# Patient Record
Sex: Female | Born: 1935 | Race: White | Hispanic: No | Marital: Single | Smoking: Never smoker
Health system: Southern US, Community
[De-identification: ages and names within clinical notes are randomized; demographics above are authoritative.]

## PROBLEM LIST (undated history)

## (undated) DIAGNOSIS — E669 Obesity, unspecified: Secondary | ICD-10-CM

## (undated) DIAGNOSIS — N200 Calculus of kidney: Secondary | ICD-10-CM

## (undated) DIAGNOSIS — M8580 Other specified disorders of bone density and structure, unspecified site: Secondary | ICD-10-CM

## (undated) DIAGNOSIS — E785 Hyperlipidemia, unspecified: Secondary | ICD-10-CM

## (undated) DIAGNOSIS — M2140 Flat foot [pes planus] (acquired), unspecified foot: Secondary | ICD-10-CM

## (undated) DIAGNOSIS — F439 Reaction to severe stress, unspecified: Secondary | ICD-10-CM

## (undated) DIAGNOSIS — M898X9 Other specified disorders of bone, unspecified site: Secondary | ICD-10-CM

## (undated) HISTORY — PX: KNEE SURGERY: SHX244

## (undated) HISTORY — DX: Hyperlipidemia, unspecified: E78.5

## (undated) HISTORY — DX: Obesity, unspecified: E66.9

## (undated) HISTORY — DX: Calculus of kidney: N20.0

## (undated) HISTORY — DX: Other specified disorders of bone, unspecified site: M89.8X9

## (undated) HISTORY — DX: Reaction to severe stress, unspecified: F43.9

## (undated) HISTORY — DX: Flat foot (pes planus) (acquired), unspecified foot: M21.40

## (undated) HISTORY — DX: Other specified disorders of bone density and structure, unspecified site: M85.80

---

## 1997-06-02 ENCOUNTER — Other Ambulatory Visit: Admission: RE | Admit: 1997-06-02 | Discharge: 1997-06-02 | Payer: Self-pay | Admitting: Obstetrics & Gynecology

## 1998-11-26 ENCOUNTER — Emergency Department (HOSPITAL_COMMUNITY): Admission: EM | Admit: 1998-11-26 | Discharge: 1998-11-26 | Payer: Self-pay | Admitting: Emergency Medicine

## 1999-01-19 ENCOUNTER — Encounter: Admission: RE | Admit: 1999-01-19 | Discharge: 1999-01-19 | Payer: Self-pay | Admitting: Neurosurgery

## 1999-12-23 ENCOUNTER — Encounter: Admission: RE | Admit: 1999-12-23 | Discharge: 1999-12-23 | Payer: Self-pay | Admitting: Internal Medicine

## 1999-12-23 ENCOUNTER — Encounter: Payer: Self-pay | Admitting: Internal Medicine

## 2000-03-20 ENCOUNTER — Ambulatory Visit (HOSPITAL_COMMUNITY): Admission: AD | Admit: 2000-03-20 | Discharge: 2000-03-20 | Payer: Self-pay | Admitting: Cardiology

## 2000-08-01 ENCOUNTER — Encounter: Admission: RE | Admit: 2000-08-01 | Discharge: 2000-08-01 | Payer: Self-pay | Admitting: Emergency Medicine

## 2000-08-01 ENCOUNTER — Encounter: Payer: Self-pay | Admitting: Emergency Medicine

## 2000-08-03 ENCOUNTER — Ambulatory Visit (HOSPITAL_COMMUNITY): Admission: RE | Admit: 2000-08-03 | Discharge: 2000-08-03 | Payer: Self-pay | Admitting: Urology

## 2000-08-03 ENCOUNTER — Encounter: Payer: Self-pay | Admitting: Urology

## 2001-05-02 ENCOUNTER — Ambulatory Visit (HOSPITAL_COMMUNITY): Admission: RE | Admit: 2001-05-02 | Discharge: 2001-05-02 | Payer: Self-pay | Admitting: Obstetrics & Gynecology

## 2001-05-02 ENCOUNTER — Encounter: Payer: Self-pay | Admitting: Obstetrics & Gynecology

## 2001-12-25 ENCOUNTER — Encounter: Admission: RE | Admit: 2001-12-25 | Discharge: 2001-12-25 | Payer: Self-pay | Admitting: Emergency Medicine

## 2001-12-25 ENCOUNTER — Encounter: Payer: Self-pay | Admitting: Emergency Medicine

## 2002-01-28 ENCOUNTER — Encounter: Admission: RE | Admit: 2002-01-28 | Discharge: 2002-01-28 | Payer: Self-pay | Admitting: Orthopedic Surgery

## 2002-01-28 ENCOUNTER — Encounter: Payer: Self-pay | Admitting: Orthopedic Surgery

## 2002-02-22 ENCOUNTER — Ambulatory Visit (HOSPITAL_BASED_OUTPATIENT_CLINIC_OR_DEPARTMENT_OTHER): Admission: RE | Admit: 2002-02-22 | Discharge: 2002-02-22 | Payer: Self-pay | Admitting: Orthopedic Surgery

## 2002-10-31 ENCOUNTER — Encounter: Admission: RE | Admit: 2002-10-31 | Discharge: 2003-01-29 | Payer: Self-pay | Admitting: Emergency Medicine

## 2003-05-08 ENCOUNTER — Other Ambulatory Visit: Admission: RE | Admit: 2003-05-08 | Discharge: 2003-05-08 | Payer: Self-pay | Admitting: Obstetrics & Gynecology

## 2003-07-07 ENCOUNTER — Ambulatory Visit (HOSPITAL_COMMUNITY): Admission: RE | Admit: 2003-07-07 | Discharge: 2003-07-07 | Payer: Self-pay | Admitting: Gastroenterology

## 2003-07-07 ENCOUNTER — Encounter (INDEPENDENT_AMBULATORY_CARE_PROVIDER_SITE_OTHER): Payer: Self-pay | Admitting: *Deleted

## 2004-06-25 ENCOUNTER — Ambulatory Visit: Payer: Self-pay | Admitting: Family Medicine

## 2004-08-05 ENCOUNTER — Encounter: Payer: Self-pay | Admitting: Family Medicine

## 2004-08-05 LAB — CONVERTED CEMR LAB

## 2004-08-26 ENCOUNTER — Other Ambulatory Visit: Admission: RE | Admit: 2004-08-26 | Discharge: 2004-08-26 | Payer: Self-pay | Admitting: Family Medicine

## 2004-08-26 ENCOUNTER — Ambulatory Visit: Payer: Self-pay | Admitting: Family Medicine

## 2004-11-29 ENCOUNTER — Ambulatory Visit: Payer: Self-pay | Admitting: Family Medicine

## 2005-01-12 ENCOUNTER — Ambulatory Visit: Payer: Self-pay | Admitting: Family Medicine

## 2005-01-14 ENCOUNTER — Ambulatory Visit: Payer: Self-pay | Admitting: Family Medicine

## 2005-02-09 ENCOUNTER — Ambulatory Visit: Payer: Self-pay | Admitting: Family Medicine

## 2005-02-17 ENCOUNTER — Ambulatory Visit: Payer: Self-pay | Admitting: Family Medicine

## 2005-04-01 ENCOUNTER — Ambulatory Visit: Payer: Self-pay | Admitting: Family Medicine

## 2005-07-08 ENCOUNTER — Ambulatory Visit: Payer: Self-pay | Admitting: Family Medicine

## 2005-07-15 ENCOUNTER — Ambulatory Visit: Payer: Self-pay | Admitting: Internal Medicine

## 2005-07-22 ENCOUNTER — Ambulatory Visit: Payer: Self-pay | Admitting: Family Medicine

## 2006-04-16 ENCOUNTER — Inpatient Hospital Stay (HOSPITAL_COMMUNITY): Admission: EM | Admit: 2006-04-16 | Discharge: 2006-04-17 | Payer: Self-pay | Admitting: Emergency Medicine

## 2006-04-17 ENCOUNTER — Ambulatory Visit: Payer: Self-pay | Admitting: Internal Medicine

## 2006-04-24 ENCOUNTER — Ambulatory Visit: Payer: Self-pay | Admitting: Family Medicine

## 2006-05-11 ENCOUNTER — Encounter: Payer: Self-pay | Admitting: Family Medicine

## 2006-05-11 DIAGNOSIS — R7303 Prediabetes: Secondary | ICD-10-CM | POA: Insufficient documentation

## 2006-05-11 DIAGNOSIS — E119 Type 2 diabetes mellitus without complications: Secondary | ICD-10-CM

## 2006-05-11 DIAGNOSIS — J309 Allergic rhinitis, unspecified: Secondary | ICD-10-CM | POA: Insufficient documentation

## 2006-05-11 DIAGNOSIS — J45909 Unspecified asthma, uncomplicated: Secondary | ICD-10-CM | POA: Insufficient documentation

## 2006-05-11 DIAGNOSIS — E785 Hyperlipidemia, unspecified: Secondary | ICD-10-CM | POA: Insufficient documentation

## 2006-06-19 ENCOUNTER — Ambulatory Visit: Payer: Self-pay | Admitting: Family Medicine

## 2006-06-19 LAB — CONVERTED CEMR LAB
ALT: 19 units/L (ref 0–40)
Albumin: 3.6 g/dL (ref 3.5–5.2)
BUN: 17 mg/dL (ref 6–23)
CO2: 27 meq/L (ref 19–32)
Calcium: 9.1 mg/dL (ref 8.4–10.5)
Chloride: 109 meq/L (ref 96–112)
Cholesterol: 183 mg/dL (ref 0–200)
Creatinine, Ser: 0.9 mg/dL (ref 0.4–1.2)
GFR calc non Af Amer: 66 mL/min
HDL: 41.9 mg/dL (ref >39.0)
LDL Cholesterol: 107 mg/dL — ABNORMAL HIGH (ref 0–99)
Total CHOL/HDL Ratio: 4.4
Triglycerides: 170 mg/dL — ABNORMAL HIGH (ref 0–149)
VLDL: 34 mg/dL (ref 0–40)

## 2006-09-14 ENCOUNTER — Ambulatory Visit: Payer: Self-pay | Admitting: Family Medicine

## 2006-09-14 DIAGNOSIS — R4589 Other symptoms and signs involving emotional state: Secondary | ICD-10-CM | POA: Insufficient documentation

## 2006-12-19 ENCOUNTER — Ambulatory Visit: Payer: Self-pay | Admitting: Family Medicine

## 2006-12-20 LAB — CONVERTED CEMR LAB
ALT: 16 units/L (ref 0–35)
HDL: 44.6 mg/dL (ref >39.0)
LDL Cholesterol: 128 mg/dL — ABNORMAL HIGH (ref 0–99)
Microalb Creat Ratio: 12.9 mg/g (ref 0.0–30.0)
Microalb, Ur: 1.6 mg/dL (ref 0.0–1.9)
Triglycerides: 123 mg/dL (ref 0–149)

## 2007-08-22 ENCOUNTER — Ambulatory Visit: Payer: Self-pay | Admitting: Family Medicine

## 2007-08-22 DIAGNOSIS — E6609 Other obesity due to excess calories: Secondary | ICD-10-CM | POA: Insufficient documentation

## 2007-08-22 DIAGNOSIS — E669 Obesity, unspecified: Secondary | ICD-10-CM

## 2007-08-22 DIAGNOSIS — E66811 Obesity, class 1: Secondary | ICD-10-CM | POA: Insufficient documentation

## 2007-11-21 ENCOUNTER — Ambulatory Visit: Payer: Self-pay | Admitting: Family Medicine

## 2007-11-21 LAB — CONVERTED CEMR LAB
ALT: 21 units/L (ref 0–35)
Albumin: 3.9 g/dL (ref 3.5–5.2)
Alkaline Phosphatase: 68 units/L (ref 39–117)
BUN: 16 mg/dL (ref 6–23)
Bilirubin, Direct: 0.1 mg/dL (ref 0.0–0.3)
Chloride: 112 meq/L (ref 96–112)
Creatinine, Ser: 1 mg/dL (ref 0.4–1.2)
GFR calc non Af Amer: 58 mL/min
Glucose, Bld: 101 mg/dL — ABNORMAL HIGH (ref 70–99)
HDL: 38.6 mg/dL — ABNORMAL LOW (ref >39.0)
Hgb A1c MFr Bld: 5.8 % (ref 4.6–6.0)
LDL Cholesterol: 113 mg/dL — ABNORMAL HIGH (ref 0–99)
Potassium: 4.6 meq/L (ref 3.5–5.1)
Total CHOL/HDL Ratio: 4.8
Total Protein: 6.7 g/dL (ref 6.0–8.3)
Triglycerides: 175 mg/dL — ABNORMAL HIGH (ref 0–149)

## 2007-11-26 ENCOUNTER — Ambulatory Visit: Payer: Self-pay | Admitting: Family Medicine

## 2008-03-07 LAB — HM MAMMOGRAPHY: HM Mammogram: NORMAL

## 2008-05-09 ENCOUNTER — Telehealth (INDEPENDENT_AMBULATORY_CARE_PROVIDER_SITE_OTHER): Payer: Self-pay | Admitting: *Deleted

## 2008-05-21 ENCOUNTER — Ambulatory Visit: Payer: Self-pay | Admitting: Family Medicine

## 2008-05-21 DIAGNOSIS — I10 Essential (primary) hypertension: Secondary | ICD-10-CM

## 2008-05-22 LAB — CONVERTED CEMR LAB
ALT: 19 units/L (ref 0–35)
Creatinine, Ser: 0.9 mg/dL (ref 0.4–1.2)
Direct LDL: 130.3 mg/dL
Hgb A1c MFr Bld: 5.9 % (ref 4.6–6.5)
Potassium: 4.6 meq/L (ref 3.5–5.1)
Triglycerides: 214 mg/dL — ABNORMAL HIGH (ref 0.0–149.0)
VLDL: 42.8 mg/dL — ABNORMAL HIGH (ref 0.0–40.0)

## 2008-05-26 ENCOUNTER — Ambulatory Visit: Payer: Self-pay | Admitting: Family Medicine

## 2008-05-26 DIAGNOSIS — M8580 Other specified disorders of bone density and structure, unspecified site: Secondary | ICD-10-CM

## 2008-07-15 ENCOUNTER — Ambulatory Visit: Payer: Self-pay | Admitting: Family Medicine

## 2008-08-28 ENCOUNTER — Encounter (INDEPENDENT_AMBULATORY_CARE_PROVIDER_SITE_OTHER): Payer: Self-pay | Admitting: *Deleted

## 2008-09-23 ENCOUNTER — Ambulatory Visit: Payer: Self-pay | Admitting: Family Medicine

## 2008-09-25 LAB — CONVERTED CEMR LAB
ALT: 21 units/L (ref 0–35)
Cholesterol: 193 mg/dL (ref 0–200)
Total CHOL/HDL Ratio: 6
VLDL: 42.4 mg/dL — ABNORMAL HIGH (ref 0.0–40.0)

## 2008-11-27 ENCOUNTER — Ambulatory Visit: Payer: Self-pay | Admitting: Family Medicine

## 2009-01-05 ENCOUNTER — Ambulatory Visit: Payer: Self-pay | Admitting: Family Medicine

## 2009-01-06 LAB — CONVERTED CEMR LAB
ALT: 24 units/L (ref 0–35)
AST: 30 units/L (ref 0–37)
Hgb A1c MFr Bld: 5.8 % (ref 4.6–6.5)
Total CHOL/HDL Ratio: 4
VLDL: 32.4 mg/dL (ref 0.0–40.0)

## 2009-04-10 ENCOUNTER — Ambulatory Visit: Payer: Self-pay | Admitting: Family Medicine

## 2009-08-06 ENCOUNTER — Ambulatory Visit: Payer: Self-pay | Admitting: Family Medicine

## 2009-08-06 DIAGNOSIS — J312 Chronic pharyngitis: Secondary | ICD-10-CM | POA: Insufficient documentation

## 2009-08-12 ENCOUNTER — Ambulatory Visit: Payer: Self-pay | Admitting: Family Medicine

## 2009-08-21 ENCOUNTER — Ambulatory Visit: Payer: Self-pay | Admitting: Psychology

## 2009-09-01 ENCOUNTER — Ambulatory Visit: Payer: Self-pay | Admitting: Psychology

## 2009-09-08 ENCOUNTER — Ambulatory Visit: Payer: Self-pay | Admitting: Psychology

## 2009-10-07 ENCOUNTER — Ambulatory Visit: Payer: Self-pay | Admitting: Family Medicine

## 2009-10-07 LAB — CONVERTED CEMR LAB
ALT: 22 units/L (ref 0–35)
AST: 26 units/L (ref 0–37)
Albumin: 3.7 g/dL (ref 3.5–5.2)
CO2: 27 meq/L (ref 19–32)
Creatinine,U: 136.3 mg/dL
GFR calc non Af Amer: 76.05 mL/min (ref >60)
Hgb A1c MFr Bld: 6 % (ref 4.6–6.5)
Microalb Creat Ratio: 1.5 mg/g (ref 0.0–30.0)
Phosphorus: 3.2 mg/dL (ref 2.3–4.6)
Potassium: 4.4 meq/L (ref 3.5–5.1)
Total CHOL/HDL Ratio: 4
Triglycerides: 177 mg/dL — ABNORMAL HIGH (ref 0.0–149.0)

## 2009-10-12 ENCOUNTER — Ambulatory Visit: Payer: Self-pay | Admitting: Family Medicine

## 2010-01-04 ENCOUNTER — Ambulatory Visit: Payer: Self-pay | Admitting: Family Medicine

## 2010-04-08 NOTE — Assessment & Plan Note (Signed)
Summary: ST,CONGESTION,COUGH/CLE   Vital Signs:  Patient profile:   75 year old female Weight:      192.50 pounds O2 Sat:      97 % on Room air Temp:     98.1 degrees F oral Pulse rate:   90 / minute Pulse rhythm:   regular BP sitting:   122 / 60  (left arm) Cuff size:   large  Vitals Entered By: Selena Batten Dance CMA Duncan Dull) (January 04, 2010 3:46 PM)  O2 Flow:  Room air CC: Sore Throat/Cough/Congestion   History of Present Illness: CC: cough/ST  3d h/o cough, ST, rhinorrhea, lost voice.  Thinks had fever last night.  Bad night last night, with cough.  Cough dry but feels needs to bring up.  Prolonged cough, worse at night.  + blowing nose, not much coming out.  + congestion and sinus pressure.  + diarrhea, resolved.  + malaise.  has tried cough drops (halls honey) and advil for headache.  No abd pain, n/v, myalgias, arthralgias.  No rashes.  No smokers at home, no sick contacts.  diabetic, HTN, HLD, osteoporosis  Current Medications (verified): 1)  Metformin Hcl 500 Mg  Tb24 (Metformin Hcl) .... Take One Half By Mouth Two Times A Day 2)  Vitamin D 1000 Unit  Tabs (Cholecalciferol) .... Take 1 Tablet By Mouth Once A Day 3)  Alendronate Sodium 70 Mg Tabs (Alendronate Sodium) .... One Tab By Mouth Once Weekly 4)  Zocor 20 Mg Tabs (Simvastatin) .... 1/2 By Mouth Once Daily in Evening With A Low Fat Snack 5)  Lisinopril 5 Mg Tabs (Lisinopril) .Marland Kitchen.. 1 By Mouth Once Daily in Am 6)  Glucometer .... To Check Glucose Once Daily and As Needed For Diabetes 250.0 7)  Glucose Test Strips .... To Check Sugar Once Daily and As Needed For Diabetes 250.0  Allergies: 1)  Lipitor  Past History:  Past Medical History: Last updated: 05/26/2008 Allergic rhinitis Asthma Diabetes mellitus, type II Hyperlipidemia obesity pes planus  bone loss- ? 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 congested, coughing. Head:  normocephalic, atraumatic, and no abnormalities observed.   Eyes:  vision grossly intact, pupils equal, pupils round, and pupils reactive to light.   Ears:  R ear normal and L ear normal, some fluid behind each.  - scant cerumen Nose:  no nasal discharge.   Mouth:  pharynx pink and moist.   Neck:  supple with full rom and no masses or thyromegally, no JVD or carotid bruit  Lungs:  Normal respiratory effort, chest expands symmetrically. Lungs are clear to auscultation, no crackles or wheezes. Heart:  Normal rate and regular rhythm. S1 and S2 normal without gallop, murmur, click, rub or other extra sounds. Pulses:  2+ rad pulses Extremities:  no edema Skin:  Intact without suspicious lesions or rashes   Impression & Recommendations:  Problem # 1:  VIRAL URI (ICD-465.9) likely viral laryngopharyngitis with bronchitis component.  only 3 days duration.  supportive care for now, red flags to return discussed.  if still sxs 7-8 days into illness, advised to call us for update, would consider zpack.  Her updated medication list for this problem includes:    Cheratussin Ac 100-10  Mg/88ml Syrp (Guaifenesin-codeine) ..... One teaspoon q6 hours as needed cough  Complete Medication List: 1)  Metformin Hcl 500 Mg Tb24 (Metformin hcl) .... Take one half by mouth two times a day 2)  Vitamin D 1000 Unit Tabs (Cholecalciferol) .... Take 1 tablet by mouth once a day 3)  Alendronate Sodium 70 Mg Tabs (Alendronate sodium) .... One tab by mouth once weekly 4)  Zocor 20 Mg Tabs (Simvastatin) .... 1/2 by mouth once daily in evening with a low fat snack 5)  Lisinopril 5 Mg Tabs (Lisinopril) .Marland Kitchen.. 1 by mouth once daily in am 6)  Glucometer  .... To check glucose once daily and as needed for diabetes 250.0 7)  Glucose Test Strips  ....  To check sugar once daily and as needed for diabetes 250.0 8)  Cheratussin Ac 100-10 Mg/52ml Syrp (Guaifenesin-codeine) .... One teaspoon q6 hours as needed cough  Patient Instructions: 1)  Sounds like you have a viral upper respiratory infection. 2)  Antibiotics are not needed for this.  Viral infections usually take 7-10 days to resolve.  The cough can last up to 4-6 weeks to go away. 3)  Guaifenesin IR 400mg  1 pill in am and at noon.  drink plenty of fluid with this.  Could try nasal saline spray. 4)  Use medication as prescribed: cheratussin for night use. 5)  Please return if you are not improving as expected, or if you have high fevers (>101.5) or difficulty swallowing. 6)  Call clinic with questions.  Pleasure to see you today. Prescriptions: CHERATUSSIN AC 100-10 MG/5ML SYRP (GUAIFENESIN-CODEINE) one teaspoon q6 hours as needed cough  #100cc x 0   Entered and Authorized by:   Eustaquio Boyden  MD   Signed by:   Eustaquio Boyden  MD on 01/04/2010   Method used:   Print then Give to Patient   RxID:   [id number]    Orders Added: 1)  Est. Patient Level III [95284]    Current Allergies (reviewed today): LIPITOR

## 2010-04-08 NOTE — Assessment & Plan Note (Signed)
Summary: 6 MONTH FOLLOW UP/[facility]   Vital Signs:  Patient profile:   75 year old female Weight:      192.75 pounds Temp:     97.8 degrees F oral Pulse rate:   76 / minute Pulse rhythm:   regular BP sitting:   119 / 70  (left arm) Cuff size:   large  Vitals Entered By: Selena Batten Dance CMA (AAMA) (October 12, 2009 2:05 PM)  Serial Vital Signs/Assessments:  Time      Position  BP       Pulse  Resp  Temp     By                     119/70                         Judith Part MD  CC: 6 month follow up   History of Present Illness: here for f/u of lipid and HTN and DM  is feeling ok - staying tired all the time  never gets any time to take care of herself  will start a dance exercise class tomorrow night     wt is stable  bp 136/80 on first check today   DM is stable with AIC of 6.0 (was 5.8) on metformin opthy --last monday in july (cataract as well ) - no DM damage at all  diet--slips up " a whole lot "  , but not eating sweets like she was - that is good  checks sugars about 3 times per week 133-140   lipids are a little better- LDL is 82 on zocor     Allergies: 1)  Lipitor  Past History:  Past Medical History: Last updated: 05/26/2008 Allergic rhinitis Asthma Diabetes mellitus, type II Hyperlipidemia obesity pes planus  bone loss- ? osteopenia   opthy- Dr Emily Filbert   Past Surgical History: Last updated: 05/26/2008 2003:  knee sx kidney stones fall 2005:  colonoscopy dexa with bone loss 1/10   Family History: Last updated: 05/11/2006 Father:  Mother: colon cancer Siblings: 0 gf:  heart probs. 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 Eyes:  Denies blurring and eye  irritation. CV:  Denies chest pain or discomfort, lightheadness, and palpitations. Resp:  Denies cough and wheezing. GI:  Denies abdominal pain, change in bowel habits, and indigestion. GU:  Denies urinary frequency. MS:  Denies joint pain, joint redness, and joint swelling. Derm:  Denies itching, lesion(s), poor wound healing, and rash. Neuro:  Denies numbness and tingling. Psych:  mood is ok. Endo:  Denies excessive thirst and excessive urination. Heme:  Denies abnormal bruising and bleeding.  Physical Exam  General:  overwt but well appearing   Head:  normocephalic, atraumatic, and no abnormalities observed.   Eyes:  vision grossly intact, pupils equal, pupils round, and pupils reactive to light.   Mouth:  pharynx pink and moist.   Neck:  supple with full rom and no masses or thyromegally, no JVD or carotid bruit  Lungs:  Normal respiratory effort, chest expands symmetrically. Lungs are clear to auscultation, no crackles or wheezes. Heart:  Normal rate and regular rhythm. S1 and S2 normal without gallop, murmur, click, rub  or other extra sounds. Abdomen:  Bowel sounds positive,abdomen soft and non-tender without masses, organomegaly or hernias noted. no renal bruits Msk:  No deformity or scoliosis noted of thoracic or lumbar spine.   Pulses:  R and L carotid,radial,femoral,dorsalis pedis and posterior tibial pulses are full and equal bilaterally Extremities:  No clubbing, cyanosis, edema, or deformity noted with normal full range of motion of all joints.   Neurologic:  sensation intact to light touch, gait normal, and DTRs symmetrical and normal.  no tremor  Skin:  Intact without suspicious lesions or rashes Cervical Nodes:  No lymphadenopathy noted Psych:  normal affect, talkative and pleasant   Diabetes Management Exam:    Foot Exam (with socks and/or shoes not present):       Sensory-Pinprick/Light touch:          Left medial foot (L-4): normal          Left dorsal foot  (L-5): normal          Left lateral foot (S-1): normal          Right medial foot (L-4): normal          Right dorsal foot (L-5): normal          Right lateral foot (S-1): normal       Sensory-Monofilament:          Left foot: normal          Right foot: normal       Inspection:          Left foot: normal          Right foot: normal       Nails:          Left foot: normal          Right foot: normal    Eye Exam:       Eye Exam done elsewhere          Date: 09/28/2009          Results: normal          Done by: ?    Impression & Recommendations:  Problem # 1:  ESSENTIAL HYPERTENSION, BENIGN (ICD-401.1) Assessment Unchanged  in good control on second check today enc exercise no change in medicines Her updated medication list for this problem includes:    Lisinopril 5 Mg Tabs (Lisinopril) .Marland Kitchen... 1 by mouth once daily in am  BP today: 119/70 Prior BP: 114/70 (08/06/2009)  Labs Reviewed: K+: 4.4 (10/07/2009) Creat: : 0.8 (10/07/2009)   Chol: 157 (10/07/2009)   HDL: 39.50 (10/07/2009)   LDL: 82 (10/07/2009)   TG: 177.0 (10/07/2009)  Problem # 2:  HYPERLIPIDEMIA (ICD-272.4) Assessment: Improved  has been controlled -in fact slt improved will re check 6 mo and f/u Her updated medication list for this problem includes:    Zocor 20 Mg Tabs (Simvastatin) .Marland Kitchen... 1/2 by mouth once daily in evening with a low fat snack  Labs Reviewed: SGOT: 26 (10/07/2009)   SGPT: 22 (10/07/2009)   HDL:39.50 (10/07/2009), 45.20 (01/05/2009)  LDL:82 (10/07/2009), 98 (01/05/2009)  Chol:157 (10/07/2009), 176 (01/05/2009)  Trig:177.0 (10/07/2009), 162.0 (01/05/2009)  Problem # 3:  DIABETES MELLITUS, TYPE II (ICD-250.00) Assessment: Unchanged  AIC of 6  on metformin counseled on diet  plans to start zumba classes urged to work on wt loss  lab and f/u 6 mo Her updated medication list for this problem includes:    Metformin Hcl 500 Mg Tb24 (Metformin hcl) .Marland Kitchen... Take one  half by mouth two times a  day    Lisinopril 5 Mg Tabs (Lisinopril) .Marland Kitchen... 1 by mouth once daily in am  Labs Reviewed: Creat: 0.8 (10/07/2009)     Last Eye Exam: normal (06/05/2008) Reviewed HgBA1c results: 6.0 (10/07/2009)  5.8 (01/05/2009)  Complete Medication List: 1)  Metformin Hcl 500 Mg Tb24 (Metformin hcl) .... Take one half by mouth two times a day 2)  Vitamin D 1000 Unit Tabs (Cholecalciferol) .... Take 1 tablet by mouth once a day 3)  Alendronate Sodium 70 Mg Tabs (Alendronate sodium) .... One tab by mouth once weekly 4)  Zocor 20 Mg Tabs (Simvastatin) .... 1/2 by mouth once daily in evening with a low fat snack 5)  Lisinopril 5 Mg Tabs (Lisinopril) .Marland Kitchen.. 1 by mouth once daily in am 6)  Glucometer  .... To check glucose once daily and as needed for diabetes 250.0 7)  Glucose Test Strips  .... To check sugar once daily and as needed for diabetes 250.0  Patient Instructions: 1)  schedule fasting labs and then checkup in 6 months (30 min)  2)  lipid/ast/alt/renal/ cbc with diff/ tsh/ AIc and vit D levels for 272, 250.0, 401.1 and 733.0 3)  continue working on healthy diet and exercise  Current Allergies (reviewed today): LIPITOR

## 2010-04-08 NOTE — Consult Note (Signed)
Summary: [facility]  [facility]   Imported By: Lanelle Bal 08/19/2009 13:13:41  _____________________________________________________________________  External Attachment:    Type:   Image     Comment:   External Document

## 2010-04-08 NOTE — Assessment & Plan Note (Signed)
Summary: F/U/CLE   Vital Signs:  Patient profile:   75 year old female Weight:      195 pounds BMI:     40.90 Temp:     97.9 degrees F oral Pulse rate:   68 / minute Pulse rhythm:   regular BP sitting:   130 / 80  (left arm) Cuff size:   large  Vitals Entered By: Lowella Petties CMA (April 10, 2009 11:35 AM) CC: follow-up visit   History of Present Illness: here for f/u of DM and lipids   is feeling good overall   things are going a little better at home   wt is up 6 lb this visit with bmi of 40 has been eating more - wants to sleep and eat  is not getting any exercise at all  plans to get a video and try   bp is up a bit on first check  lipid imp on zocor with trig 162/ HDL 45 an LDL 98   sugars well controlled with AIC 5.8 on metformin  diet - has really cut back on her sweets tremendously , but bread is still a problem  needs new glucometer - needs px  opthy up to date    Allergies: 1)  Lipitor  Past History:  Past Medical History: Last updated: 05/26/2008 Allergic rhinitis Asthma Diabetes mellitus, type II Hyperlipidemia obesity pes planus  bone loss- ? osteopenia   opthy- Dr Emily Filbert   Past Surgical History: Last updated: 05/26/2008 2003:  knee sx kidney stones fall 2005:  colonoscopy dexa with bone loss 1/10   Family History: Last updated: 05/11/2006 Father:  Mother: colon cancer Siblings: 0 gf:  heart probs. 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 Eyes:  Denies blurring and eye irritation. CV:  Denies chest pain or discomfort, palpitations, and shortness of breath with exertion. Resp:  Denies cough and wheezing. GI:  Denies abdominal pain, change  in bowel habits, and indigestion. GU:  Denies urinary frequency. MS:  Denies muscle aches and cramps. Derm:  Denies itching, lesion(s), poor wound healing, and rash. Neuro:  Denies numbness and tingling. Psych:  Denies anxiety and depression. Endo:  Denies excessive thirst and excessive urination. Heme:  Denies abnormal bruising and bleeding.  Physical Exam  General:  overwt but well app Head:  normocephalic, atraumatic, and no abnormalities observed.   Eyes:  vision grossly intact, pupils equal, pupils round, and pupils reactive to light.  no conjunctival pallor, injection or icterus  Neck:  supple with full rom and no masses or thyromegally, no JVD or carotid bruit  Lungs:  Normal respiratory effort, chest expands symmetrically. Lungs are clear to auscultation, no crackles or wheezes. Heart:  Normal rate and regular rhythm. S1 and S2 normal without gallop, murmur, click, rub or other extra sounds. Abdomen:  no renal bruits  Msk:  No deformity or scoliosis noted of thoracic or lumbar spine.   Pulses:  R and L carotid,radial,femoral,dorsalis pedis and posterior tibial pulses are full and equal bilaterally Extremities:  No clubbing, cyanosis, edema, or deformity noted with normal full range of motion of all joints.   Neurologic:  sensation intact to light touch, gait normal, and DTRs symmetrical and normal.   Skin:  Intact  without suspicious lesions or rashes Cervical Nodes:  No lymphadenopathy noted Psych:  Cognition and judgment appear intact. Alert and cooperative with normal attention span and concentration. No apparent delusions, illusions, hallucinations  Diabetes Management Exam:    Foot Exam (with socks and/or shoes not present):       Sensory-Pinprick/Light touch:          Left medial foot (L-4): normal          Left dorsal foot (L-5): normal          Left lateral foot (S-1): normal          Right medial foot (L-4): normal          Right dorsal foot (L-5): normal           Right lateral foot (S-1): normal       Sensory-Monofilament:          Left foot: normal          Right foot: normal       Inspection:          Left foot: normal          Right foot: normal       Nails:          Left foot: normal          Right foot: normal   Impression & Recommendations:  Problem # 1:  HYPERLIPIDEMIA (ICD-272.4) Assessment Improved  improved with low dose zocor- not at goal  will work harder on diet  re check in 6 mo and then f/u  may need to inc dose  Her updated medication list for this problem includes:    Zocor 20 Mg Tabs (Simvastatin) .Marland Kitchen... 1/2 by mouth once daily in evening with a low fat snack  Labs Reviewed: SGOT: 30 (01/05/2009)   SGPT: 24 (01/05/2009)   HDL:45.20 (01/05/2009), 34.90 (09/23/2008)  LDL:98 (01/05/2009), 113 (16/12/9602)  Chol:176 (01/05/2009), 193 (09/23/2008)  Trig:162.0 (01/05/2009), 212.0 (09/23/2008)  Problem # 2:  DIABETES MELLITUS, TYPE II (ICD-250.00) Assessment: Unchanged  overall excellent control continues add lisinopril for renal prot lab and f/u 6 mo  update if side eff disc healthy diet (low simple sugar/ choose complex carbs/ low sat fat) diet and exercise in detail  Her updated medication list for this problem includes:    Metformin Hcl 500 Mg Tb24 (Metformin hcl) .Marland Kitchen... Take one half by mouth two times a day    Lisinopril 5 Mg Tabs (Lisinopril) .Marland Kitchen... 1 by mouth once daily in am  Labs Reviewed: Creat: 0.9 (05/21/2008)     Last Eye Exam: normal (06/05/2008) Reviewed HgBA1c results: 5.8 (01/05/2009)  5.9 (05/21/2008)  Problem # 3:  ESSENTIAL HYPERTENSION, BENIGN (ICD-401.1) Assessment: Unchanged at goal with lisinopri, and habits  exercise encourated as tolerated Her updated medication list for this problem includes:    Lisinopril 5 Mg Tabs (Lisinopril) .Marland Kitchen... 1 by mouth once daily in am  BP today: 130/80 Prior BP: 120/70 (11/27/2008)  Labs Reviewed: K+: 4.6 (05/21/2008) Creat: : 0.9 (05/21/2008)   Chol:  176 (01/05/2009)   HDL: 45.20 (01/05/2009)   LDL: 98 (01/05/2009)   TG: 162.0 (01/05/2009)  Complete Medication List: 1)  Metformin Hcl 500 Mg Tb24 (Metformin hcl) .... Take one half by mouth two times a day 2)  Elocon 0.1 % Crea (Mometasone furoate) .... Apply to affected area once daily prn 3)  Vitamin D 1000 Unit Tabs (Cholecalciferol) .... Take 1 tablet by mouth once a day 4)  Alendronate  Sodium 70 Mg Tabs (Alendronate sodium) .... One tab by mouth once weekly 5)  Zocor 20 Mg Tabs (Simvastatin) .... 1/2 by mouth once daily in evening with a low fat snack 6)  Lisinopril 5 Mg Tabs (Lisinopril) .Marland Kitchen.. 1 by mouth once daily in am 7)  Glucometer  .... To check glucose once daily and as needed for diabetes 250.0 8)  Glucose Test Strips  .... To check sugar once daily and as needed for diabetes 250.0  Patient Instructions: 1)  try fish oil- start at 1000 mg per day and can increase up to three times a day (this will help raise your good cholesterol) 2)  It is important that you exercise reguarly at least 20 minutes 5 times a week. If you develop chest pain, have severe difficulty breathing, or feel very tired, stop exercising immediately and seek medical attention.  3)  there is a helpful book called "sugar busters" availible at most book stores  4)  start lisinopril one pill daily- this protects kidneys from diabetes  5)  update me if any problems or side effects  6)  I sent your px to your pharmacy  7)  schedule fasting lab in 6 mo and then follow up lipid/ast/alt/renal/ AIC / microalbumin 250.0, 272  Prescriptions: GLUCOSE TEST STRIPS to check sugar once daily and as needed for diabetes 250.0  #100 x 3   Entered and Authorized by:   Judith Part MD   Signed by:   Judith Part MD on 04/10/2009   Method used:   Print then Give to Patient   RxID:   [id number] GLUCOMETER to check glucose once daily and as needed for diabetes 250.0  #1 x 0   Entered and Authorized by:   Judith Part MD   Signed by:   Judith Part MD on 04/10/2009   Method used:   Print then Give to Patient   RxID:   [id number] LISINOPRIL 5 MG TABS (LISINOPRIL) 1 by mouth once daily in am  #90 x 3   Entered and Authorized by:   Judith Part MD   Signed by:   Judith Part MD on 04/10/2009   Method used:   Electronically to        [facility] [address] (retail)       [address]       [place], [place]  [address]       Ph: [phone]       Fax: [phone]   RxID:   [id number] ZOCOR 20 MG TABS (SIMVASTATIN) 1/2 by mouth once daily in evening with a low fat snack  #45 x 3   Entered and Authorized by:   Judith Part MD   Signed by:   Judith Part MD on 04/10/2009   Method used:   Electronically to        [facility] [address] (retail)       [address]       [place], [place]  [address]       Ph: [phone]       Fax: [phone]   RxID:   [id number] ALENDRONATE SODIUM 70 MG TABS (ALENDRONATE SODIUM) one tab by mouth once weekly  #12 x 3   Entered and Authorized by:   Judith Part MD   Signed by:   Judith Part MD on 04/10/2009   Method used:   Electronically to  [facility] [address] [address] (retail)       [address]       [place], [place]  [address]       Ph: [phone]       Fax: [phone]   RxID:   [id number] METFORMIN HCL 500 MG  TB24 (METFORMIN HCL) take one half by mouth two times a day  #90 x 3   Entered and Authorized by:   Judith Part MD   Signed by:   Judith Part MD on 04/10/2009   Method used:   Electronically to        [facility] [address] (retail)       [address]       [place], [place]  [address]       Ph: [phone]       Fax: [phone]   RxID:   [id number]   Prior Medications (reviewed today): METFORMIN HCL 500 MG  TB24 (METFORMIN HCL) take one half by mouth two times a day ELOCON 0.1 %  CREA (MOMETASONE FUROATE) apply to affected area once daily  prn VITAMIN D 1000 UNIT  TABS (CHOLECALCIFEROL) Take 1 tablet by mouth once a day ALENDRONATE SODIUM 70 MG TABS (ALENDRONATE SODIUM) one tab by mouth once weekly ZOCOR 20 MG TABS (SIMVASTATIN) 1/2 by mouth once daily in evening with a low fat snack LISINOPRIL 5 MG TABS (LISINOPRIL) 1 by mouth once daily in am GLUCOMETER () to check glucose once daily and as needed for diabetes 250.0 GLUCOSE TEST STRIPS () to check sugar once daily and as needed for diabetes 250.0 Current Allergies: LIPITOR   Influenza Immunization History:    Influenza # 1:  Fluvax 3+ (12/05/2008)

## 2010-04-08 NOTE — Miscellaneous (Signed)
Summary: Orders Update- for neck film   Clinical Lists Changes  Orders: Added new Test order of Diagnostic X-Ray/Fluoroscopy (Diagnostic X-Ray/Flu) - Signed
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
## 2010-04-09 ENCOUNTER — Other Ambulatory Visit (INDEPENDENT_AMBULATORY_CARE_PROVIDER_SITE_OTHER): Payer: MEDICARE

## 2010-04-09 ENCOUNTER — Encounter: Payer: Self-pay | Admitting: Family Medicine

## 2010-04-09 ENCOUNTER — Ambulatory Visit: Admit: 2010-04-09 | Payer: Self-pay | Admitting: Family Medicine

## 2010-04-09 ENCOUNTER — Ambulatory Visit: Payer: Self-pay

## 2010-04-09 ENCOUNTER — Other Ambulatory Visit: Payer: Self-pay | Admitting: Family Medicine

## 2010-04-09 ENCOUNTER — Encounter (INDEPENDENT_AMBULATORY_CARE_PROVIDER_SITE_OTHER): Payer: Self-pay | Admitting: *Deleted

## 2010-04-09 DIAGNOSIS — I10 Essential (primary) hypertension: Secondary | ICD-10-CM

## 2010-04-09 DIAGNOSIS — E119 Type 2 diabetes mellitus without complications: Secondary | ICD-10-CM

## 2010-04-09 DIAGNOSIS — E785 Hyperlipidemia, unspecified: Secondary | ICD-10-CM

## 2010-04-09 DIAGNOSIS — J309 Allergic rhinitis, unspecified: Secondary | ICD-10-CM

## 2010-04-09 DIAGNOSIS — M949 Disorder of cartilage, unspecified: Secondary | ICD-10-CM

## 2010-04-09 LAB — RENAL FUNCTION PANEL
Albumin: 3.7 g/dL (ref 3.5–5.2)
BUN: 19 mg/dL (ref 6–23)
CO2: 26 mEq/L (ref 19–32)
Chloride: 109 mEq/L (ref 96–112)
Creatinine, Ser: 0.9 mg/dL (ref 0.4–1.2)
Potassium: 4.8 mEq/L (ref 3.5–5.1)

## 2010-04-09 LAB — TSH: TSH: 3.65 u[IU]/mL (ref 0.35–5.50)

## 2010-04-09 LAB — AST: AST: 25 U/L (ref 0–37)

## 2010-04-09 LAB — CBC WITH DIFFERENTIAL/PLATELET
Basophils Relative: 0.7 % (ref 0.0–3.0)
Eosinophils Absolute: 0.3 10*3/uL (ref 0.0–0.7)
Eosinophils Relative: 4.1 % (ref 0.0–5.0)
HCT: 38.8 % (ref 36.0–46.0)
Hemoglobin: 13.5 g/dL (ref 12.0–15.0)
Lymphs Abs: 1.7 10*3/uL (ref 0.7–4.0)
WBC: 6.3 10*3/uL (ref 4.5–10.5)

## 2010-04-09 LAB — LIPID PANEL
Total CHOL/HDL Ratio: 4
VLDL: 21.4 mg/dL (ref 0.0–40.0)

## 2010-04-09 LAB — HEMOGLOBIN A1C: Hgb A1c MFr Bld: 6.1 % (ref 4.6–6.5)

## 2010-04-11 LAB — CONVERTED CEMR LAB: Vit D, 25-Hydroxy: 20 ng/mL — ABNORMAL LOW (ref 30–89)

## 2010-04-14 ENCOUNTER — Encounter: Payer: Self-pay | Admitting: Family Medicine

## 2010-04-14 ENCOUNTER — Ambulatory Visit (INDEPENDENT_AMBULATORY_CARE_PROVIDER_SITE_OTHER): Payer: MEDICARE | Admitting: Family Medicine

## 2010-04-14 DIAGNOSIS — E119 Type 2 diabetes mellitus without complications: Secondary | ICD-10-CM

## 2010-04-14 DIAGNOSIS — E559 Vitamin D deficiency, unspecified: Secondary | ICD-10-CM

## 2010-04-14 DIAGNOSIS — I10 Essential (primary) hypertension: Secondary | ICD-10-CM

## 2010-04-14 DIAGNOSIS — R4589 Other symptoms and signs involving emotional state: Secondary | ICD-10-CM

## 2010-04-14 DIAGNOSIS — E785 Hyperlipidemia, unspecified: Secondary | ICD-10-CM

## 2010-04-15 ENCOUNTER — Telehealth: Payer: Self-pay | Admitting: Family Medicine

## 2010-04-22 NOTE — Progress Notes (Signed)
Summary: regarding metformin  Phone Note From Pharmacy   Caller: Rosanne Ashing at [facility] Summary of Call: Pharmacist called to let you know that script for metformin extended release was sent in yesterday, but pt has always been on plain.  Advised ok to change script to plain, I will change in EMR if you say plain is correct. Initial call taken by: Lowella Petties CMA, AAMA,  April 15, 2010 11:52 AM  Follow-up for Phone Call        it is plain - please change that -- thanks   Follow-up by: Judith Part MD,  April 15, 2010 12:54 PM  Additional Follow-up for Phone Call Additional follow up Details #1::        Changed in EMR. Additional Follow-up by: Lowella Petties CMA, AAMA,  April 16, 2010 3:57 PM    New/Updated Medications: METFORMIN HCL 500 MG TABS (METFORMIN HCL) take one half by mouth two times a day.  Prior Medications: METFORMIN HCL 500 MG TABS (METFORMIN HCL) take one half by mouth two times a day. VITAMIN D 1000 UNIT  TABS (CHOLECALCIFEROL) Take total of 3000 international units daily ALENDRONATE SODIUM 70 MG TABS (ALENDRONATE SODIUM) one tab by mouth once weekly ZOCOR 20 MG TABS (SIMVASTATIN) 1/2 by mouth once daily in evening with a low fat snack LISINOPRIL 5 MG TABS (LISINOPRIL) 1 by mouth once daily in am GLUCOMETER () to check glucose once daily and as needed for diabetes 250.0 GLUCOSE TEST STRIPS () to check sugar once daily and as needed for diabetes 250.0 Current Allergies: LIPITOR
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
## 2010-07-23 NOTE — Cardiovascular Report (Signed)
[facility]  Patient:    Cindy Whitehead, Cindy Whitehead                        MRN: [id number] Proc. Date: 03/20/00 Attending:  Thereasa Solo. Little, M.D. CC:         Reuben Likes, M.D.             Catheterization Lab                        Cardiac Catheterization  PROCEDURES: 1. Left heart catheterization. 2. Selective right and left coronary arteriography. 3. Ventriculography in the RAO projection.  INDICATIONS FOR PROCEDURE:  [name] is a 75 year old female who has had intermittent chest pain both at rest and with exertion.  A nuclear study was suspicious for anterior ischemia versus breast induced artifact.  Because of continued chest pain despite medical therapy, she was brought in for outpatient cardiac catheterization.  DESCRIPTION OF PROCEDURE:  The patient was prepared and draped in the usual sterile fashion exposing the right groin.  Following local anesthetic with 1% Xylocaine, the Seldinger technique was employed, and a 6 [place] introducer sheath was placed in the right femoral artery.  Selective right and left coronary arteriography and ventriculography in the RAO projection were performed.  It took multiple attempts to enter the femoral artery.  The patient is obese but did not have a hematoma at the termination of the procedure.  EQUIPMENT:  6 French Judkins configuration catheters.  COMPLICATIONS:  None.  RESULTS: 1. Hemodynamic monitoring:  Central aortic pressure was 133/70, left    ventricular pressure was 133/14.  There was no aortic valve gradient noted    at the time of pullback. 2. Ventriculography:  Ventriculography in the RAO projection revealed    normal left ventricular systolic function.  There were no wall motion    abnormalities, no mitral regurgitation.  Ejection fraction calculated at    66%.  The end-diastolic pressure was 20.  CORONARY ARTERIOGRAPHY:  There was no calcification noted on fluoroscopy. 1. Left main:   Normal. 2. Left anterior descending:  The LAD gave rise to one diagonal branch.    These vessels were relatively small in diameter but free of disease. 3. Circumflex:  The circumflex gave rise to one OM vessel.  This too was small    in diameter with no significant disease. 4. Optional diagonal:  The optional diagonal was a large vessel in    length, relatively small in diameter, free of disease. 5. Right coronary arteryy:  The right coronary arter was a dominant    vessel giving rise to a PDA and a posterior lateral branch.  This    entire system was free of disease.  CONCLUSIONS: 1. No evidence of coronary artery disease. 2. Normal left ventricular systolic function.  DISCUSSION:  There is nothing to explain her chest pain.  I suspect that the abnormality on her nuclear study was breast induced artifact.  She has no hematoma at this time and should be ready for discharge later today.  She will follow up in my office tomorrow.  After this, the patient can undergo other evaluations for other etiologies of her chest pain. DD:  03/20/00 TD:  03/20/00 Job: [id number]

## 2010-07-23 NOTE — Op Note (Signed)
   NAME:  Cindy Whitehead, Cindy Whitehead                            ACCOUNT NO.:  [id number]   MEDICAL RECORD NO.:  [id number]                   PATIENT TYPE:  AMB   LOCATION:  DSC                                  FACILITY:  [facility]   PHYSICIAN:  Thera Flake., M.D.             DATE OF BIRTH:  07/06/1935   DATE OF PROCEDURE:  02/22/2002  DATE OF DISCHARGE:                                 OPERATIVE REPORT   PREOPERATIVE DIAGNOSES:  1. Complex radial tear, posterior horn medial meniscus, left knee.  2. Fraying-type tear, lateral meniscus.  3. Moderately severe chondromalacia of the patella, grade 3.   POSTOPERATIVE DIAGNOSES:  1. Complex radial tear, posterior horn medial meniscus, left knee.  2. Fraying-type tear, lateral meniscus.  3. Moderately severe chondromalacia of the patella, grade 3.   PROCEDURE:  1. Partial medial meniscectomy (30%).  2. Partial lateral meniscectomy (15-20%).  3. Extensive debridement of patellofemoral joint of grade 3 chondromalacia.   SURGEON:  Dyke Brackett, M.D.   ANESTHESIA:  General.   INDICATIONS:  A 75 year old female, MRI-proven medial meniscus tear thought  to be amenable to outpatient arthroscopy.   DESCRIPTION OF PROCEDURE:  She was arthroscoped through a superomedial,  inferomedial, and inferolateral portal.  Systematic inspection of the  patient's knee showed a grade 3 chondromalacia.  The majority of the patella  showed grade 3 changes, no grade 4, no maltracking.  Trochlear groove  spared.  The medial compartment showed very mild degenerative change, grade  2, complex tear posterior horn, flap tear medial meniscus, requiring about  resection of 30% of the meniscus substance but more than a rim could be left  posteriorly, again with the meniscectomy medially.  A fraying-type tear  requiring about 15-20% of resection of the lateral meniscus with mild  degenerative change of the lateral compartment debrided.  The knee drained  free of fluid.  Portals  closed with Marcaine.  Knee infiltrated with  Marcaine and morphine in addition to 40 mg Depo-Medrol.  Taken to the  recovery room in stable condition.                                               Thera Flake., M.D.    WDC/MEDQ  D:  02/22/2002  T:  02/24/2002  Job:  [id number]

## 2010-07-23 NOTE — H&P (Signed)
Cindy Whitehead, Cindy Whitehead                  ACCOUNT NO.:  [id number]   MEDICAL RECORD NO.:  [id number]          PATIENT TYPE:  INP   LOCATION:  0105                         FACILITY:  [facility]   PHYSICIAN:  Barbette Hair. Artist Pais, DO      DATE OF BIRTH:  November 23, 1935   DATE OF ADMISSION:  04/15/2006  DATE OF DISCHARGE:                              HISTORY & PHYSICAL   PRIMARY CARE PHYSICIAN:  Dr. Roxy Manns   CHIEF COMPLAINT:  Chest pain, shortness of breath.   HISTORY OF PRESENT ILLNESS:  The patient is a 75 year old white female  with a past medical history of morbid obesity, type 2 diabetes, who  presents with complaints of chest pain and dyspnea that started acutely  this evening.  The patient has had upper respiratory symptoms over the  last 3 days with cough and wheezing.  She has also had upper respiratory  nasal congestion and mild sore throat.  Daughter, who was accompanying  the patient today notes remote history of asthma.  She does not use  inhalers in the past.  The patient states the symptoms were triggered  when she was trying to go to sleep and as she laid down, chest felt  tight.  She denies any radiation of pain, no diaphoresis.  The patient  denies any previous exertional chest discomfort or chest tightness.  She  is noted to have elevated cholesterol readings managed through dietary  changes.   PAST MEDICAL HISTORY:  1. Type 2 diabetes.  2. Status post laparoscopic knee surgery.  3. History of colon polyps.  4. History of atypical chest pain with negative cardiac cath in      January 2002 with normal LV function.   SOCIAL HISTORY:  The patient is separated, denies any alcohol or tobacco  use.  She is retired from working at a [employer], trade [employer].  She currently  lives with her mother.   FAMILY HISTORY:  Mother is deceased, age 43, has colon cancer.  Father  died at age 69 due to old age.   As noted above, the patient denies any chronic heartburn symptoms.  All  other systems  negative.   LABORATORY DATA:  CBC showed WBC 7.2, H&H of 14.3, 41.3, platelet count  255.  BMET was notable for BUN 17 and creatinine 0.92.  Blood sugar was  114.  First set of cardiac enzymes were 0.05.  Chest x-ray showed  bronchiectatic changes with minimal atelectasis.   EKG shows normal sinus rhythm at 99 beats per minute with left anterior  fascicular block, no acute ST changes were noted.   CURRENT MEDICATIONS:  Metformin 500 mg twice daily.   ALLERGIES:  None known.   IMPRESSION:  1. Atypical chest pain, likely secondary to bronchitis.  2. Type 2 diabetes.  3. History of asthma.  4. Morbid obesity.   RECOMMENDATIONS:  The patient will be observed overnight.  We will cycle  cardiac enzymes, start azithromycin 500 mg IV daily, provide Tessalon  Perles t.i.d. p.r.n. and breathing treatments.   If she rules out with cardiac enzymes, the  patient is to follow up with  Dr. Milinda Antis.  I doubt that these symptoms are cardiac related; however,  the patient is still symptomatic after URI resolved, further testing may  be considered.      Barbette Hair. Artist Pais, DO  Electronically Signed     RDY/MEDQ  D:  04/16/2006  T:  04/16/2006  Job:  [id number]   cc:   Marne A. Tower, MD  [address] [place], [place] [address]

## 2010-07-23 NOTE — Discharge Summary (Signed)
Cindy Whitehead, Cindy Whitehead                  ACCOUNT NO.:  [id number]   MEDICAL RECORD NO.:  [id number]          PATIENT TYPE:  OBV   LOCATION:  1513                         FACILITY:  [facility]   PHYSICIAN:  Valerie A. Felicity Coyer, MDDATE OF BIRTH:  1936-03-05   DATE OF ADMISSION:  04/15/2006  DATE OF DISCHARGE:  04/17/2006                               DISCHARGE SUMMARY   DISCHARGE DIAGNOSES:  1. Atypical chest pain, rule out myocardial infarction negative,      likely secondary to coughing with acute bronchitis.  2. Acute bronchitis.  Continue empiric antibiotics.  3. History of asthma, no acute exacerbation.  4. Type 2 diabetes, well-controlled  5. Obesity.   DISCHARGE MEDICATIONS:  1. Azithromycin 500 mg once daily x3 additional days to complete 5-day      empiric course.  2. Tessalon Perles 100 mg 1 t.i.d. p.r.n. cough.  3. Albuterol MDI 2 puffs q.4 h p.r.n. shortness of breath.  Other medications are as prior to admission and include:  Metformin 500 mg b.i.d. the patient is also instructed she may take over-  the-counter cough syrup or cold remedy as needed for her cough and cold  symptoms.   CONDITION ON DISCHARGE:  Medically improved and stable, no residual  chest pain, minimal shortness of breath on exertion, cough controlled  with current medications, saturations 94% on room air.  Hospital  followup is with primary care physician Dr. Roxy Manns for later this  week, Thursday, February 14, at noon.  The patient is instructed to call  the office sooner if problems prior to that time or return to the  emergency room if needed.   HOSPITAL COURSE BY PROBLEM:  1. Atypical chest pain.  The patient is a 74 year old woman,      overweight, with history of diabetes, who came to the emergency      room due to shortness of breath.  She reports upper respiratory      symptoms over the 3 days prior with cough and wheezing but on the      evening of presentation, had suffered some increasing  chest pain      associated with her coughing.  She had history of remotely of      asthma but no current use of inhalers.  On evaluation in the      emergency room, her chest x-ray was unremarkable except for      bronchitic changes.  There was wheezing on exam but because of her      age with diabetes, the ER was concerned for possible anginal      equivalent, and she was thus referred for observation to rule out      MI.  Her EKG was generally unremarkable other than that of a left      anterior fascicular block, no acute ST changes.  Cardiac enzymes      were unremarkable except for a slight elevation of her CK with a      normal MB and troponin I at 0.02.  This was without change over the  next 2 sets.  The patient's chest pain did not recur during the      time of this hospitalization.  She was treated empirically for the      acute bronchitis with azithromycin and remained hemodynamically      stable and with no oxygen needs during this hospitalization.  After      an overnight observation at [facility], once her cardiac      enzymes had returned, and the patient's symptoms had improved, she      was felt stable for discharge with outpatient management of her      bronchitis symptoms.  She will continue azithromycin and Tessalon      for her upper respiratory symptoms and bronchitis, cough, with      outpatient followup later this week.  No other changes      were made in her regimen, and her diabetes remained stable this      hospitalization.  No need for steroids due to no acute bronchospasm      or respiratory needs.  The patient has been prescribed an inhaler      of albuterol to use p.r.n. shortness of breath.      Valerie A. Felicity Coyer, MD  Electronically Signed     VAL/MEDQ  D:  04/17/2006  T:  04/17/2006  Job:  [id number]

## 2010-07-23 NOTE — Op Note (Signed)
Cindy Whitehead, Cindy Whitehead                            ACCOUNT NO.:  [id number]   MEDICAL RECORD NO.:  [id number]                   PATIENT TYPE:  AMB   LOCATION:  ENDO                                 FACILITY:  [facility]   PHYSICIAN:  James L. Malon Kindle., M.D.          DATE OF BIRTH:  10-10-1935   DATE OF PROCEDURE:  07/07/2003  DATE OF DISCHARGE:                                 OPERATIVE REPORT   PROCEDURE:  Colonoscopy and coagulation of polyps.   ENDOSCOPIST:  Llana Aliment. Edwards, M.D.   MEDICATIONS:  Fentanyl 100 mcg, Versed 10 mg IV.   SCOPE:  Olympus pediatric adjustable colonoscope.   INDICATIONS:  Family history of colon cancer.   DESCRIPTION OF PROCEDURE:  The procedure had been explained to the patient  and consent obtained.  With the patient in the left lateral decubitus  position, the Olympus scope was inserted and advanced.  The prep was  excellent.  We were able to pass to the cecum.  The ileocecal valve and  appendiceal orifice were seen.  The scope was withdrawn from the cecum.  The  ascending colon, transverse colon were seen well.  In the mid descending  approximately 70 cm from the anal verge, a 3 mm sessile polyp on the fold  was cauterized.  No other polyps were seen in the descending colon.  The  sigmoid colon revealed marked diverticular disease with several impacted  diverticula.  At 30 cm from the anal verge, a 1.5 cm sessile polyp was seen  and cauterized and placed in jar #2.  The scope was withdrawn from the  rectum.  In the rectum, she was shown to have internal hemorrhoids.  No  rectal polyps were seen.  The scope was withdrawn.  The patient tolerated  the procedure well.  There were no immediate problems.   ASSESSMENT:  1. Colon polyps, cauterized.  (211.3)  2. Marked diverticular disease (562.10).  3. Family history of colon cancer (V16.0).   PLAN:  1. Routine post polypectomy instructions.  2. Will check pathology.  3. Will need repeat either in 3-5  years and will give her a diverticulosis     information sheet.                                               James L. Malon Kindle., M.D.    Waldron Session  D:  07/07/2003  T:  07/08/2003  Job:  [id number]

## 2010-09-29 LAB — HM DIABETES EYE EXAM: HM Diabetic Eye Exam: NORMAL

## 2010-10-06 ENCOUNTER — Telehealth: Payer: Self-pay | Admitting: Family Medicine

## 2010-10-06 DIAGNOSIS — E559 Vitamin D deficiency, unspecified: Secondary | ICD-10-CM

## 2010-10-06 DIAGNOSIS — M949 Disorder of cartilage, unspecified: Secondary | ICD-10-CM

## 2010-10-06 DIAGNOSIS — E785 Hyperlipidemia, unspecified: Secondary | ICD-10-CM

## 2010-10-06 DIAGNOSIS — E119 Type 2 diabetes mellitus without complications: Secondary | ICD-10-CM

## 2010-10-06 DIAGNOSIS — I10 Essential (primary) hypertension: Secondary | ICD-10-CM

## 2010-10-06 NOTE — Telephone Encounter (Signed)
Message copied by Judy Pimple on Wed Oct 06, 2010  8:45 AM ------      Message from: Baldomero Lamy      Created: Mon Oct 04, 2010  9:52 AM      Regarding: 6 mo f/u labs thurs       Please order  future 6 mo f/u labs for pt's upcomming lab appt.      Thanks      Rodney Booze

## 2010-10-07 ENCOUNTER — Other Ambulatory Visit (INDEPENDENT_AMBULATORY_CARE_PROVIDER_SITE_OTHER): Payer: Medicare Other

## 2010-10-07 ENCOUNTER — Telehealth: Payer: Self-pay | Admitting: Family Medicine

## 2010-10-07 DIAGNOSIS — E559 Vitamin D deficiency, unspecified: Secondary | ICD-10-CM

## 2010-10-07 DIAGNOSIS — I10 Essential (primary) hypertension: Secondary | ICD-10-CM

## 2010-10-07 DIAGNOSIS — M949 Disorder of cartilage, unspecified: Secondary | ICD-10-CM

## 2010-10-07 DIAGNOSIS — E785 Hyperlipidemia, unspecified: Secondary | ICD-10-CM

## 2010-10-07 DIAGNOSIS — E119 Type 2 diabetes mellitus without complications: Secondary | ICD-10-CM

## 2010-10-07 LAB — CBC WITH DIFFERENTIAL/PLATELET
Basophils Absolute: 0 10*3/uL (ref 0.0–0.1)
Eosinophils Relative: 3.7 % (ref 0.0–5.0)
HCT: 38.7 % (ref 36.0–46.0)
MCHC: 33.7 g/dL (ref 30.0–36.0)
MCV: 95.1 fl (ref 78.0–100.0)
Monocytes Relative: 6.6 % (ref 3.0–12.0)
Neutrophils Relative %: 62.4 % (ref 43.0–77.0)

## 2010-10-07 LAB — COMPREHENSIVE METABOLIC PANEL
Albumin: 4.1 g/dL (ref 3.5–5.2)
Alkaline Phosphatase: 35 U/L — ABNORMAL LOW (ref 39–117)
CO2: 25 mEq/L (ref 19–32)
Calcium: 8.9 mg/dL (ref 8.4–10.5)
Chloride: 108 mEq/L (ref 96–112)
Creatinine, Ser: 0.9 mg/dL (ref 0.4–1.2)
GFR: 69.69 mL/min (ref >60.00)
Potassium: 4.1 mEq/L (ref 3.5–5.1)
Sodium: 143 mEq/L (ref 135–145)
Total Protein: 6.7 g/dL (ref 6.0–8.3)

## 2010-10-07 LAB — HEMOGLOBIN A1C: Hgb A1c MFr Bld: 6 % (ref 4.6–6.5)

## 2010-10-07 LAB — TSH: TSH: 2.43 u[IU]/mL (ref 0.35–5.50)

## 2010-10-07 LAB — LIPID PANEL
Cholesterol: 138 mg/dL (ref 0–200)
Total CHOL/HDL Ratio: 3
VLDL: 33.6 mg/dL (ref 0.0–40.0)

## 2010-10-07 NOTE — Telephone Encounter (Signed)
Message copied by Judy Pimple on Thu Oct 07, 2010 11:30 PM ------      Message from: Baldomero Lamy      Created: Mon Oct 04, 2010  9:52 AM      Regarding: 6 mo f/u labs thurs       Please order  future 6 mo f/u labs for pt's upcomming lab appt.      Thanks      Rodney Booze

## 2010-10-08 LAB — VITAMIN D 25 HYDROXY (VIT D DEFICIENCY, FRACTURES): Vit D, 25-Hydroxy: 26 ng/mL — ABNORMAL LOW (ref 30–89)

## 2010-10-09 ENCOUNTER — Telehealth: Payer: Self-pay | Admitting: Family Medicine

## 2010-10-09 DIAGNOSIS — E559 Vitamin D deficiency, unspecified: Secondary | ICD-10-CM

## 2010-10-09 DIAGNOSIS — E785 Hyperlipidemia, unspecified: Secondary | ICD-10-CM

## 2010-10-09 DIAGNOSIS — M949 Disorder of cartilage, unspecified: Secondary | ICD-10-CM

## 2010-10-09 DIAGNOSIS — E119 Type 2 diabetes mellitus without complications: Secondary | ICD-10-CM

## 2010-10-09 DIAGNOSIS — I10 Essential (primary) hypertension: Secondary | ICD-10-CM

## 2010-10-09 NOTE — Telephone Encounter (Signed)
Future labs 

## 2010-10-12 ENCOUNTER — Encounter: Payer: Self-pay | Admitting: Family Medicine

## 2010-10-13 ENCOUNTER — Encounter: Payer: Self-pay | Admitting: Family Medicine

## 2010-10-13 ENCOUNTER — Ambulatory Visit (INDEPENDENT_AMBULATORY_CARE_PROVIDER_SITE_OTHER): Payer: Medicare Other | Admitting: Family Medicine

## 2010-10-13 VITALS — BP 108/70 | HR 88 | Temp 97.7°F | Ht <= 58 in | Wt 189.2 lb

## 2010-10-13 DIAGNOSIS — I10 Essential (primary) hypertension: Secondary | ICD-10-CM

## 2010-10-13 DIAGNOSIS — M899 Disorder of bone, unspecified: Secondary | ICD-10-CM

## 2010-10-13 DIAGNOSIS — R4589 Other symptoms and signs involving emotional state: Secondary | ICD-10-CM

## 2010-10-13 DIAGNOSIS — Z800 Family history of malignant neoplasm of digestive organs: Secondary | ICD-10-CM

## 2010-10-13 DIAGNOSIS — K219 Gastro-esophageal reflux disease without esophagitis: Secondary | ICD-10-CM | POA: Insufficient documentation

## 2010-10-13 DIAGNOSIS — E785 Hyperlipidemia, unspecified: Secondary | ICD-10-CM

## 2010-10-13 DIAGNOSIS — J312 Chronic pharyngitis: Secondary | ICD-10-CM

## 2010-10-13 DIAGNOSIS — E559 Vitamin D deficiency, unspecified: Secondary | ICD-10-CM

## 2010-10-13 DIAGNOSIS — E119 Type 2 diabetes mellitus without complications: Secondary | ICD-10-CM
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
## 2010-10-13 NOTE — Patient Instructions (Signed)
We will schedule colonoscopy at check out  Avoid red meat/ fried foods/ egg yolks/ fatty breakfast meats/ butter, cheese and high fat dairy/ and shellfish  -- are the things to watch for cholesterol If you are interested in shingles vaccine in future - call your insurance company to see how coverage is and call us to schedule  Don't forget to follow up with Dr Jennette Kettle for your gyn exam and mammogram and to discuss bone density test  Increase your vitamin D to 2000 international units daily - your level is still a little low at 26 and Dr Jennette Kettle may want to re check that  Follow up with your ENT doctor for sore throat since it is not improving (? If fosamax could cause acid reflux)  Follow up with me in 6 months with labs prior

## 2010-10-13 NOTE — Assessment & Plan Note (Signed)
Very good control  On ace - pt wants to stay on that  ? If that could perpetuate st or any cough / reflux - pt will address with her ENT

## 2010-10-13 NOTE — Assessment & Plan Note (Signed)
Level 26 Asked her to inc daily dose to 2000 iu  May end up needing weekly tx  She will disc with Dr Jennette Kettle

## 2010-10-13 NOTE — Assessment & Plan Note (Signed)
Overdue 5 y colonosc Will sched that with Dr Randa Evens

## 2010-10-13 NOTE — Assessment & Plan Note (Signed)
Followed by gyn Dr Jennette Kettle Pt will f/u  Vit D low- asked her to inc to 2000 iu day and have him re check Disc safety

## 2010-10-13 NOTE — Assessment & Plan Note (Signed)
Per ENT - likely reflux mediated  Pt stopped her med- ? Not working and cannot remb name She will f/u with ENT I wonder if ace or fosamax could play a role

## 2010-10-13 NOTE — Assessment & Plan Note (Signed)
Fair control but not optimal for DM  Will work on low sat fat diet (rev with pt) Also rev lab in detail with her  Will re check 6 mo and f/u

## 2010-10-13 NOTE — Assessment & Plan Note (Signed)
Very well controlled currently Enc better diet and more exercise and wt loss  F/u 6 mo with labs prior a1c is now 6.0--good! opthy utd
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
## 2010-11-04 ENCOUNTER — Encounter: Payer: Self-pay | Admitting: Family Medicine

## 2010-11-04 ENCOUNTER — Ambulatory Visit (INDEPENDENT_AMBULATORY_CARE_PROVIDER_SITE_OTHER): Payer: Medicare Other | Admitting: Family Medicine

## 2010-11-04 VITALS — BP 130/78 | HR 79 | Temp 97.9°F | Wt 190.0 lb

## 2010-11-04 DIAGNOSIS — B86 Scabies: Secondary | ICD-10-CM

## 2010-11-04 MED ORDER — HYDROXYZINE HCL 25 MG PO TABS: 25.0000 mg | ORAL_TABLET | Freq: Three times a day (TID) | ORAL | Status: AC | PRN

## 2010-11-04 MED ORDER — PERMETHRIN 5 % EX CREA: TOPICAL_CREAM | Freq: Once | CUTANEOUS | Status: AC

## 2010-11-04 NOTE — Patient Instructions (Signed)
Wash and dry recently used clothes, bedding.  Use the cream today, may repeat in 2 weeks if needed.  Use the hydroxyzine in the meantime for itching- sedation caution.

## 2010-11-04 NOTE — Assessment & Plan Note (Signed)
Topical tx with atarax prn.  D/w pt about washing clothes, etc.  She understood.  F/u prn.

## 2010-11-04 NOTE — Progress Notes (Signed)
Ichy rash.  "All over."  Last 2 nights the itching was bad, worst last night.  Less itching during the day.  She had tried on some clothes at a yard sale about 1 week ago. No changes in soap, perfume, foods, meds; no other new triggers.  No other people have similar sx.   No FCNAV.  Some diarrhea, at baseline.  No sputum.    Meds, vitals, and allergies reviewed.   ROS: See HPI.  Otherwise, noncontributory.  nad rrr ctab Skin with small blanching red excoriated linear lesions on the trunk and ext x4, none on face/scalp.

## 2010-11-22 ENCOUNTER — Encounter: Payer: Self-pay | Admitting: Family Medicine

## 2010-11-24 ENCOUNTER — Other Ambulatory Visit: Payer: Self-pay | Admitting: Family Medicine

## 2010-11-24 NOTE — Telephone Encounter (Signed)
[facility] electronically request refill Alendronate 70 mg. Pt seen 10/13/10 note that Dr Jennette Kettle was following osteopenia.Please advise.

## 2010-11-25 NOTE — Telephone Encounter (Signed)
Will refill electronically  

## 2010-11-25 NOTE — Telephone Encounter (Signed)
Patient notified as instructed by telephone. Pt said she has been busy taking care of her mother and has not seen the other doctor about her osteopenia yet and would like for Dr Milinda Antis to refill med.

## 2010-11-25 NOTE — Telephone Encounter (Signed)
Please ask her if he usually px that or me -thanks

## 2011-04-11 ENCOUNTER — Other Ambulatory Visit (INDEPENDENT_AMBULATORY_CARE_PROVIDER_SITE_OTHER): Payer: Medicare Other

## 2011-04-11 DIAGNOSIS — M949 Disorder of cartilage, unspecified: Secondary | ICD-10-CM

## 2011-04-11 DIAGNOSIS — I10 Essential (primary) hypertension: Secondary | ICD-10-CM

## 2011-04-11 DIAGNOSIS — E559 Vitamin D deficiency, unspecified: Secondary | ICD-10-CM

## 2011-04-11 DIAGNOSIS — E785 Hyperlipidemia, unspecified: Secondary | ICD-10-CM

## 2011-04-11 DIAGNOSIS — E119 Type 2 diabetes mellitus without complications: Secondary | ICD-10-CM

## 2011-04-11 LAB — LIPID PANEL
Cholesterol: 134 mg/dL (ref 0–200)
LDL Cholesterol: 65 mg/dL (ref 0–99)
Triglycerides: 122 mg/dL (ref 0.0–149.0)

## 2011-04-11 LAB — CBC WITH DIFFERENTIAL/PLATELET
Basophils Relative: 0.8 % (ref 0.0–3.0)
Eosinophils Absolute: 0.2 10*3/uL (ref 0.0–0.7)
Eosinophils Relative: 3.2 % (ref 0.0–5.0)
Hemoglobin: 13.7 g/dL (ref 12.0–15.0)
MCHC: 34.9 g/dL (ref 30.0–36.0)
MCV: 94.1 fl (ref 78.0–100.0)
Monocytes Absolute: 0.4 10*3/uL (ref 0.1–1.0)
Neutro Abs: 3.3 10*3/uL (ref 1.4–7.7)
RBC: 4.18 Mil/uL (ref 3.87–5.11)

## 2011-04-11 LAB — COMPREHENSIVE METABOLIC PANEL
AST: 30 U/L (ref 0–37)
Alkaline Phosphatase: 59 U/L (ref 39–117)
BUN: 25 mg/dL — ABNORMAL HIGH (ref 6–23)
Calcium: 9 mg/dL (ref 8.4–10.5)
Chloride: 108 mEq/L (ref 96–112)
Creatinine, Ser: 0.9 mg/dL (ref 0.4–1.2)
Glucose, Bld: 115 mg/dL — ABNORMAL HIGH (ref 70–99)

## 2011-04-11 LAB — TSH: TSH: 2.72 u[IU]/mL (ref 0.35–5.50)

## 2011-04-12 LAB — VITAMIN D 25 HYDROXY (VIT D DEFICIENCY, FRACTURES): Vit D, 25-Hydroxy: 17 ng/mL — ABNORMAL LOW (ref 30–89)

## 2011-04-15 ENCOUNTER — Ambulatory Visit (INDEPENDENT_AMBULATORY_CARE_PROVIDER_SITE_OTHER): Payer: Medicare Other | Admitting: Family Medicine

## 2011-04-15 ENCOUNTER — Encounter: Payer: Self-pay | Admitting: Family Medicine

## 2011-04-15 VITALS — BP 104/60 | HR 80 | Temp 98.0°F | Ht <= 58 in | Wt 188.5 lb

## 2011-04-15 DIAGNOSIS — I10 Essential (primary) hypertension: Secondary | ICD-10-CM

## 2011-04-15 DIAGNOSIS — E559 Vitamin D deficiency, unspecified: Secondary | ICD-10-CM

## 2011-04-15 DIAGNOSIS — E119 Type 2 diabetes mellitus without complications: Secondary | ICD-10-CM

## 2011-04-15 DIAGNOSIS — E669 Obesity, unspecified: Secondary | ICD-10-CM

## 2011-04-15 DIAGNOSIS — E785 Hyperlipidemia, unspecified: Secondary | ICD-10-CM

## 2011-04-15 MED ORDER — ERGOCALCIFEROL 1.25 MG (50000 UT) PO CAPS: 50000.0000 [IU] | ORAL_CAPSULE | ORAL | Status: AC

## 2011-04-15 NOTE — Assessment & Plan Note (Signed)
Stable a1c Disc diet and need for wt loss On ace  Good foot care utd opthy Will call about zostavax

## 2011-04-15 NOTE — Assessment & Plan Note (Signed)
bp in fair control at this time  No changes needed  Disc lifstyle change with low sodium diet and exercise   

## 2011-04-15 NOTE — Assessment & Plan Note (Signed)
Stable lipids on statin and diet  Disc goals for lipids and reasons to control them Rev labs with pt Rev low sat fat diet in detail  F/u 6 mo

## 2011-04-15 NOTE — Assessment & Plan Note (Signed)
This is low  Not taking - ? Why Will give 12 weeks of high dose ergocalciferol Also 1000 iu daily , plus ca and d tabs bid Disc imp for bone and overall health

## 2011-04-15 NOTE — Patient Instructions (Signed)
Take the px vit D one pill weekly for 12 weeks  Try to get 1200-1500 mg of calcium per day with at least 1000 iu of vitamin D - for bone health  Labs are stable Keep working on weight loss Get exercise videos- try for 5 days per week  Schedule annual exam in 6 months with labs prior

## 2011-04-15 NOTE — Assessment & Plan Note (Signed)
Discussed how this problem influences overall health and the risks it imposes  Reviewed plan for weight loss with lower calorie diet (via better food choices and also portion control or program like weight watchers) and exercise building up to or more than 30 minutes 5 days per week including some aerobic activity    
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
## 2011-05-05 ENCOUNTER — Other Ambulatory Visit: Payer: Self-pay | Admitting: Family Medicine

## 2011-05-05 NOTE — Telephone Encounter (Signed)
[facility] request lisinopril 5 mg #90 x 3 and Metformin 500 mg #90 x 3.

## 2011-06-06 ENCOUNTER — Other Ambulatory Visit: Payer: Self-pay | Admitting: Family Medicine

## 2011-10-05 ENCOUNTER — Telehealth: Payer: Self-pay | Admitting: Family Medicine

## 2011-10-05 DIAGNOSIS — E785 Hyperlipidemia, unspecified: Secondary | ICD-10-CM

## 2011-10-05 DIAGNOSIS — E559 Vitamin D deficiency, unspecified: Secondary | ICD-10-CM

## 2011-10-05 DIAGNOSIS — E119 Type 2 diabetes mellitus without complications: Secondary | ICD-10-CM

## 2011-10-05 DIAGNOSIS — M899 Disorder of bone, unspecified: Secondary | ICD-10-CM

## 2011-10-05 DIAGNOSIS — I10 Essential (primary) hypertension: Secondary | ICD-10-CM

## 2011-10-05 NOTE — Telephone Encounter (Signed)
Message copied by Judy Pimple on Wed Oct 05, 2011  8:13 PM ------      Message from: Alvina Chou      Created: Thu Sep 29, 2011  8:38 AM      Regarding: Lab orders for Thursday, 8.1.13       Patient is scheduled for CPX labs, please order future labs, Thanks , Camelia Eng

## 2011-10-06 ENCOUNTER — Other Ambulatory Visit (INDEPENDENT_AMBULATORY_CARE_PROVIDER_SITE_OTHER): Payer: Medicare Other

## 2011-10-06 DIAGNOSIS — E559 Vitamin D deficiency, unspecified: Secondary | ICD-10-CM

## 2011-10-06 DIAGNOSIS — M949 Disorder of cartilage, unspecified: Secondary | ICD-10-CM

## 2011-10-06 DIAGNOSIS — E785 Hyperlipidemia, unspecified: Secondary | ICD-10-CM

## 2011-10-06 DIAGNOSIS — I10 Essential (primary) hypertension: Secondary | ICD-10-CM

## 2011-10-06 DIAGNOSIS — E119 Type 2 diabetes mellitus without complications: Secondary | ICD-10-CM

## 2011-10-06 LAB — CBC WITH DIFFERENTIAL/PLATELET
Basophils Absolute: 0 10*3/uL (ref 0.0–0.1)
Basophils Relative: 0.8 % (ref 0.0–3.0)
Eosinophils Absolute: 0.2 10*3/uL (ref 0.0–0.7)
Hemoglobin: 13.1 g/dL (ref 12.0–15.0)
Lymphocytes Relative: 28.4 % (ref 12.0–46.0)
Lymphs Abs: 1.6 10*3/uL (ref 0.7–4.0)
MCHC: 33.5 g/dL (ref 30.0–36.0)
MCV: 94.8 fl (ref 78.0–100.0)
Monocytes Absolute: 0.4 10*3/uL (ref 0.1–1.0)
Neutro Abs: 3.5 10*3/uL (ref 1.4–7.7)
RDW: 12.6 % (ref 11.5–14.6)

## 2011-10-06 LAB — COMPREHENSIVE METABOLIC PANEL
ALT: 24 U/L (ref 0–35)
AST: 31 U/L (ref 0–37)
Alkaline Phosphatase: 45 U/L (ref 39–117)
BUN: 16 mg/dL (ref 6–23)
Chloride: 106 mEq/L (ref 96–112)
Creatinine, Ser: 0.8 mg/dL (ref 0.4–1.2)
Total Bilirubin: 0.8 mg/dL (ref 0.3–1.2)

## 2011-10-06 LAB — LIPID PANEL
Cholesterol: 144 mg/dL (ref 0–200)
HDL: 47.1 mg/dL (ref >39.00)
LDL Cholesterol: 65 mg/dL (ref 0–99)
Total CHOL/HDL Ratio: 3
Triglycerides: 159 mg/dL — ABNORMAL HIGH (ref 0.0–149.0)

## 2011-10-07 LAB — VITAMIN D 25 HYDROXY (VIT D DEFICIENCY, FRACTURES): Vit D, 25-Hydroxy: 30 ng/mL (ref 30–89)

## 2011-10-14 ENCOUNTER — Encounter: Payer: Self-pay | Admitting: Family Medicine

## 2011-10-14 ENCOUNTER — Ambulatory Visit (INDEPENDENT_AMBULATORY_CARE_PROVIDER_SITE_OTHER): Payer: Medicare Other | Admitting: Family Medicine

## 2011-10-14 VITALS — BP 118/69 | HR 81 | Temp 98.0°F | Ht 60.0 in | Wt 185.2 lb

## 2011-10-14 DIAGNOSIS — E785 Hyperlipidemia, unspecified: Secondary | ICD-10-CM

## 2011-10-14 DIAGNOSIS — I10 Essential (primary) hypertension: Secondary | ICD-10-CM

## 2011-10-14 DIAGNOSIS — E669 Obesity, unspecified: Secondary | ICD-10-CM

## 2011-10-14 DIAGNOSIS — Z23 Encounter for immunization: Secondary | ICD-10-CM

## 2011-10-14 DIAGNOSIS — E559 Vitamin D deficiency, unspecified: Secondary | ICD-10-CM

## 2011-10-14 DIAGNOSIS — M899 Disorder of bone, unspecified: Secondary | ICD-10-CM

## 2011-10-14 DIAGNOSIS — E119 Type 2 diabetes mellitus without complications: Secondary | ICD-10-CM

## 2011-10-14 DIAGNOSIS — Z2911 Encounter for prophylactic immunotherapy for respiratory syncytial virus (RSV): Secondary | ICD-10-CM

## 2011-10-14 NOTE — Assessment & Plan Note (Signed)
Discussed how this problem influences overall health and the risks it imposes  Reviewed plan for weight loss with lower calorie diet (via better food choices and also portion control or program like weight watchers) and exercise building up to or more than 30 minutes 5 days per week including some aerobic activity    

## 2011-10-14 NOTE — Patient Instructions (Addendum)
Labs are stable  No change in medicines except - please hold fosamax until you see Dr Jennette Kettle and talk about it (since you have been on it so long)  Shingles vaccine today  Make your own appt with Dr Jennette Kettle for your annual exam/ mammogram and bone density test  Follow up with me in 6 months with labs prior

## 2011-10-14 NOTE — Assessment & Plan Note (Signed)
On statin and diet Stable control Disc goals for lipids and reasons to control them Rev labs with pt Rev low sat fat diet in detail

## 2011-10-14 NOTE — Assessment & Plan Note (Signed)
Better with current supplementation

## 2011-10-14 NOTE — Assessment & Plan Note (Signed)
a1c is better Still need to work hard on diet/exercise and wt loss  opthy utd  Nl foot exam  F/u 6 mo
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
## 2011-10-14 NOTE — Assessment & Plan Note (Signed)
Pt will f/u Dr Jennette Kettle for her dexa Since on fosamax for longer than 5 years- I adv her to stop that Disc ca and D and exercise

## 2011-10-14 NOTE — Assessment & Plan Note (Signed)
bp in fair control at this time  No changes needed  Disc lifstyle change with low sodium diet and exercise  Labs reviewed Disc need for wt loss

## 2012-03-30 ENCOUNTER — Other Ambulatory Visit: Payer: Self-pay | Admitting: *Deleted

## 2012-04-09 ENCOUNTER — Telehealth: Payer: Self-pay | Admitting: Family Medicine

## 2012-04-09 DIAGNOSIS — E785 Hyperlipidemia, unspecified: Secondary | ICD-10-CM

## 2012-04-09 DIAGNOSIS — K219 Gastro-esophageal reflux disease without esophagitis: Secondary | ICD-10-CM

## 2012-04-09 DIAGNOSIS — M949 Disorder of cartilage, unspecified: Secondary | ICD-10-CM

## 2012-04-09 DIAGNOSIS — E119 Type 2 diabetes mellitus without complications: Secondary | ICD-10-CM

## 2012-04-09 DIAGNOSIS — E559 Vitamin D deficiency, unspecified: Secondary | ICD-10-CM

## 2012-04-09 DIAGNOSIS — I10 Essential (primary) hypertension: Secondary | ICD-10-CM

## 2012-04-09 NOTE — Telephone Encounter (Signed)
Message copied by Judy Pimple on Mon Apr 09, 2012 10:02 PM ------      Message from: Baldomero Lamy      Created: Fri Mar 30, 2012  1:31 PM      Regarding: Cpx labs Tues 04/10/12       Please order  future cpx labs for pt's upcoming lab appt.      Thanks      Rodney Booze

## 2012-04-10 ENCOUNTER — Telehealth: Payer: Self-pay | Admitting: Family Medicine

## 2012-04-10 ENCOUNTER — Other Ambulatory Visit: Payer: Medicare Other

## 2012-04-10 DIAGNOSIS — E785 Hyperlipidemia, unspecified: Secondary | ICD-10-CM

## 2012-04-10 DIAGNOSIS — E119 Type 2 diabetes mellitus without complications: Secondary | ICD-10-CM

## 2012-04-10 NOTE — Telephone Encounter (Signed)
Message copied by Judy Pimple on Tue Apr 10, 2012 10:06 PM ------      Message from: Alvina Chou      Created: Mon Apr 09, 2012 12:00 PM      Regarding: lab orders for Wednesday, 2.5.14       Labs for 6 month f/u

## 2012-04-11 ENCOUNTER — Other Ambulatory Visit (INDEPENDENT_AMBULATORY_CARE_PROVIDER_SITE_OTHER): Payer: Medicare Other

## 2012-04-11 DIAGNOSIS — K219 Gastro-esophageal reflux disease without esophagitis: Secondary | ICD-10-CM

## 2012-04-11 DIAGNOSIS — M899 Disorder of bone, unspecified: Secondary | ICD-10-CM

## 2012-04-11 DIAGNOSIS — E785 Hyperlipidemia, unspecified: Secondary | ICD-10-CM

## 2012-04-11 DIAGNOSIS — E119 Type 2 diabetes mellitus without complications: Secondary | ICD-10-CM

## 2012-04-11 DIAGNOSIS — E559 Vitamin D deficiency, unspecified: Secondary | ICD-10-CM

## 2012-04-11 DIAGNOSIS — I10 Essential (primary) hypertension: Secondary | ICD-10-CM

## 2012-04-11 LAB — CBC WITH DIFFERENTIAL/PLATELET
Basophils Absolute: 0 10*3/uL (ref 0.0–0.1)
Basophils Relative: 0.8 % (ref 0.0–3.0)
Eosinophils Relative: 3.2 % (ref 0.0–5.0)
HCT: 39.1 % (ref 36.0–46.0)
Hemoglobin: 13.3 g/dL (ref 12.0–15.0)
Lymphocytes Relative: 28.6 % (ref 12.0–46.0)
Lymphs Abs: 1.7 10*3/uL (ref 0.7–4.0)
Monocytes Relative: 6.2 % (ref 3.0–12.0)
Neutro Abs: 3.6 10*3/uL (ref 1.4–7.7)
RBC: 4.23 Mil/uL (ref 3.87–5.11)
RDW: 12.4 % (ref 11.5–14.6)
WBC: 5.9 10*3/uL (ref 4.5–10.5)

## 2012-04-11 LAB — COMPREHENSIVE METABOLIC PANEL
ALT: 20 U/L (ref 0–35)
CO2: 26 mEq/L (ref 19–32)
Calcium: 9.2 mg/dL (ref 8.4–10.5)
Chloride: 108 mEq/L (ref 96–112)
Creatinine, Ser: 0.9 mg/dL (ref 0.4–1.2)
GFR: 68.47 mL/min (ref >60.00)
Glucose, Bld: 114 mg/dL — ABNORMAL HIGH (ref 70–99)
Total Bilirubin: 0.6 mg/dL (ref 0.3–1.2)

## 2012-04-11 LAB — LIPID PANEL
Total CHOL/HDL Ratio: 4
Triglycerides: 179 mg/dL — ABNORMAL HIGH (ref 0.0–149.0)

## 2012-04-11 LAB — HEMOGLOBIN A1C: Hgb A1c MFr Bld: 6.1 % (ref 4.6–6.5)

## 2012-04-11 LAB — TSH: TSH: 3.8 u[IU]/mL (ref 0.35–5.50)

## 2012-04-16 ENCOUNTER — Ambulatory Visit: Payer: Medicare Other | Admitting: Family Medicine

## 2012-04-17 ENCOUNTER — Ambulatory Visit: Payer: Medicare Other | Admitting: Family Medicine

## 2012-05-07 ENCOUNTER — Other Ambulatory Visit: Payer: Self-pay | Admitting: Family Medicine

## 2012-05-23 ENCOUNTER — Ambulatory Visit (INDEPENDENT_AMBULATORY_CARE_PROVIDER_SITE_OTHER): Payer: Medicare Other | Admitting: Family Medicine

## 2012-05-23 ENCOUNTER — Encounter: Payer: Self-pay | Admitting: Family Medicine

## 2012-05-23 VITALS — BP 120/84 | HR 74 | Temp 98.2°F | Ht 60.0 in | Wt 182.0 lb

## 2012-05-23 DIAGNOSIS — F329 Major depressive disorder, single episode, unspecified: Secondary | ICD-10-CM

## 2012-05-23 MED ORDER — FLUOXETINE HCL 20 MG PO TABS: 20.0000 mg | ORAL_TABLET | Freq: Every day | ORAL | Status: DC
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
## 2012-05-23 NOTE — Patient Instructions (Signed)
F/u 3-4 weeks with Dr. Milinda Antis

## 2012-06-07 ENCOUNTER — Other Ambulatory Visit: Payer: Self-pay | Admitting: Family Medicine

## 2012-08-15 ENCOUNTER — Other Ambulatory Visit: Payer: Self-pay | Admitting: Family Medicine

## 2012-08-15 NOTE — Telephone Encounter (Signed)
done

## 2012-08-15 NOTE — Telephone Encounter (Signed)
Electronic refill request, no recent appt with you and no future appt, please advise

## 2012-08-15 NOTE — Telephone Encounter (Signed)
Please refill for 6 mo 

## 2012-10-13 ENCOUNTER — Other Ambulatory Visit: Payer: Self-pay | Admitting: Family Medicine

## 2013-01-22 ENCOUNTER — Ambulatory Visit (INDEPENDENT_AMBULATORY_CARE_PROVIDER_SITE_OTHER): Payer: Medicare Other | Admitting: Internal Medicine

## 2013-01-22 ENCOUNTER — Encounter: Payer: Self-pay | Admitting: Internal Medicine

## 2013-01-22 VITALS — BP 118/76 | HR 94 | Temp 97.9°F | Wt 192.8 lb

## 2013-01-22 DIAGNOSIS — W19XXXA Unspecified fall, initial encounter: Secondary | ICD-10-CM

## 2013-01-22 DIAGNOSIS — M549 Dorsalgia, unspecified: Secondary | ICD-10-CM

## 2013-01-22 DIAGNOSIS — Z23 Encounter for immunization: Secondary | ICD-10-CM

## 2013-01-22 DIAGNOSIS — E119 Type 2 diabetes mellitus without complications: Secondary | ICD-10-CM

## 2013-01-22 DIAGNOSIS — Y92009 Unspecified place in unspecified non-institutional (private) residence as the place of occurrence of the external cause: Secondary | ICD-10-CM

## 2013-01-22 MED ORDER — CYCLOBENZAPRINE HCL 5 MG PO TABS: 5.0000 mg | ORAL_TABLET | Freq: Three times a day (TID) | ORAL | Status: DC | PRN
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
## 2013-01-22 NOTE — Addendum Note (Signed)
Addended by: Criselda Peaches B on: 01/22/2013 12:01 PM   Modules accepted: Orders

## 2013-01-22 NOTE — Patient Instructions (Signed)

## 2013-01-22 NOTE — Progress Notes (Signed)
Pre-visit discussion using our clinic review tool. No additional management support is needed unless otherwise documented below in the visit note.  

## 2013-01-23 ENCOUNTER — Other Ambulatory Visit: Payer: Self-pay | Admitting: Family Medicine

## 2013-01-23 MED ORDER — GLUCOSE BLOOD VI STRP: ORAL_STRIP | Status: DC

## 2013-02-18 ENCOUNTER — Other Ambulatory Visit: Payer: Self-pay | Admitting: *Deleted

## 2013-02-18 MED ORDER — SIMVASTATIN 20 MG PO TABS: ORAL_TABLET | ORAL | Status: DC

## 2013-04-04 ENCOUNTER — Ambulatory Visit (INDEPENDENT_AMBULATORY_CARE_PROVIDER_SITE_OTHER): Payer: Medicare Other | Admitting: Internal Medicine

## 2013-04-04 ENCOUNTER — Encounter: Payer: Self-pay | Admitting: Internal Medicine

## 2013-04-04 VITALS — BP 108/70 | HR 87 | Temp 97.6°F | Wt 191.5 lb

## 2013-04-04 DIAGNOSIS — T148XXA Other injury of unspecified body region, initial encounter: Secondary | ICD-10-CM

## 2013-04-04 NOTE — Progress Notes (Signed)
Pre-visit discussion using our clinic review tool. No additional management support is needed unless otherwise documented below in the visit note.  

## 2013-04-04 NOTE — Patient Instructions (Signed)
Back Exercises These exercises may help you when beginning to rehabilitate your injury. Your symptoms may resolve with or without further involvement from your physician, physical therapist or athletic trainer. While completing these exercises, remember:   Restoring tissue flexibility helps normal motion to return to the joints. This allows healthier, less painful movement and activity.  An effective stretch should be held for at least 30 seconds.  A stretch should never be painful. You should only feel a gentle lengthening or release in the stretched tissue. STRETCH  Extension, Prone on Elbows   Lie on your stomach on the floor, a bed will be too soft. Place your palms about shoulder width apart and at the height of your head.  Place your elbows under your shoulders. If this is too painful, stack pillows under your chest.  Allow your body to relax so that your hips drop lower and make contact more completely with the floor.  Hold this position for __________ seconds.  Slowly return to lying flat on the floor. Repeat __________ times. Complete this exercise __________ times per day.  RANGE OF MOTION  Extension, Prone Press Ups   Lie on your stomach on the floor, a bed will be too soft. Place your palms about shoulder width apart and at the height of your head.  Keeping your back as relaxed as possible, slowly straighten your elbows while keeping your hips on the floor. You may adjust the placement of your hands to maximize your comfort. As you gain motion, your hands will come more underneath your shoulders.  Hold this position __________ seconds.  Slowly return to lying flat on the floor. Repeat __________ times. Complete this exercise __________ times per day.  RANGE OF MOTION- Quadruped, Neutral Spine   Assume a hands and knees position on a firm surface. Keep your hands under your shoulders and your knees under your hips. You may place padding under your knees for comfort.  Drop  your head and point your tail bone toward the ground below you. This will round out your low back like an angry cat. Hold this position for __________ seconds.  Slowly lift your head and release your tail bone so that your back sags into a large arch, like an old horse.  Hold this position for __________ seconds.  Repeat this until you feel limber in your low back.  Now, find your "sweet spot." This will be the most comfortable position somewhere between the two previous positions. This is your neutral spine. Once you have found this position, tense your stomach muscles to support your low back.  Hold this position for __________ seconds. Repeat __________ times. Complete this exercise __________ times per day.  STRETCH  Flexion, Single Knee to Chest   Lie on a firm bed or floor with both legs extended in front of you.  Keeping one leg in contact with the floor, bring your opposite knee to your chest. Hold your leg in place by either grabbing behind your thigh or at your knee.  Pull until you feel a gentle stretch in your low back. Hold __________ seconds.  Slowly release your grasp and repeat the exercise with the opposite side. Repeat __________ times. Complete this exercise __________ times per day.  STRETCH - Hamstrings, Standing  Stand or sit and extend your right / left leg, placing your foot on a chair or foot stool  Keeping a slight arch in your low back and your hips straight forward.  Lead with your chest and   lean forward at the waist until you feel a gentle stretch in the back of your right / left knee or thigh. (When done correctly, this exercise requires leaning only a small distance.)  Hold this position for __________ seconds. Repeat __________ times. Complete this stretch __________ times per day. STRENGTHENING  Deep Abdominals, Pelvic Tilt   Lie on a firm bed or floor. Keeping your legs in front of you, bend your knees so they are both pointed toward the ceiling and  your feet are flat on the floor.  Tense your lower abdominal muscles to press your low back into the floor. This motion will rotate your pelvis so that your tail bone is scooping upwards rather than pointing at your feet or into the floor.  With a gentle tension and even breathing, hold this position for __________ seconds. Repeat __________ times. Complete this exercise __________ times per day.  STRENGTHENING  Abdominals, Crunches   Lie on a firm bed or floor. Keeping your legs in front of you, bend your knees so they are both pointed toward the ceiling and your feet are flat on the floor. Cross your arms over your chest.  Slightly tip your chin down without bending your neck.  Tense your abdominals and slowly lift your trunk high enough to just clear your shoulder blades. Lifting higher can put excessive stress on the low back and does not further strengthen your abdominal muscles.  Control your return to the starting position. Repeat __________ times. Complete this exercise __________ times per day.  STRENGTHENING  Quadruped, Opposite UE/LE Lift   Assume a hands and knees position on a firm surface. Keep your hands under your shoulders and your knees under your hips. You may place padding under your knees for comfort.  Find your neutral spine and gently tense your abdominal muscles so that you can maintain this position. Your shoulders and hips should form a rectangle that is parallel with the floor and is not twisted.  Keeping your trunk steady, lift your right hand no higher than your shoulder and then your left leg no higher than your hip. Make sure you are not holding your breath. Hold this position __________ seconds.  Continuing to keep your abdominal muscles tense and your back steady, slowly return to your starting position. Repeat with the opposite arm and leg. Repeat __________ times. Complete this exercise __________ times per day. Document Released: 03/11/2005 Document  Revised: 05/16/2011 Document Reviewed: 06/05/2008 [employer] Patient Information 2014 [employer]  
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
## 2013-04-10 ENCOUNTER — Other Ambulatory Visit: Payer: Self-pay | Admitting: Family Medicine

## 2013-04-23 ENCOUNTER — Telehealth: Payer: Self-pay | Admitting: Family Medicine

## 2013-04-23 DIAGNOSIS — E785 Hyperlipidemia, unspecified: Secondary | ICD-10-CM

## 2013-04-23 DIAGNOSIS — M949 Disorder of cartilage, unspecified: Secondary | ICD-10-CM

## 2013-04-23 DIAGNOSIS — M899 Disorder of bone, unspecified: Secondary | ICD-10-CM

## 2013-04-23 DIAGNOSIS — E559 Vitamin D deficiency, unspecified: Secondary | ICD-10-CM

## 2013-04-23 DIAGNOSIS — I10 Essential (primary) hypertension: Secondary | ICD-10-CM

## 2013-04-23 DIAGNOSIS — E119 Type 2 diabetes mellitus without complications: Secondary | ICD-10-CM

## 2013-04-23 NOTE — Telephone Encounter (Signed)
Message copied by Abner Greenspan on Tue Apr 23, 2013  4:07 PM ------      Message from: Ellamae Sia      Created: Fri Apr 19, 2013 12:58 PM      Regarding: Lab orders for Wednesday, 2.18.15       Lab orders for a f/u appt ------

## 2013-04-24 ENCOUNTER — Other Ambulatory Visit: Payer: Medicare Other

## 2013-04-29 ENCOUNTER — Other Ambulatory Visit (INDEPENDENT_AMBULATORY_CARE_PROVIDER_SITE_OTHER): Payer: Medicare Other

## 2013-04-29 DIAGNOSIS — E119 Type 2 diabetes mellitus without complications: Secondary | ICD-10-CM

## 2013-04-29 DIAGNOSIS — E559 Vitamin D deficiency, unspecified: Secondary | ICD-10-CM

## 2013-04-29 DIAGNOSIS — E785 Hyperlipidemia, unspecified: Secondary | ICD-10-CM

## 2013-04-29 DIAGNOSIS — I10 Essential (primary) hypertension: Secondary | ICD-10-CM

## 2013-04-29 DIAGNOSIS — M899 Disorder of bone, unspecified: Secondary | ICD-10-CM

## 2013-04-29 DIAGNOSIS — M949 Disorder of cartilage, unspecified: Secondary | ICD-10-CM

## 2013-04-29 LAB — CBC WITH DIFFERENTIAL/PLATELET
BASOS ABS: 0.1 10*3/uL (ref 0.0–0.1)
BASOS PCT: 0.9 % (ref 0.0–3.0)
EOS ABS: 0.3 10*3/uL (ref 0.0–0.7)
Eosinophils Relative: 4.1 % (ref 0.0–5.0)
HCT: 41.5 % (ref 36.0–46.0)
Hemoglobin: 14 g/dL (ref 12.0–15.0)
Lymphocytes Relative: 21.1 % (ref 12.0–46.0)
Lymphs Abs: 1.5 10*3/uL (ref 0.7–4.0)
MCHC: 33.8 g/dL (ref 30.0–36.0)
MCV: 94.5 fl (ref 78.0–100.0)
MONO ABS: 0.3 10*3/uL (ref 0.1–1.0)
Monocytes Relative: 4.4 % (ref 3.0–12.0)
NEUTROS PCT: 69.5 % (ref 43.0–77.0)
Neutro Abs: 4.9 10*3/uL (ref 1.4–7.7)
PLATELETS: 249 10*3/uL (ref 150.0–400.0)
RBC: 4.39 Mil/uL (ref 3.87–5.11)
RDW: 12.3 % (ref 11.5–14.6)
WBC: 7 10*3/uL (ref 4.5–10.5)

## 2013-04-29 LAB — COMPREHENSIVE METABOLIC PANEL
ALBUMIN: 3.6 g/dL (ref 3.5–5.2)
ALK PHOS: 50 U/L (ref 39–117)
ALT: 27 U/L (ref 0–35)
AST: 30 U/L (ref 0–37)
BUN: 14 mg/dL (ref 6–23)
CO2: 21 mEq/L (ref 19–32)
Calcium: 9.3 mg/dL (ref 8.4–10.5)
Chloride: 108 mEq/L (ref 96–112)
Creatinine, Ser: 0.9 mg/dL (ref 0.4–1.2)
GFR: 62.38 mL/min (ref >60.00)
Glucose, Bld: 112 mg/dL — ABNORMAL HIGH (ref 70–99)
POTASSIUM: 4.3 meq/L (ref 3.5–5.1)
SODIUM: 139 meq/L (ref 135–145)
Total Bilirubin: 1 mg/dL (ref 0.3–1.2)
Total Protein: 6.9 g/dL (ref 6.0–8.3)

## 2013-04-29 LAB — LIPID PANEL
CHOL/HDL RATIO: 3
Cholesterol: 138 mg/dL (ref 0–200)
HDL: 40.8 mg/dL (ref >39.00)
LDL CALC: 71 mg/dL (ref 0–99)
TRIGLYCERIDES: 132 mg/dL (ref 0.0–149.0)
VLDL: 26.4 mg/dL (ref 0.0–40.0)

## 2013-04-29 LAB — HEMOGLOBIN A1C: Hgb A1c MFr Bld: 6.4 % (ref 4.6–6.5)

## 2013-04-29 LAB — TSH: TSH: 2.43 u[IU]/mL (ref 0.35–5.50)

## 2013-04-30 LAB — VITAMIN D 25 HYDROXY (VIT D DEFICIENCY, FRACTURES): Vit D, 25-Hydroxy: 26 ng/mL — ABNORMAL LOW (ref 30–89)

## 2013-05-01 ENCOUNTER — Ambulatory Visit (INDEPENDENT_AMBULATORY_CARE_PROVIDER_SITE_OTHER): Payer: Medicare Other | Admitting: Family Medicine

## 2013-05-01 ENCOUNTER — Encounter: Payer: Self-pay | Admitting: Family Medicine

## 2013-05-01 VITALS — BP 110/70 | HR 82 | Temp 97.8°F | Ht 60.0 in | Wt 189.2 lb

## 2013-05-01 DIAGNOSIS — E119 Type 2 diabetes mellitus without complications: Secondary | ICD-10-CM

## 2013-05-01 DIAGNOSIS — I10 Essential (primary) hypertension: Secondary | ICD-10-CM

## 2013-05-01 DIAGNOSIS — J209 Acute bronchitis, unspecified: Secondary | ICD-10-CM | POA: Insufficient documentation

## 2013-05-01 DIAGNOSIS — J40 Bronchitis, not specified as acute or chronic: Secondary | ICD-10-CM | POA: Insufficient documentation

## 2013-05-01 DIAGNOSIS — E785 Hyperlipidemia, unspecified: Secondary | ICD-10-CM

## 2013-05-01 DIAGNOSIS — E559 Vitamin D deficiency, unspecified: Secondary | ICD-10-CM

## 2013-05-01 MED ORDER — BENZONATATE 200 MG PO CAPS: 200.0000 mg | ORAL_CAPSULE | Freq: Three times a day (TID) | ORAL | Status: DC | PRN

## 2013-05-01 MED ORDER — SIMVASTATIN 20 MG PO TABS: ORAL_TABLET | ORAL | Status: DC

## 2013-05-01 MED ORDER — ALENDRONATE SODIUM 70 MG PO TABS: 70.0000 mg | ORAL_TABLET | ORAL | Status: DC

## 2013-05-01 MED ORDER — AZITHROMYCIN 250 MG PO TABS: ORAL_TABLET | ORAL | Status: DC

## 2013-05-01 NOTE — Progress Notes (Signed)
Pre visit review using our clinic review tool, if applicable. No additional management support is needed unless otherwise documented below in the visit note. 

## 2013-05-01 NOTE — Patient Instructions (Signed)
Take care of yourself-fluids and rest  Work on diet and exercise and weight loss when you feel better Start vitamin D 2000 iu daily over the counter for bone health Follow up in 6 months with labs prior
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
## 2013-05-02 NOTE — Assessment & Plan Note (Signed)
S/p influenza Will cover with zithromax due to length of illness  Disc symptomatic care - see instructions on AVS  Update if not starting to improve in a week or if worsening

## 2013-05-02 NOTE — Assessment & Plan Note (Signed)
Disc goals for lipids and reasons to control them Rev labs with pt Rev low sat fat diet in detail   

## 2013-05-02 NOTE — Assessment & Plan Note (Signed)
Lab Results  Component Value Date   HGBA1C 6.4 04/29/2013   Overall fairly stable and well controlled  Rev diet/ exercise and goals for wt loss

## 2013-05-02 NOTE — Assessment & Plan Note (Signed)
BP: 110/70 mmHg  bp in fair control at this time  No changes needed Disc lifstyle change with low sodium diet and exercise   Labs reviewed

## 2013-05-02 NOTE — Assessment & Plan Note (Signed)
D level is low -in face of low bone mass Will begin 2000 iu otc daily  Disc imp to bone and overall health

## 2013-05-03 ENCOUNTER — Telehealth: Payer: Self-pay | Admitting: Family Medicine

## 2013-05-03 NOTE — Telephone Encounter (Signed)
Relevant patient education mailed to patient.  

## 2013-05-27 ENCOUNTER — Other Ambulatory Visit: Payer: Self-pay | Admitting: Family Medicine

## 2013-05-27 NOTE — Telephone Encounter (Signed)
Pt left v/m requesting refill metformin;advised pt med refilled.

## 2013-07-31 ENCOUNTER — Other Ambulatory Visit: Payer: Self-pay | Admitting: Family Medicine

## 2013-08-01 ENCOUNTER — Other Ambulatory Visit: Payer: Self-pay | Admitting: Family Medicine

## 2013-08-01 MED ORDER — SIMVASTATIN 20 MG PO TABS: ORAL_TABLET | ORAL | Status: DC

## 2013-08-01 NOTE — Telephone Encounter (Signed)
Received fax from pharmacy saying pt wants to change Rx to a 3 month supply. Done

## 2013-08-05 ENCOUNTER — Other Ambulatory Visit: Payer: Self-pay | Admitting: Family Medicine

## 2013-10-21 ENCOUNTER — Telehealth: Payer: Self-pay | Admitting: Family Medicine

## 2013-10-21 DIAGNOSIS — E559 Vitamin D deficiency, unspecified: Secondary | ICD-10-CM

## 2013-10-21 DIAGNOSIS — E119 Type 2 diabetes mellitus without complications: Secondary | ICD-10-CM

## 2013-10-21 NOTE — Telephone Encounter (Signed)
Message copied by Abner Greenspan on Mon Oct 21, 2013  9:08 PM ------      Message from: Ellamae Sia      Created: Wed Oct 16, 2013  3:05 PM      Regarding: Lab orders for Tuesday, 8.18.15       Lab orders for a 3 month f/u ------

## 2013-10-22 ENCOUNTER — Other Ambulatory Visit (INDEPENDENT_AMBULATORY_CARE_PROVIDER_SITE_OTHER): Payer: Medicare Other

## 2013-10-22 DIAGNOSIS — M949 Disorder of cartilage, unspecified: Secondary | ICD-10-CM

## 2013-10-22 DIAGNOSIS — I10 Essential (primary) hypertension: Secondary | ICD-10-CM

## 2013-10-22 DIAGNOSIS — E119 Type 2 diabetes mellitus without complications: Secondary | ICD-10-CM

## 2013-10-22 DIAGNOSIS — E559 Vitamin D deficiency, unspecified: Secondary | ICD-10-CM

## 2013-10-22 DIAGNOSIS — E785 Hyperlipidemia, unspecified: Secondary | ICD-10-CM

## 2013-10-22 DIAGNOSIS — M899 Disorder of bone, unspecified: Secondary | ICD-10-CM

## 2013-10-22 LAB — HEMOGLOBIN A1C: Hgb A1c MFr Bld: 6.3 % (ref 4.6–6.5)

## 2013-10-22 LAB — VITAMIN D 25 HYDROXY (VIT D DEFICIENCY, FRACTURES): VITD: 28.94 ng/mL — ABNORMAL LOW (ref 30.00–100.00)

## 2013-10-29 ENCOUNTER — Encounter: Payer: Self-pay | Admitting: Family Medicine

## 2013-10-29 ENCOUNTER — Ambulatory Visit (INDEPENDENT_AMBULATORY_CARE_PROVIDER_SITE_OTHER): Payer: Medicare Other | Admitting: Family Medicine

## 2013-10-29 VITALS — BP 122/68 | HR 90 | Temp 98.2°F | Ht 60.0 in | Wt 195.2 lb

## 2013-10-29 DIAGNOSIS — E559 Vitamin D deficiency, unspecified: Secondary | ICD-10-CM

## 2013-10-29 DIAGNOSIS — E669 Obesity, unspecified: Secondary | ICD-10-CM

## 2013-10-29 DIAGNOSIS — F4323 Adjustment disorder with mixed anxiety and depressed mood: Secondary | ICD-10-CM | POA: Insufficient documentation

## 2013-10-29 DIAGNOSIS — E119 Type 2 diabetes mellitus without complications: Secondary | ICD-10-CM

## 2013-10-29 DIAGNOSIS — I10 Essential (primary) hypertension: Secondary | ICD-10-CM

## 2013-10-29 NOTE — Assessment & Plan Note (Signed)
Lab Results  Component Value Date   HGBA1C 6.3 10/22/2013   Well controlled Disc imp of wt loss Eye exam up to date  Made plan for indoor/outdoor exercise
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
## 2013-10-29 NOTE — Assessment & Plan Note (Signed)
Discussed how this problem influences overall health and the risks it imposes  Reviewed plan for weight loss with lower calorie diet (via better food choices and also portion control or program like weight watchers) and exercise building up to or more than 30 minutes 5 days per week including some aerobic activity   Found some opt for exercise in the house until the weather cools down to walk

## 2013-10-29 NOTE — Patient Instructions (Signed)
Please get vitamin D3 over the counter and take 2000 iu daily  Also get outdoors more When able - start some walking - ramp it up slowly  Also get a calcium plus D supplement take that twice daily  For mood- get out more/socialize/ force yourself to be more active/ check out volunteer options  If you are interested in counseling or in medication -please let me know  Diabetes is stable  Follow up in 6 months for annual exam with labs prior

## 2013-10-29 NOTE — Progress Notes (Signed)
Pre visit review using our clinic review tool, if applicable. No additional management support is needed unless otherwise documented below in the visit note. 

## 2013-10-29 NOTE — Assessment & Plan Note (Signed)
bp in fair control at this time  BP Readings from Last 1 Encounters:  10/29/13 122/68   No changes needed Disc lifstyle change with low sodium diet and exercise  Last labs rev

## 2013-10-29 NOTE — Assessment & Plan Note (Signed)
Not taking vit D Stressed imp of this for bone and overall health Will start ca plus D bid  Also 2000 iu D extra  Re check at annual exam

## 2013-10-29 NOTE — Assessment & Plan Note (Signed)
Reviewed stressors/ coping techniques/symptoms/ support sources/ tx options and side effects in detail today Tearful  Declines med or counseling She plans to get out more /socialize/volunteer and update me We disc opt for exercise

## 2013-11-24 ENCOUNTER — Other Ambulatory Visit: Payer: Self-pay | Admitting: Family Medicine

## 2014-02-03 ENCOUNTER — Ambulatory Visit (INDEPENDENT_AMBULATORY_CARE_PROVIDER_SITE_OTHER): Payer: Medicare Other | Admitting: Internal Medicine

## 2014-02-03 ENCOUNTER — Encounter: Payer: Self-pay | Admitting: Internal Medicine

## 2014-02-03 VITALS — BP 132/70 | HR 82 | Temp 98.3°F | Wt 197.0 lb

## 2014-02-03 DIAGNOSIS — M545 Low back pain, unspecified: Secondary | ICD-10-CM

## 2014-02-03 DIAGNOSIS — J069 Acute upper respiratory infection, unspecified: Secondary | ICD-10-CM

## 2014-02-03 DIAGNOSIS — B9789 Other viral agents as the cause of diseases classified elsewhere: Principal | ICD-10-CM

## 2014-02-03 MED ORDER — HYDROCODONE-HOMATROPINE 5-1.5 MG/5ML PO SYRP: 5.0000 mL | ORAL_SOLUTION | Freq: Three times a day (TID) | ORAL | Status: DC | PRN
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
## 2014-02-03 NOTE — Progress Notes (Signed)
Pre visit review using our clinic review tool, if applicable. No additional management support is needed unless otherwise documented below in the visit note. 

## 2014-02-03 NOTE — Patient Instructions (Signed)
Upper Respiratory Infection, Adult An upper respiratory infection (URI) is also sometimes known as the common cold. The upper respiratory tract includes the nose, sinuses, throat, trachea, and bronchi. Bronchi are the airways leading to the lungs. Most people improve within 1 week, but symptoms can last up to 2 weeks. A residual cough may last even longer.  CAUSES Many different viruses can infect the tissues lining the upper respiratory tract. The tissues become irritated and inflamed and often become very moist. Mucus production is also common. A cold is contagious. You can easily spread the virus to others by oral contact. This includes kissing, sharing a glass, coughing, or sneezing. Touching your mouth or nose and then touching a surface, which is then touched by another person, can also spread the virus. SYMPTOMS  Symptoms typically develop 1 to 3 days after you come in contact with a cold virus. Symptoms vary from person to person. They may include:  Runny nose.  Sneezing.  Nasal congestion.  Sinus irritation.  Sore throat.  Loss of voice (laryngitis).  Cough.  Fatigue.  Muscle aches.  Loss of appetite.  Headache.  Low-grade fever. DIAGNOSIS  You might diagnose your own cold based on familiar symptoms, since most people get a cold 2 to 3 times a year. Your caregiver can confirm this based on your exam. Most importantly, your caregiver can check that your symptoms are not due to another disease such as strep throat, sinusitis, pneumonia, asthma, or epiglottitis. Blood tests, throat tests, and X-rays are not necessary to diagnose a common cold, but they may sometimes be helpful in excluding other more serious diseases. Your caregiver will decide if any further tests are required. RISKS AND COMPLICATIONS  You may be at risk for a more severe case of the common cold if you smoke cigarettes, have chronic heart disease (such as heart failure) or lung disease (such as asthma), or if  you have a weakened immune system. The very young and very old are also at risk for more serious infections. Bacterial sinusitis, middle ear infections, and bacterial pneumonia can complicate the common cold. The common cold can worsen asthma and chronic obstructive pulmonary disease (COPD). Sometimes, these complications can require emergency medical care and may be life-threatening. PREVENTION  The best way to protect against getting a cold is to practice good hygiene. Avoid oral or hand contact with people with cold symptoms. Wash your hands often if contact occurs. There is no clear evidence that vitamin C, vitamin E, echinacea, or exercise reduces the chance of developing a cold. However, it is always recommended to get plenty of rest and practice good nutrition. TREATMENT  Treatment is directed at relieving symptoms. There is no cure. Antibiotics are not effective, because the infection is caused by a virus, not by bacteria. Treatment may include:  Increased fluid intake. Sports drinks offer valuable electrolytes, sugars, and fluids.  Breathing heated mist or steam (vaporizer or shower).  Eating chicken soup or other clear broths, and maintaining good nutrition.  Getting plenty of rest.  Using gargles or lozenges for comfort.  Controlling fevers with ibuprofen or acetaminophen as directed by your caregiver.  Increasing usage of your inhaler if you have asthma. Zinc gel and zinc lozenges, taken in the first 24 hours of the common cold, can shorten the duration and lessen the severity of symptoms. Pain medicines may help with fever, muscle aches, and throat pain. A variety of non-prescription medicines are available to treat congestion and runny nose. Your caregiver   can make recommendations and may suggest nasal or lung inhalers for other symptoms.  HOME CARE INSTRUCTIONS   Only take over-the-counter or prescription medicines for pain, discomfort, or fever as directed by your  caregiver.  Use a warm mist humidifier or inhale steam from a shower to increase air moisture. This may keep secretions moist and make it easier to breathe.  Drink enough water and fluids to keep your urine clear or pale yellow.  Rest as needed.  Return to work when your temperature has returned to normal or as your caregiver advises. You may need to stay home longer to avoid infecting others. You can also use a face mask and careful hand washing to prevent spread of the virus. SEEK MEDICAL CARE IF:   After the first few days, you feel you are getting worse rather than better.  You need your caregiver's advice about medicines to control symptoms.  You develop chills, worsening shortness of breath, or brown or red sputum. These may be signs of pneumonia.  You develop yellow or brown nasal discharge or pain in the face, especially when you bend forward. These may be signs of sinusitis.  You develop a fever, swollen neck glands, pain with swallowing, or white areas in the back of your throat. These may be signs of strep throat. SEEK IMMEDIATE MEDICAL CARE IF:   You have a fever.  You develop severe or persistent headache, ear pain, sinus pain, or chest pain.  You develop wheezing, a prolonged cough, cough up blood, or have a change in your usual mucus (if you have chronic lung disease).  You develop sore muscles or a stiff neck. Document Released: 08/17/2000 Document Revised: 05/16/2011 Document Reviewed: 05/29/2013 [employer] Patient Information 2015 [employer]. This information is not intended to replace advice given to you by your health care provider. Make sure you discuss any questions you have with your health care provider.  

## 2014-02-06 ENCOUNTER — Other Ambulatory Visit: Payer: Self-pay | Admitting: Internal Medicine

## 2014-02-06 ENCOUNTER — Telehealth: Payer: Self-pay

## 2014-02-06 MED ORDER — AZITHROMYCIN 250 MG PO TABS: ORAL_TABLET | ORAL | Status: DC

## 2014-02-06 NOTE — Telephone Encounter (Signed)
Pt is aware as instructed below 

## 2014-02-06 NOTE — Telephone Encounter (Signed)
Pt was seen 02/03/14; pt still has raw throat and is very hoarse; non prod cough constantly and Hycodan is not effective. Cough is deeper than when seen on 02/03/14 and pt having wheezing, worse during the night. Pt has chills, and slight SOB. No CP or fever. Pt wants to know if needs to be rechecked or can med be called in. [facility].

## 2014-02-06 NOTE — Telephone Encounter (Signed)
Will try a zpack, will call into her pharmacy

## 2014-03-12 ENCOUNTER — Other Ambulatory Visit: Payer: Self-pay

## 2014-03-12 DIAGNOSIS — B9789 Other viral agents as the cause of diseases classified elsewhere: Principal | ICD-10-CM

## 2014-03-12 DIAGNOSIS — J069 Acute upper respiratory infection, unspecified: Secondary | ICD-10-CM

## 2014-03-12 NOTE — Telephone Encounter (Signed)
She was seen for symptoms by Regina 11/30 I think- is she still having significant cough? If so , follow up

## 2014-03-12 NOTE — Telephone Encounter (Signed)
Pt left v/m requesting refill hydrocodone - homatropine.  Call when ready for pick up. Pt last seen 02/03/14.pt request cb.

## 2014-03-13 NOTE — Telephone Encounter (Signed)
Pt notified of Dr. Marliss Coots comments. appt scheduled with Dr. Glori Bickers tomorrow at 11:15am

## 2014-03-14 ENCOUNTER — Encounter: Payer: Self-pay | Admitting: Family Medicine

## 2014-03-14 ENCOUNTER — Ambulatory Visit (INDEPENDENT_AMBULATORY_CARE_PROVIDER_SITE_OTHER): Payer: Medicare Other | Admitting: Family Medicine

## 2014-03-14 VITALS — BP 106/64 | HR 70 | Temp 98.0°F | Ht 60.0 in | Wt 193.0 lb

## 2014-03-14 DIAGNOSIS — R051 Acute cough: Secondary | ICD-10-CM | POA: Insufficient documentation

## 2014-03-14 DIAGNOSIS — R059 Cough, unspecified: Secondary | ICD-10-CM

## 2014-03-14 DIAGNOSIS — R05 Cough: Secondary | ICD-10-CM | POA: Insufficient documentation

## 2014-03-14 DIAGNOSIS — J209 Acute bronchitis, unspecified: Secondary | ICD-10-CM | POA: Diagnosis not present

## 2014-03-14 MED ORDER — AZITHROMYCIN 250 MG PO TABS: ORAL_TABLET | ORAL | Status: DC

## 2014-03-14 MED ORDER — ALBUTEROL SULFATE HFA 108 (90 BASE) MCG/ACT IN AERS: 2.0000 | INHALATION_SPRAY | Freq: Four times a day (QID) | RESPIRATORY_TRACT | Status: DC | PRN

## 2014-03-14 MED ORDER — PREDNISONE 10 MG PO TABS: ORAL_TABLET | ORAL | Status: DC

## 2014-03-14 NOTE — Progress Notes (Signed)
Subjective:    Patient ID: Cindy Whitehead, female    DOB: August 12, 1935, 79 y.o.   MRN: [id number]  HPI Here with ongoing uri symptoms since November  Coughs day and night -until she gags  Throat is raw and "it hurts"  Also cannot sing in the choir   No fever No prod with cough as a rule  Always worse at night   Some wheezing   Is also on ace inhibitor  Has not caused cough before    Patient Active Problem List   Diagnosis Date Noted  . Adjustment disorder with mixed anxiety and depressed mood 10/29/2013  . Acute bronchitis 05/01/2013  . GERD (gastroesophageal reflux disease) 10/13/2010  . UNSPECIFIED VITAMIN D DEFICIENCY 04/14/2010  . OSTEOPENIA 05/26/2008  . ESSENTIAL HYPERTENSION, BENIGN 05/21/2008  . OBESITY 08/22/2007  . REACTION, ACUTE STRESS W/EMOTIONAL DSTURB 09/14/2006  . DIABETES MELLITUS, TYPE II 05/11/2006  . HYPERLIPIDEMIA 05/11/2006  . ALLERGIC RHINITIS 05/11/2006  . ASTHMA 05/11/2006   Past Medical History  Diagnosis Date  . Allergic rhinitis   . Asthma   . Diabetes mellitus   . Hyperlipidemia   . Obesity   . Pes planus   . Bone loss     ? osteopenia   . Stress     cares for mohter who is verbally abusive   . Kidney stones    Past Surgical History  Procedure Laterality Date  . Knee surgery     History  Substance Use Topics  . Smoking status: Never Smoker   . Smokeless tobacco: Not on file  . Alcohol Use: No   Family History  Problem Relation Age of Onset  . Colon cancer Mother    Allergies  Allergen Reactions  . Atorvastatin     REACTION: leg pain   Current Outpatient Prescriptions on File Prior to Visit  Medication Sig Dispense Refill  . glucose blood test strip Check blood sugar 3 times daily. 100 each 2  . lisinopril (PRINIVIL,ZESTRIL) 5 MG tablet TAKE ONE TABLET BY MOUTH EVERY MORNING 90 tablet 1  . metFORMIN (GLUCOPHAGE) 500 MG tablet TAKE ONE HALF TABLET BY MOUTH TWICE DAILY 90 tablet 1  . simvastatin (ZOCOR) 20 MG tablet 1/2  tablet by mouth every evening with a lowfat snack 45 tablet 3   No current facility-administered medications on file prior to visit.    Review of Systems Review of Systems  Constitutional: Negative for fever, appetite change, and unexpected weight change.  ENT pos for cong and rhinorrhea and drip  Eyes: Negative for pain and visual disturbance.  Respiratory: Negative for  shortness of breath.  pos for cough and wheeze  Cardiovascular: Negative for cp or palpitations    Gastrointestinal: Negative for nausea, diarrhea and constipation.  Genitourinary: Negative for urgency and frequency.  Skin: Negative for pallor or rash   Neurological: Negative for weakness, light-headedness, numbness and headaches.  Hematological: Negative for adenopathy. Does not bruise/bleed easily.  Psychiatric/Behavioral: Negative for dysphoric mood. The patient is not nervous/anxious.         Objective:   Physical Exam  Constitutional: She appears well-developed and well-nourished. No distress.  HENT:  Head: Normocephalic and atraumatic.  Right Ear: External ear normal.  Left Ear: External ear normal.  Mouth/Throat: No oropharyngeal exudate.  Nares are boggy  Some clear rhinorrhea  No sinus tenderness   Eyes: Conjunctivae and EOM are normal. Pupils are equal, round, and reactive to light. Right eye exhibits no discharge. Left eye exhibits  no discharge.  Neck: Normal range of motion. Neck supple.  Cardiovascular: Normal rate and regular rhythm.   Pulmonary/Chest: Effort normal. No respiratory distress. She has wheezes. She has no rales. She exhibits no tenderness.  Diffuse exp wheezes slt prolonged exp phase Few rhonchi  No rales   Musculoskeletal: She exhibits no edema.  Lymphadenopathy:    She has no cervical adenopathy.  Neurological: She is alert.  Skin: Skin is warm and dry. No rash noted.  Psychiatric: She has a normal mood and affect.          Assessment & Plan:   Problem List Items  Addressed This Visit      Respiratory   Acute bronchitis with bronchospasm - Primary    In pt with hx of asthma  pred taper -aware this will inc her glucose readings  zpak as directed  Disc symptomatic care - see instructions on AVS  Update if not starting to improve in a week or if worsening    refiled albuterol inhaler as well       Other   Cough    Ongoing  Will hold ace for 1-2 wk and update If improved-will change to ARB

## 2014-03-14 NOTE — Assessment & Plan Note (Signed)
Ongoing  Will hold ace for 1-2 wk and update If improved-will change to ARB

## 2014-03-14 NOTE — Patient Instructions (Signed)
Hold your lisinopril because this may worsen cough- let me know in 1-2 weeks how cough is and we may change to something else Take prednisone as directed for wheezing - it will raise blood sugar temporarily  Take zithromax for bronchitis Drink fluids and rest your voice    Update if not starting to improve in a week or if worsening

## 2014-03-14 NOTE — Assessment & Plan Note (Signed)
In pt with hx of asthma  pred taper -aware this will inc her glucose readings  zpak as directed  Disc symptomatic care - see instructions on AVS  Update if not starting to improve in a week or if worsening    refiled albuterol inhaler as well

## 2014-03-14 NOTE — Progress Notes (Signed)
Pre visit review using our clinic review tool, if applicable. No additional management support is needed unless otherwise documented below in the visit note. 

## 2014-04-26 ENCOUNTER — Encounter (HOSPITAL_COMMUNITY): Payer: Self-pay

## 2014-04-26 ENCOUNTER — Emergency Department (HOSPITAL_COMMUNITY): Payer: Medicare Other

## 2014-04-26 ENCOUNTER — Emergency Department (HOSPITAL_COMMUNITY)
Admission: EM | Admit: 2014-04-26 | Discharge: 2014-04-26 | Disposition: A | Discharge status: Home / Self Care | Payer: Medicare Other | Attending: Emergency Medicine | Admitting: Emergency Medicine

## 2014-04-26 ENCOUNTER — Emergency Department (HOSPITAL_COMMUNITY)
Admission: EM | Admit: 2014-04-26 | Discharge: 2014-04-26 | Disposition: A | Discharge status: Nursing Home (Includes ICF) | Payer: Medicare Other | Source: Home / Self Care | Attending: Family Medicine | Admitting: Family Medicine

## 2014-04-26 ENCOUNTER — Encounter (HOSPITAL_COMMUNITY): Payer: Self-pay | Admitting: *Deleted

## 2014-04-26 DIAGNOSIS — R509 Fever, unspecified: Secondary | ICD-10-CM

## 2014-04-26 DIAGNOSIS — R109 Unspecified abdominal pain: Secondary | ICD-10-CM | POA: Diagnosis present

## 2014-04-26 DIAGNOSIS — E785 Hyperlipidemia, unspecified: Secondary | ICD-10-CM | POA: Insufficient documentation

## 2014-04-26 DIAGNOSIS — R112 Nausea with vomiting, unspecified: Secondary | ICD-10-CM | POA: Insufficient documentation

## 2014-04-26 DIAGNOSIS — R1084 Generalized abdominal pain: Secondary | ICD-10-CM | POA: Diagnosis not present

## 2014-04-26 DIAGNOSIS — Z7952 Long term (current) use of systemic steroids: Secondary | ICD-10-CM | POA: Diagnosis not present

## 2014-04-26 DIAGNOSIS — Z8739 Personal history of other diseases of the musculoskeletal system and connective tissue: Secondary | ICD-10-CM | POA: Insufficient documentation

## 2014-04-26 DIAGNOSIS — E119 Type 2 diabetes mellitus without complications: Secondary | ICD-10-CM | POA: Insufficient documentation

## 2014-04-26 DIAGNOSIS — Z792 Long term (current) use of antibiotics: Secondary | ICD-10-CM | POA: Diagnosis not present

## 2014-04-26 DIAGNOSIS — R1032 Left lower quadrant pain: Secondary | ICD-10-CM | POA: Diagnosis not present

## 2014-04-26 DIAGNOSIS — K760 Fatty (change of) liver, not elsewhere classified: Secondary | ICD-10-CM | POA: Diagnosis not present

## 2014-04-26 DIAGNOSIS — Z87442 Personal history of urinary calculi: Secondary | ICD-10-CM | POA: Insufficient documentation

## 2014-04-26 DIAGNOSIS — E669 Obesity, unspecified: Secondary | ICD-10-CM | POA: Diagnosis not present

## 2014-04-26 DIAGNOSIS — J45909 Unspecified asthma, uncomplicated: Secondary | ICD-10-CM | POA: Insufficient documentation

## 2014-04-26 DIAGNOSIS — N390 Urinary tract infection, site not specified: Secondary | ICD-10-CM | POA: Insufficient documentation

## 2014-04-26 DIAGNOSIS — Z79899 Other long term (current) drug therapy: Secondary | ICD-10-CM | POA: Insufficient documentation

## 2014-04-26 DIAGNOSIS — B9689 Other specified bacterial agents as the cause of diseases classified elsewhere: Secondary | ICD-10-CM | POA: Diagnosis not present

## 2014-04-26 DIAGNOSIS — K8020 Calculus of gallbladder without cholecystitis without obstruction: Secondary | ICD-10-CM | POA: Diagnosis not present

## 2014-04-26 DIAGNOSIS — R1031 Right lower quadrant pain: Secondary | ICD-10-CM | POA: Diagnosis not present

## 2014-04-26 DIAGNOSIS — R1030 Lower abdominal pain, unspecified: Secondary | ICD-10-CM

## 2014-04-26 LAB — CBC WITH DIFFERENTIAL/PLATELET
BASOS ABS: 0 10*3/uL (ref 0.0–0.1)
BASOS PCT: 0 % (ref 0–1)
Eosinophils Absolute: 0 10*3/uL (ref 0.0–0.7)
Eosinophils Relative: 1 % (ref 0–5)
HCT: 41.8 % (ref 36.0–46.0)
HEMOGLOBIN: 14.3 g/dL (ref 12.0–15.0)
LYMPHS PCT: 12 % (ref 12–46)
Lymphs Abs: 0.8 10*3/uL (ref 0.7–4.0)
MCH: 31.9 pg (ref 26.0–34.0)
MCHC: 34.2 g/dL (ref 30.0–36.0)
MCV: 93.3 fL (ref 78.0–100.0)
MONOS PCT: 4 % (ref 3–12)
Monocytes Absolute: 0.3 10*3/uL (ref 0.1–1.0)
NEUTROS PCT: 83 % — AB (ref 43–77)
Neutro Abs: 5.7 10*3/uL (ref 1.7–7.7)
Platelets: 214 10*3/uL (ref 150–400)
RBC: 4.48 MIL/uL (ref 3.87–5.11)
RDW: 12.5 % (ref 11.5–15.5)
WBC: 6.8 10*3/uL (ref 4.0–10.5)

## 2014-04-26 LAB — URINE MICROSCOPIC-ADD ON

## 2014-04-26 LAB — COMPREHENSIVE METABOLIC PANEL
ALBUMIN: 3.5 g/dL (ref 3.5–5.2)
ALT: 27 U/L (ref 0–35)
AST: 49 U/L — ABNORMAL HIGH (ref 0–37)
Alkaline Phosphatase: 53 U/L (ref 39–117)
Anion gap: 9 (ref 5–15)
BUN: 20 mg/dL (ref 6–23)
CHLORIDE: 104 mmol/L (ref 96–112)
CO2: 24 mmol/L (ref 19–32)
Calcium: 8.9 mg/dL (ref 8.4–10.5)
Creatinine, Ser: 1.04 mg/dL (ref 0.50–1.10)
GFR calc Af Amer: 59 mL/min — ABNORMAL LOW (ref >90)
GFR calc non Af Amer: 51 mL/min — ABNORMAL LOW (ref >90)
Glucose, Bld: 124 mg/dL — ABNORMAL HIGH (ref 70–99)
POTASSIUM: 4.2 mmol/L (ref 3.5–5.1)
Sodium: 137 mmol/L (ref 135–145)
Total Bilirubin: 0.9 mg/dL (ref 0.3–1.2)
Total Protein: 7 g/dL (ref 6.0–8.3)

## 2014-04-26 LAB — URINALYSIS, ROUTINE W REFLEX MICROSCOPIC
Glucose, UA: NEGATIVE mg/dL
HGB URINE DIPSTICK: NEGATIVE
Ketones, ur: 15 mg/dL — AB
Nitrite: POSITIVE — AB
PH: 5.5 (ref 5.0–8.0)
Protein, ur: 30 mg/dL — AB
Specific Gravity, Urine: >1.03 — ABNORMAL HIGH (ref 1.005–1.030)
Urobilinogen, UA: 1 mg/dL (ref 0.0–1.0)

## 2014-04-26 LAB — I-STAT CG4 LACTIC ACID, ED: Lactic Acid, Venous: 2.5 mmol/L (ref 0.5–2.0)

## 2014-04-26 LAB — LIPASE, BLOOD: Lipase: 25 U/L (ref 11–59)

## 2014-04-26 MED ORDER — CEPHALEXIN 500 MG PO CAPS: 500.0000 mg | ORAL_CAPSULE | Freq: Three times a day (TID) | ORAL | Status: DC

## 2014-04-26 MED ORDER — SODIUM CHLORIDE 0.9 % IV BOLUS (SEPSIS)
500.0000 mL | Freq: Once | INTRAVENOUS | Status: AC
  Administered 2014-04-26: 500 mL via INTRAVENOUS

## 2014-04-26 MED ORDER — MORPHINE SULFATE 4 MG/ML IJ SOLN
4.0000 mg | Freq: Once | INTRAMUSCULAR | Status: AC
  Administered 2014-04-26: 4 mg via INTRAVENOUS
  Filled 2014-04-26: qty 1

## 2014-04-26 MED ORDER — IOHEXOL 300 MG/ML  SOLN
25.0000 mL | Freq: Once | INTRAMUSCULAR | Status: AC | PRN
  Administered 2014-04-26: 25 mL via ORAL

## 2014-04-26 MED ORDER — IOHEXOL 300 MG/ML  SOLN
100.0000 mL | Freq: Once | INTRAMUSCULAR | Status: AC | PRN
  Administered 2014-04-26: 100 mL via INTRAVENOUS

## 2014-04-26 MED ORDER — DEXTROSE 5 % IV SOLN
1.0000 g | Freq: Once | INTRAVENOUS | Status: AC
  Administered 2014-04-26: 1 g via INTRAVENOUS
  Filled 2014-04-26: qty 10

## 2014-04-26 MED ORDER — ONDANSETRON 4 MG PO TBDP
8.0000 mg | ORAL_TABLET | Freq: Once | ORAL | Status: AC
  Administered 2014-04-26: 8 mg via ORAL
  Filled 2014-04-26: qty 2

## 2014-04-26 NOTE — ED Notes (Signed)
Pt  Reports   abd   Pain             X  2  Days         Better  Last         Night   Worse  Now                No     Vomiting         Today   Some  Nausea           No  Diarrhea       The  Pain      Is  In lower     abd  No  Urinary  Symptoms

## 2014-04-26 NOTE — ED Provider Notes (Signed)
CSN: [id number]     Arrival date & time 04/26/14  1736 History   First MD Initiated Contact with Patient 04/26/14 1800     Chief Complaint  Patient presents with  . Abdominal Pain     (Consider location/radiation/quality/duration/timing/severity/associated sxs/prior Treatment) The history is provided by the patient and medical records.    This is a 79 y.o. F with PMH significant for HLP, DM, asthma, kidney stones, presenting to the ED from urgent care for abdominal pain.  Patient states pain began 3 days ago and has been progressively getting worse.  Patient states is intermittent, localized to bilateral lower quadrants when it occurs associated with nausea and vomiting.  Last emesis was yesterday. States there is also somewhat of a "burning sensation on the inside".  She denies any dysuria, hematuria, or vaginal issues.  No flank pain.  BMs have been regular, no melena or hematochezia.  She denies fever or chills at home.  Has had decreased appetite but has continued drinking fluids regularly.  Prior abdominal surgeries include hysterectomy.  Patient has had colonoscopy approx 4 years ago, was normal at that time.  Past Medical History  Diagnosis Date  . Allergic rhinitis   . Asthma   . Diabetes mellitus   . Hyperlipidemia   . Obesity   . Pes planus   . Bone loss     ? osteopenia   . Stress     cares for mohter who is verbally abusive   . Kidney stones    Past Surgical History  Procedure Laterality Date  . Knee surgery     Family History  Problem Relation Age of Onset  . Colon cancer Mother    History  Substance Use Topics  . Smoking status: Never Smoker   . Smokeless tobacco: Not on file  . Alcohol Use: No   OB History    No data available     Review of Systems  Gastrointestinal: Positive for nausea, vomiting and abdominal pain.  All other systems reviewed and are negative.     Allergies  Atorvastatin  Home Medications   Prior to Admission medications    Medication Sig Start Date End Date Taking? Authorizing Provider  albuterol (PROVENTIL HFA;VENTOLIN HFA) 108 (90 BASE) MCG/ACT inhaler Inhale 2 puffs into the lungs every 6 (six) hours as needed for wheezing or shortness of breath. 03/14/14   Abner Greenspan, MD  azithromycin (ZITHROMAX Z-PAK) 250 MG tablet Take 2 pills by mouth today and then 1 pill daily for 4 days 03/14/14   Abner Greenspan, MD  glucose blood test strip Check blood sugar 3 times daily. 01/23/13   Jearld Fenton, NP  lisinopril (PRINIVIL,ZESTRIL) 5 MG tablet TAKE ONE TABLET BY MOUTH EVERY MORNING 08/05/13   Abner Greenspan, MD  metFORMIN (GLUCOPHAGE) 500 MG tablet TAKE ONE HALF TABLET BY MOUTH TWICE DAILY 11/25/13   Abner Greenspan, MD  predniSONE (DELTASONE) 10 MG tablet Take 3 pills once daily by mouth for 3 days, then 2 pills once daily for 3 days, then 1 pill once daily for 3 days and then stop 03/14/14   Abner Greenspan, MD  simvastatin (ZOCOR) 20 MG tablet 1/2 tablet by mouth every evening with a lowfat snack 08/01/13   Abner Greenspan, MD   BP 125/57 mmHg  Pulse 97  Temp(Src) 97.9 F (36.6 C) (Oral)  Resp 20  Ht 5' (1.524 m)  Wt 189 lb (85.73 kg)  BMI 36.91 kg/m2  SpO2  97%   Physical Exam  Constitutional: She is oriented to person, place, and time. She appears well-developed and well-nourished. No distress.  HENT:  Head: Normocephalic and atraumatic.  Mouth/Throat: Oropharynx is clear and moist.  Eyes: Conjunctivae and EOM are normal. Pupils are equal, round, and reactive to light.  Neck: Normal range of motion. Neck supple.  Cardiovascular: Normal rate, regular rhythm and normal heart sounds.   Pulmonary/Chest: Effort normal and breath sounds normal. No respiratory distress. She has no wheezes.  Abdominal: Soft. Bowel sounds are decreased. There is tenderness in the right lower quadrant and left lower quadrant. There is no guarding.  Abdomen soft, not overly distended, noted tenderness in right and left lower quadrants; decreased  bowel sounds noted  Musculoskeletal: Normal range of motion.  Neurological: She is alert and oriented to person, place, and time.  Skin: Skin is warm and dry. She is not diaphoretic.  Psychiatric: She has a normal mood and affect.  Nursing note and vitals reviewed.   ED Course  Procedures (including critical care time) Labs Review Labs Reviewed  COMPREHENSIVE METABOLIC PANEL - Abnormal; Notable for the following:    Glucose, Bld 124 (*)    AST 49 (*)    GFR calc non Af Amer 51 (*)    GFR calc Af Amer 59 (*)    All other components within normal limits  CBC WITH DIFFERENTIAL/PLATELET - Abnormal; Notable for the following:    Neutrophils Relative % 83 (*)    All other components within normal limits  URINALYSIS, ROUTINE W REFLEX MICROSCOPIC - Abnormal; Notable for the following:    APPearance CLOUDY (*)    Specific Gravity, Urine >1.030 (*)    Bilirubin Urine SMALL (*)    Ketones, ur 15 (*)    Protein, ur 30 (*)    Nitrite POSITIVE (*)    Leukocytes, UA SMALL (*)    All other components within normal limits  I-STAT CG4 LACTIC ACID, ED - Abnormal; Notable for the following:    Lactic Acid, Venous 2.50 (*)    All other components within normal limits  URINE CULTURE  LIPASE, BLOOD  URINE MICROSCOPIC-ADD ON    Imaging Review Ct Abdomen Pelvis W Contrast  04/26/2014   CLINICAL DATA:  Mrs. Navarrete is a very pleasant 79 yo female with a past medical history of Type 2 DM. She reports with generalized lower abdominal pain; described as "sharp" at times. Started 2 days ago and has worsened to a "9" in the last few hours. Pain that wakes her in the night. "Cramping" at times. Worsening nausea today. No emesis. No dysuria or hematuria. Normal Bowel movements with hematochezia or melana. Previous colonoscopy years ago was normal per patient. No past history of abdominal issues.  EXAM: CT ABDOMEN AND PELVIS WITH CONTRAST  TECHNIQUE: Multidetector CT imaging of the abdomen and pelvis was  performed using the standard protocol following bolus administration of intravenous contrast.  CONTRAST:  63mL OMNIPAQUE IOHEXOL 300 MG/ML SOLN, 158mL OMNIPAQUE IOHEXOL 300 MG/ML SOLN  COMPARISON:  None.  FINDINGS: There are sigmoid colon diverticula but no evidence of diverticulitis. No bowel wall thickening or inflammatory changes. No bowel distention. Normal small bowel. Appendix not visualized.  Uterus is surgically absent.  No pelvic masses.  Unremarkable kidneys, ureters and bladder.  Mild fatty infiltration of the liver. No liver mass or focal lesion. Normal spleen.  Gallbladder is distended with multiple dependent gallstones. No gallbladder wall thickening or pericholecystic inflammation. No bile duct dilation.  Normal pancreas.  Normal adrenal glands.  No pathologically enlarged lymph nodes. No abnormal fluid collections.  There are prominent degenerative changes of the lumbar spine. Bones are demineralized. No osteoblastic or osteolytic lesions.  IMPRESSION: 1. No acute findings. 2. Sigmoid colon diverticula without evidence of diverticulitis. 3. Multiple gallstones.  No evidence of acute cholecystitis. 4. Status post hysterectomy and possibly appendectomy. 5. Hepatic steatosis.   Electronically Signed   By: Lajean Manes M.D.   On: 04/26/2014 20:25     EKG Interpretation   Date/Time:  Saturday April 26 2014 18:26:50 EST Ventricular Rate:  98 PR Interval:  142 QRS Duration: 124 QT Interval:  375 QTC Calculation: 479 R Axis:   -80 Text Interpretation:  Sinus rhythm RBBB and LAFB RBBB new Confirmed by  Mingo Amber  MD, BLAIR ([id number]) on 04/26/2014 6:32:01 PM      MDM   Final diagnoses:  Lower abdominal pain  UTI (lower urinary tract infection)   79 year old female presenting from urgent care for further evaluation of lower abdominal pain. She was febrile on arrival to urgent care, afebrile on arrival to ED. On exam, patient is nontoxic in appearance. She does have noted tenderness of her  right and left lower quadrants without peritonitis. Labs were obtained-- mildly elevated lactic acid at 2.5, normal blood cell count.  UA nitrite positive, culture pending. Patient given dose of IV Rocephin. CT abdomen pelvis was obtained which is negative for acute findings.  After small dose of pain medication all his medicine in the ED, patient states she is feeling better. She is currently tolerating PO without difficulty, VS remain stable.  Patient will be d/c home with keflex pending urine culture.  FU with PCP.  Discussed plan with patient, he/she acknowledged understanding and agreed with plan of care.  Return precautions given for new or worsening symptoms.  Case discussed with attending physician, Dr. Mingo Amber, who evaluated patient and agrees with assessment and plan of care.  Larene Pickett, PA-C 04/26/14 2155  Larene Pickett, PA-C 04/26/14 2156  Evelina Bucy, MD 04/26/14 [phone]

## 2014-04-26 NOTE — ED Notes (Signed)
Pt  Advised  NPO

## 2014-04-26 NOTE — Discharge Instructions (Signed)
Take the prescribed medication as directed. °Follow-up with your primary care physician. °Return to the ED for new or worsening symptoms. ° °

## 2014-04-26 NOTE — ED Provider Notes (Signed)
CSN: [id number]     Arrival date & time 04/26/14  1545 History   First MD Initiated Contact with Patient 04/26/14 1651     Chief Complaint  Patient presents with  . Abdominal Pain   (Consider location/radiation/quality/duration/timing/severity/associated sxs/prior Treatment) HPI Comments: Cindy Whitehead is a very pleasant 79 yo female with a past medical history of Type 2 DM. She reports with generalized lower abdominal pain; described as "sharp" at times. Started 2 days ago and has worsened to a "9" in the last few hours. Pain that wakes her in the night. "Cramping" at times. Worsening nausea today. No emesis. No dysuria or hematuria. Normal Bowel movements with hematochezia or melana. Previous colonoscopy years ago was normal per patient. No past history of abdominal issues.   Patient is a 79 y.o. female presenting with abdominal pain. The history is provided by the patient.  Abdominal Pain Associated symptoms: fatigue, fever and nausea   Associated symptoms: no diarrhea and no vomiting     Past Medical History  Diagnosis Date  . Allergic rhinitis   . Asthma   . Diabetes mellitus   . Hyperlipidemia   . Obesity   . Pes planus   . Bone loss     ? osteopenia   . Stress     cares for mohter who is verbally abusive   . Kidney stones    Past Surgical History  Procedure Laterality Date  . Knee surgery     Family History  Problem Relation Age of Onset  . Colon cancer Mother    History  Substance Use Topics  . Smoking status: Never Smoker   . Smokeless tobacco: Not on file  . Alcohol Use: No   OB History    No data available     Review of Systems  Constitutional: Positive for fever and fatigue.  Respiratory: Negative.   Gastrointestinal: Positive for nausea and abdominal pain. Negative for vomiting, diarrhea, blood in stool, anal bleeding and rectal pain.  Genitourinary: Negative.  Negative for pelvic pain.  Skin: Negative.   Psychiatric/Behavioral: Negative.      Allergies  Atorvastatin  Home Medications   Prior to Admission medications   Medication Sig Start Date End Date Taking? Authorizing Provider  albuterol (PROVENTIL HFA;VENTOLIN HFA) 108 (90 BASE) MCG/ACT inhaler Inhale 2 puffs into the lungs every 6 (six) hours as needed for wheezing or shortness of breath. 03/14/14   Abner Greenspan, MD  azithromycin (ZITHROMAX Z-PAK) 250 MG tablet Take 2 pills by mouth today and then 1 pill daily for 4 days 03/14/14   Abner Greenspan, MD  glucose blood test strip Check blood sugar 3 times daily. 01/23/13   Jearld Fenton, NP  lisinopril (PRINIVIL,ZESTRIL) 5 MG tablet TAKE ONE TABLET BY MOUTH EVERY MORNING 08/05/13   Abner Greenspan, MD  metFORMIN (GLUCOPHAGE) 500 MG tablet TAKE ONE HALF TABLET BY MOUTH TWICE DAILY 11/25/13   Abner Greenspan, MD  predniSONE (DELTASONE) 10 MG tablet Take 3 pills once daily by mouth for 3 days, then 2 pills once daily for 3 days, then 1 pill once daily for 3 days and then stop 03/14/14   Abner Greenspan, MD  simvastatin (ZOCOR) 20 MG tablet 1/2 tablet by mouth every evening with a lowfat snack 08/01/13   Abner Greenspan, MD   BP 142/46 mmHg  Pulse 95  Temp(Src) 101.4 F (38.6 C) (Oral)  Resp 20  SpO2 98% Physical Exam  Constitutional: She is oriented to  person, place, and time. She appears well-developed and well-nourished.  Appears comfortable until walking when bends over in pain, obese  Cardiovascular: Normal rate and regular rhythm.   Pulmonary/Chest: Effort normal.  Abdominal: Soft. She exhibits no distension. There is tenderness. There is guarding. There is no rebound.  Decreased BS in the lower quadrants, Tenderness to palpation in the bilateral lower quadrant, guarding, noted grimacing, no rebound noted.   Neurological: She is alert and oriented to person, place, and time.  Skin: Skin is warm and dry. No rash noted.  Psychiatric: Her behavior is normal.  Nursing note and vitals reviewed.   ED Course  Procedures (including  critical care time) Labs Review Labs Reviewed - No data to display  Imaging Review No results found.   MDM   1. Generalized abdominal pain   2. Other specified fever    Elderly patient with a 2 day onset of worsening abdominal pain, exam concerning for infectious process/diverticulitis; febrile. Send to ER for evaluation by CT.     Bjorn Pippin, PA-C 04/26/14 [phone]

## 2014-04-26 NOTE — ED Notes (Signed)
Pt here from urgent care for lower abd pain and temp 101.4.    Onset Thursday night lower abd pain,getting worse, burning; nausea.  Vomited x 1 yesterday.  No urinary symptoms.

## 2014-04-26 NOTE — ED Notes (Signed)
Abnormal lactic acid given to Quincy Carnes PA

## 2014-04-28 LAB — URINE CULTURE: Colony Count: 10000

## 2014-05-05 ENCOUNTER — Telehealth: Payer: Self-pay | Admitting: Family Medicine

## 2014-05-05 DIAGNOSIS — I10 Essential (primary) hypertension: Secondary | ICD-10-CM

## 2014-05-05 DIAGNOSIS — E785 Hyperlipidemia, unspecified: Secondary | ICD-10-CM

## 2014-05-05 DIAGNOSIS — E559 Vitamin D deficiency, unspecified: Secondary | ICD-10-CM

## 2014-05-05 DIAGNOSIS — M8580 Other specified disorders of bone density and structure, unspecified site: Secondary | ICD-10-CM

## 2014-05-05 DIAGNOSIS — E119 Type 2 diabetes mellitus without complications: Secondary | ICD-10-CM

## 2014-05-05 NOTE — Telephone Encounter (Signed)
-----   Message from Ellamae Sia sent at 04/30/2014  9:23 AM EST ----- Regarding: Lab orders for Tuesday, 3.1.16 Patient is scheduled for CPX labs, please order future labs, Thanks , Karna Christmas

## 2014-05-06 ENCOUNTER — Other Ambulatory Visit (INDEPENDENT_AMBULATORY_CARE_PROVIDER_SITE_OTHER): Payer: Medicare Other

## 2014-05-06 DIAGNOSIS — E119 Type 2 diabetes mellitus without complications: Secondary | ICD-10-CM | POA: Diagnosis not present

## 2014-05-06 DIAGNOSIS — E785 Hyperlipidemia, unspecified: Secondary | ICD-10-CM | POA: Diagnosis not present

## 2014-05-06 DIAGNOSIS — M8580 Other specified disorders of bone density and structure, unspecified site: Secondary | ICD-10-CM | POA: Diagnosis not present

## 2014-05-06 DIAGNOSIS — I10 Essential (primary) hypertension: Secondary | ICD-10-CM

## 2014-05-06 DIAGNOSIS — E559 Vitamin D deficiency, unspecified: Secondary | ICD-10-CM

## 2014-05-06 LAB — CBC WITH DIFFERENTIAL/PLATELET
BASOS ABS: 0 10*3/uL (ref 0.0–0.1)
Basophils Relative: 0.5 % (ref 0.0–3.0)
Eosinophils Absolute: 0.2 10*3/uL (ref 0.0–0.7)
Eosinophils Relative: 3 % (ref 0.0–5.0)
HCT: 41.5 % (ref 36.0–46.0)
HEMOGLOBIN: 14.3 g/dL (ref 12.0–15.0)
LYMPHS PCT: 28.4 % (ref 12.0–46.0)
Lymphs Abs: 1.7 10*3/uL (ref 0.7–4.0)
MCHC: 34.3 g/dL (ref 30.0–36.0)
MCV: 92.4 fl (ref 78.0–100.0)
MONOS PCT: 6 % (ref 3.0–12.0)
Monocytes Absolute: 0.4 10*3/uL (ref 0.1–1.0)
NEUTROS ABS: 3.7 10*3/uL (ref 1.4–7.7)
NEUTROS PCT: 62.1 % (ref 43.0–77.0)
PLATELETS: 276 10*3/uL (ref 150.0–400.0)
RBC: 4.49 Mil/uL (ref 3.87–5.11)
RDW: 12.9 % (ref 11.5–15.5)
WBC: 6 10*3/uL (ref 4.0–10.5)

## 2014-05-06 LAB — LIPID PANEL
Cholesterol: 121 mg/dL (ref 0–200)
HDL: 39.8 mg/dL (ref >39.00)
LDL Cholesterol: 56 mg/dL (ref 0–99)
NONHDL: 81.2
Total CHOL/HDL Ratio: 3
Triglycerides: 124 mg/dL (ref 0.0–149.0)
VLDL: 24.8 mg/dL (ref 0.0–40.0)

## 2014-05-06 LAB — COMPREHENSIVE METABOLIC PANEL
ALT: 23 U/L (ref 0–35)
AST: 29 U/L (ref 0–37)
Albumin: 4 g/dL (ref 3.5–5.2)
Alkaline Phosphatase: 65 U/L (ref 39–117)
BILIRUBIN TOTAL: 0.9 mg/dL (ref 0.2–1.2)
BUN: 16 mg/dL (ref 6–23)
CO2: 28 mEq/L (ref 19–32)
Calcium: 9.4 mg/dL (ref 8.4–10.5)
Chloride: 105 mEq/L (ref 96–112)
Creatinine, Ser: 0.93 mg/dL (ref 0.40–1.20)
GFR: 62.21 mL/min (ref >60.00)
Glucose, Bld: 122 mg/dL — ABNORMAL HIGH (ref 70–99)
POTASSIUM: 4.5 meq/L (ref 3.5–5.1)
Sodium: 140 mEq/L (ref 135–145)
Total Protein: 7.2 g/dL (ref 6.0–8.3)

## 2014-05-06 LAB — TSH: TSH: 3.93 u[IU]/mL (ref 0.35–4.50)

## 2014-05-06 LAB — HEMOGLOBIN A1C: Hgb A1c MFr Bld: 6.4 % (ref 4.6–6.5)

## 2014-05-06 LAB — VITAMIN D 25 HYDROXY (VIT D DEFICIENCY, FRACTURES): VITD: 23.48 ng/mL — ABNORMAL LOW (ref 30.00–100.00)

## 2014-05-13 ENCOUNTER — Ambulatory Visit (INDEPENDENT_AMBULATORY_CARE_PROVIDER_SITE_OTHER): Payer: Medicare Other | Admitting: Family Medicine

## 2014-05-13 ENCOUNTER — Encounter: Payer: Self-pay | Admitting: Family Medicine

## 2014-05-13 VITALS — BP 120/72 | HR 79 | Temp 97.7°F | Ht <= 58 in | Wt 192.1 lb

## 2014-05-13 DIAGNOSIS — E119 Type 2 diabetes mellitus without complications: Secondary | ICD-10-CM | POA: Diagnosis not present

## 2014-05-13 DIAGNOSIS — M8580 Other specified disorders of bone density and structure, unspecified site: Secondary | ICD-10-CM | POA: Diagnosis not present

## 2014-05-13 DIAGNOSIS — E669 Obesity, unspecified: Secondary | ICD-10-CM

## 2014-05-13 DIAGNOSIS — E559 Vitamin D deficiency, unspecified: Secondary | ICD-10-CM | POA: Diagnosis not present

## 2014-05-13 DIAGNOSIS — Z0000 Encounter for general adult medical examination without abnormal findings: Secondary | ICD-10-CM

## 2014-05-13 DIAGNOSIS — Z23 Encounter for immunization: Secondary | ICD-10-CM | POA: Diagnosis not present

## 2014-05-13 DIAGNOSIS — E785 Hyperlipidemia, unspecified: Secondary | ICD-10-CM

## 2014-05-13 DIAGNOSIS — I10 Essential (primary) hypertension: Secondary | ICD-10-CM

## 2014-05-13 MED ORDER — ERGOCALCIFEROL 1.25 MG (50000 UT) PO CAPS: 50000.0000 [IU] | ORAL_CAPSULE | ORAL | Status: DC

## 2014-05-13 MED ORDER — PNEUMOCOCCAL 13-VAL CONJ VACC IM SUSP
0.5000 mL | Freq: Once | INTRAMUSCULAR | Status: AC
  Administered 2014-05-13: 0.5 mL via INTRAMUSCULAR

## 2014-05-13 NOTE — Assessment & Plan Note (Addendum)
bp in fair control at this time  BP Readings from Last 1 Encounters:  05/13/14 120/72   No changes needed Disc lifstyle change with low sodium diet and exercise  She did not tolerate ace (caused cough)   Labs reviewed

## 2014-05-13 NOTE — Assessment & Plan Note (Signed)
dexa was 09 She will see gyn (Dr Nori Riis) and schedule that  D level is low - plan to tx with high dose weekly ergocalciferol for 12 weeks and continue her regular dosing Disc imp to overall health and bone health

## 2014-05-13 NOTE — Assessment & Plan Note (Signed)
Reviewed health habits including diet and exercise and skin cancer prevention Reviewed appropriate screening tests for age  Also reviewed health mt list, fam hx and immunization status , as well as social and family history   See HPI Labs reviewed  She will get mammogram and dexa at Dr Verlon Au office  prevnar vaccine today Materials given to work on [employer] and disc imp of this  Adv to get Tdap at the health dept for affordability

## 2014-05-13 NOTE — Patient Instructions (Signed)
Follow up with gyn for your mammogram and bone density test  prevnar vaccine today  Work on and advance directive (living will)- I gave you materials  Take the weekly vitamin D for 12 weeks Continue the vitamin D you are taking on top of that  Go by [place] and ask them about their diabetic education program  You are due for a tetanus shot - since medicare does not cover this-get it at the health dept   Take care of yourself Follow up in 6 months with labs prior

## 2014-05-13 NOTE — Assessment & Plan Note (Signed)
Despite 4800 iu daily still too low at 23 Will try 12 weeks of high dose weekly tx on top of that -then continue current  Disc imp to bone and overall health

## 2014-05-13 NOTE — Addendum Note (Signed)
Addended by: Amado Coe on: 05/13/2014 10:43 AM   Modules accepted: Orders
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
## 2014-05-13 NOTE — Assessment & Plan Note (Signed)
Discussed how this problem influences overall health and the risks it imposes  Reviewed plan for weight loss with lower calorie diet (via better food choices and also portion control or program like weight watchers) and exercise building up to or more than 30 minutes 5 days per week including some aerobic activity    

## 2014-05-13 NOTE — Assessment & Plan Note (Signed)
Lab Results  Component Value Date   HGBA1C 6.4 05/06/2014   Overall stable Pt has not done DM teaching in the past and now has the time to do it  Enc her to go to [facility] and ask about their program-will update F/u 6 mo with lab prior

## 2014-05-13 NOTE — Progress Notes (Signed)
Pre visit review using our clinic review tool, if applicable. No additional management support is needed unless otherwise documented below in the visit note. 

## 2014-06-03 ENCOUNTER — Encounter: Payer: Self-pay | Admitting: Internal Medicine

## 2014-06-03 ENCOUNTER — Ambulatory Visit (INDEPENDENT_AMBULATORY_CARE_PROVIDER_SITE_OTHER): Payer: Medicare Other | Admitting: Internal Medicine

## 2014-06-03 VITALS — BP 130/78 | HR 72 | Temp 98.2°F | Wt 195.5 lb

## 2014-06-03 DIAGNOSIS — M778 Other enthesopathies, not elsewhere classified: Secondary | ICD-10-CM | POA: Insufficient documentation

## 2014-06-03 DIAGNOSIS — M779 Enthesopathy, unspecified: Secondary | ICD-10-CM

## 2014-06-03 NOTE — Patient Instructions (Signed)
Please try intermittent ice on your thumb. If the pain persists, it would be reasonable to set up an appointment with a hand specialist
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
## 2014-06-03 NOTE — Progress Notes (Signed)
Pre visit review using our clinic review tool, if applicable. No additional management support is needed unless otherwise documented below in the visit note. 

## 2014-06-03 NOTE — Assessment & Plan Note (Signed)
Nothing to suggest arthritis as cause or neuropathy Reassured not serious Can continue the ibuprofen for short time Advised ice intermittently Discussed that this can take some time to resolve

## 2014-06-09 ENCOUNTER — Other Ambulatory Visit: Payer: Self-pay | Admitting: Family Medicine

## 2014-06-18 DIAGNOSIS — E119 Type 2 diabetes mellitus without complications: Secondary | ICD-10-CM | POA: Diagnosis not present

## 2014-06-18 DIAGNOSIS — H40013 Open angle with borderline findings, low risk, bilateral: Secondary | ICD-10-CM | POA: Diagnosis not present

## 2014-06-18 LAB — HM DIABETES EYE EXAM

## 2014-07-14 ENCOUNTER — Encounter: Payer: Self-pay | Admitting: Family Medicine

## 2014-07-18 DIAGNOSIS — B079 Viral wart, unspecified: Secondary | ICD-10-CM | POA: Diagnosis not present

## 2014-07-18 DIAGNOSIS — L570 Actinic keratosis: Secondary | ICD-10-CM | POA: Diagnosis not present

## 2014-09-02 ENCOUNTER — Ambulatory Visit (INDEPENDENT_AMBULATORY_CARE_PROVIDER_SITE_OTHER)
Admission: RE | Admit: 2014-09-02 | Discharge: 2014-09-02 | Disposition: A | Discharge status: Home / Self Care | Payer: Medicare Other | Source: Ambulatory Visit | Attending: Family Medicine | Admitting: Family Medicine

## 2014-09-02 ENCOUNTER — Encounter: Payer: Self-pay | Admitting: Family Medicine

## 2014-09-02 ENCOUNTER — Ambulatory Visit (INDEPENDENT_AMBULATORY_CARE_PROVIDER_SITE_OTHER): Payer: Medicare Other | Admitting: Family Medicine

## 2014-09-02 VITALS — BP 124/70 | HR 72 | Temp 98.0°F | Ht <= 58 in | Wt 192.8 lb

## 2014-09-02 DIAGNOSIS — M79604 Pain in right leg: Secondary | ICD-10-CM | POA: Insufficient documentation

## 2014-09-02 DIAGNOSIS — M545 Low back pain, unspecified: Secondary | ICD-10-CM

## 2014-09-02 DIAGNOSIS — M5136 Other intervertebral disc degeneration, lumbar region: Secondary | ICD-10-CM | POA: Diagnosis not present

## 2014-09-02 MED ORDER — PREDNISONE 10 MG PO TABS: ORAL_TABLET | ORAL | Status: DC

## 2014-09-02 NOTE — Progress Notes (Signed)
Pre visit review using our clinic review tool, if applicable. No additional management support is needed unless otherwise documented below in the visit note. 
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
## 2014-09-02 NOTE — Assessment & Plan Note (Signed)
With known hx of deg disc and spine dz and spinal stenosis Rev MRI from 2003  Xray today  Prednisone taper 30 mg -disc poss side eff  Heat/ice prn  Will update with result and plan based on response

## 2014-09-02 NOTE — Patient Instructions (Signed)
Xray of back today  Take prednisone as directed  I think your degenerative back disease causes your symptoms  Heat on ice on back are ok back and forth  Walking helps also

## 2014-09-03 ENCOUNTER — Telehealth: Payer: Self-pay | Admitting: Family Medicine

## 2014-09-03 DIAGNOSIS — M545 Low back pain, unspecified: Secondary | ICD-10-CM

## 2014-09-03 NOTE — Telephone Encounter (Signed)
Ref done  

## 2014-09-03 NOTE — Telephone Encounter (Signed)
-----   Message from Tammi Sou, [place] sent at 09/03/2014  9:33 AM EDT ----- Pt notified of xray results and Dr. Marliss Coots recommendations. Pt agrees with referral to ortho, I advise our [facility] will call to schedule appt

## 2014-09-04 NOTE — Telephone Encounter (Signed)
[facility] 09/05/14 @ 10am Dr. Layne Benton  pt aware of appt day time and location -arc

## 2014-09-05 DIAGNOSIS — M47816 Spondylosis without myelopathy or radiculopathy, lumbar region: Secondary | ICD-10-CM | POA: Diagnosis not present

## 2014-09-09 ENCOUNTER — Other Ambulatory Visit: Payer: Self-pay | Admitting: Family Medicine

## 2014-09-16 DIAGNOSIS — M47896 Other spondylosis, lumbar region: Secondary | ICD-10-CM | POA: Diagnosis not present

## 2014-09-16 DIAGNOSIS — M5136 Other intervertebral disc degeneration, lumbar region: Secondary | ICD-10-CM | POA: Diagnosis not present

## 2014-09-16 DIAGNOSIS — M4806 Spinal stenosis, lumbar region: Secondary | ICD-10-CM | POA: Diagnosis not present

## 2014-09-16 DIAGNOSIS — M545 Low back pain: Secondary | ICD-10-CM | POA: Diagnosis not present

## 2014-09-18 DIAGNOSIS — M47896 Other spondylosis, lumbar region: Secondary | ICD-10-CM | POA: Diagnosis not present

## 2014-09-18 DIAGNOSIS — M4806 Spinal stenosis, lumbar region: Secondary | ICD-10-CM | POA: Diagnosis not present

## 2014-09-18 DIAGNOSIS — M5136 Other intervertebral disc degeneration, lumbar region: Secondary | ICD-10-CM | POA: Diagnosis not present

## 2014-09-18 DIAGNOSIS — M545 Low back pain: Secondary | ICD-10-CM | POA: Diagnosis not present

## 2014-09-25 DIAGNOSIS — M47896 Other spondylosis, lumbar region: Secondary | ICD-10-CM | POA: Diagnosis not present

## 2014-09-25 DIAGNOSIS — M4806 Spinal stenosis, lumbar region: Secondary | ICD-10-CM | POA: Diagnosis not present

## 2014-09-25 DIAGNOSIS — M545 Low back pain: Secondary | ICD-10-CM | POA: Diagnosis not present

## 2014-09-25 DIAGNOSIS — M5136 Other intervertebral disc degeneration, lumbar region: Secondary | ICD-10-CM | POA: Diagnosis not present

## 2014-09-30 DIAGNOSIS — M5136 Other intervertebral disc degeneration, lumbar region: Secondary | ICD-10-CM | POA: Diagnosis not present

## 2014-09-30 DIAGNOSIS — M545 Low back pain: Secondary | ICD-10-CM | POA: Diagnosis not present

## 2014-09-30 DIAGNOSIS — M47896 Other spondylosis, lumbar region: Secondary | ICD-10-CM | POA: Diagnosis not present

## 2014-09-30 DIAGNOSIS — M4806 Spinal stenosis, lumbar region: Secondary | ICD-10-CM | POA: Diagnosis not present

## 2014-10-01 DIAGNOSIS — M4806 Spinal stenosis, lumbar region: Secondary | ICD-10-CM | POA: Diagnosis not present

## 2014-10-01 DIAGNOSIS — M47896 Other spondylosis, lumbar region: Secondary | ICD-10-CM | POA: Diagnosis not present

## 2014-10-07 DIAGNOSIS — M545 Low back pain: Secondary | ICD-10-CM | POA: Diagnosis not present

## 2014-10-09 DIAGNOSIS — M6250 Muscle wasting and atrophy, not elsewhere classified, unspecified site: Secondary | ICD-10-CM | POA: Diagnosis not present

## 2014-10-23 DIAGNOSIS — M545 Low back pain: Secondary | ICD-10-CM | POA: Diagnosis not present

## 2014-10-23 DIAGNOSIS — M47817 Spondylosis without myelopathy or radiculopathy, lumbosacral region: Secondary | ICD-10-CM | POA: Diagnosis not present

## 2014-11-05 DIAGNOSIS — M47817 Spondylosis without myelopathy or radiculopathy, lumbosacral region: Secondary | ICD-10-CM | POA: Diagnosis not present

## 2014-11-05 DIAGNOSIS — M4806 Spinal stenosis, lumbar region: Secondary | ICD-10-CM | POA: Diagnosis not present

## 2014-11-05 DIAGNOSIS — M545 Low back pain: Secondary | ICD-10-CM | POA: Diagnosis not present

## 2014-11-11 ENCOUNTER — Other Ambulatory Visit (INDEPENDENT_AMBULATORY_CARE_PROVIDER_SITE_OTHER): Payer: Medicare Other

## 2014-11-11 DIAGNOSIS — E785 Hyperlipidemia, unspecified: Secondary | ICD-10-CM

## 2014-11-11 DIAGNOSIS — I10 Essential (primary) hypertension: Secondary | ICD-10-CM | POA: Diagnosis not present

## 2014-11-11 DIAGNOSIS — E119 Type 2 diabetes mellitus without complications: Secondary | ICD-10-CM

## 2014-11-11 LAB — COMPREHENSIVE METABOLIC PANEL
ALT: 24 U/L (ref 0–35)
AST: 30 U/L (ref 0–37)
Albumin: 4 g/dL (ref 3.5–5.2)
Alkaline Phosphatase: 65 U/L (ref 39–117)
BUN: 16 mg/dL (ref 6–23)
CHLORIDE: 104 meq/L (ref 96–112)
CO2: 29 mEq/L (ref 19–32)
CREATININE: 0.88 mg/dL (ref 0.40–1.20)
Calcium: 9.6 mg/dL (ref 8.4–10.5)
GFR: 66.22 mL/min (ref >60.00)
Glucose, Bld: 126 mg/dL — ABNORMAL HIGH (ref 70–99)
Potassium: 4.5 mEq/L (ref 3.5–5.1)
SODIUM: 143 meq/L (ref 135–145)
Total Bilirubin: 1 mg/dL (ref 0.2–1.2)
Total Protein: 6.7 g/dL (ref 6.0–8.3)

## 2014-11-11 LAB — LIPID PANEL
Cholesterol: 165 mg/dL (ref 0–200)
HDL: 46.9 mg/dL (ref >39.00)
NONHDL: 117.75
Total CHOL/HDL Ratio: 4
Triglycerides: 204 mg/dL — ABNORMAL HIGH (ref 0.0–149.0)
VLDL: 40.8 mg/dL — AB (ref 0.0–40.0)

## 2014-11-11 LAB — HEMOGLOBIN A1C: Hgb A1c MFr Bld: 6.2 % (ref 4.6–6.5)

## 2014-11-11 LAB — LDL CHOLESTEROL, DIRECT: Direct LDL: 94 mg/dL

## 2014-11-14 ENCOUNTER — Ambulatory Visit: Payer: Medicare Other | Admitting: Family Medicine

## 2014-11-17 ENCOUNTER — Encounter: Payer: Self-pay | Admitting: Family Medicine

## 2014-11-17 ENCOUNTER — Ambulatory Visit (INDEPENDENT_AMBULATORY_CARE_PROVIDER_SITE_OTHER): Payer: Medicare Other | Admitting: Family Medicine

## 2014-11-17 VITALS — BP 122/70 | HR 85 | Temp 98.0°F | Ht <= 58 in | Wt 189.0 lb

## 2014-11-17 DIAGNOSIS — Z23 Encounter for immunization: Secondary | ICD-10-CM

## 2014-11-17 DIAGNOSIS — E785 Hyperlipidemia, unspecified: Secondary | ICD-10-CM

## 2014-11-17 DIAGNOSIS — E669 Obesity, unspecified: Secondary | ICD-10-CM

## 2014-11-17 DIAGNOSIS — I10 Essential (primary) hypertension: Secondary | ICD-10-CM | POA: Diagnosis not present

## 2014-11-17 DIAGNOSIS — E119 Type 2 diabetes mellitus without complications: Secondary | ICD-10-CM | POA: Diagnosis not present

## 2014-11-17 NOTE — Assessment & Plan Note (Signed)
Better on 2nd check bp in fair control at this time  BP Readings from Last 1 Encounters:  11/17/14 122/70   No changes needed Disc lifstyle change with low sodium diet and exercise  Enc to continue wt loss

## 2014-11-17 NOTE — Assessment & Plan Note (Signed)
Discussed how this problem influences overall health and the risks it imposes  Reviewed plan for weight loss with lower calorie diet (via better food choices and also portion control or program like weight watchers) and exercise building up to or more than 30 minutes 5 days per week including some aerobic activity   Commended on effort so far

## 2014-11-17 NOTE — Progress Notes (Signed)
Pre visit review using our clinic review tool, if applicable. No additional management support is needed unless otherwise documented below in the visit note. 

## 2014-11-17 NOTE — Assessment & Plan Note (Signed)
Lab Results  Component Value Date   HGBA1C 6.2 11/11/2014   This is improved Metformin/diet  Enc to continue wt loss  Disc goals for diet and exercise F/u 26mo

## 2014-11-17 NOTE — Assessment & Plan Note (Signed)
Disc goals for lipids and reasons to control them Rev labs with pt Rev low sat fat diet in detail LDL and HDL are both improved Continue zocor and diet

## 2014-11-17 NOTE — Patient Instructions (Signed)
Keep working on weight loss Drink water Limit carbohydrates and eliminate sweets except for special occasions  Limit portions Try to walk daily   Follow up in 6 months for annual exam with labs prior

## 2014-11-17 NOTE — Progress Notes (Signed)
Subjective:    Patient ID: Cindy Whitehead, female    DOB: December 15, 1935, 79 y.o.   MRN: [id number]  HPI Here for f/u of chronic health problems   Wt is down 3 lb with bmi of 39 Trying to loose weight  Walking  Cutting down on carbohydrates   bp is stable today  No cp or palpitations or headaches or edema  No side effects to medicines  BP Readings from Last 3 Encounters:  11/17/14 134/88  09/02/14 124/70  06/03/14 130/78     Diabetes Home sugar results -no hypoglycemia  DM diet - better/really working on it  Exercise - walking  Symptoms -none  A1C last  Lab Results  Component Value Date   HGBA1C 6.2 11/11/2014  down to 6.2 Getting better and better   No problems with medications -metformin  Renal protection Last eye exam 4/16  (has a routine visit next week as well )    Cholesterol Lab Results  Component Value Date   CHOL 165 11/11/2014   CHOL 121 05/06/2014   CHOL 138 04/29/2013   Lab Results  Component Value Date   HDL 46.90 11/11/2014   HDL 39.80 05/06/2014   HDL 40.80 04/29/2013   Lab Results  Component Value Date   LDLCALC 56 05/06/2014   LDLCALC 71 04/29/2013   [place] 80 04/11/2012   Lab Results  Component Value Date   TRIG 204.0* 11/11/2014   TRIG 124.0 05/06/2014   TRIG 132.0 04/29/2013   Lab Results  Component Value Date   CHOLHDL 4 11/11/2014   CHOLHDL 3 05/06/2014   CHOLHDL 3 04/29/2013   Lab Results  Component Value Date   LDLDIRECT 94.0 11/11/2014   LDLDIRECT 125.8 09/23/2008   LDLDIRECT 130.3 05/21/2008   zocor and diet  HDL and LDL are improved with better diet   Had her flu shot as well   Having back problems  Had a shot - did not help at all   Patient Active Problem List   Diagnosis Date Noted  . Low back pain radiating to right leg 09/02/2014  . Tendonitis of finger 06/03/2014  . Encounter for Medicare annual wellness exam 05/13/2014  . Acute bronchitis with bronchospasm 03/14/2014  . Adjustment disorder with mixed  anxiety and depressed mood 10/29/2013  . Acute bronchitis 05/01/2013  . GERD (gastroesophageal reflux disease) 10/13/2010  . Vitamin D deficiency 04/14/2010  . Osteopenia 05/26/2008  . ESSENTIAL HYPERTENSION, BENIGN 05/21/2008  . Obesity 08/22/2007  . REACTION, ACUTE STRESS W/EMOTIONAL DSTURB 09/14/2006  . Diabetes type 2, controlled 05/11/2006  . Hyperlipidemia 05/11/2006  . ALLERGIC RHINITIS 05/11/2006  . ASTHMA 05/11/2006   Past Medical History  Diagnosis Date  . Allergic rhinitis   . Asthma   . Diabetes mellitus   . Hyperlipidemia   . Obesity   . Pes planus   . Bone loss     ? osteopenia   . Stress     cares for mohter who is verbally abusive   . Kidney stones    Past Surgical History  Procedure Laterality Date  . Knee surgery     Social History  Substance Use Topics  . Smoking status: Never Smoker   . Smokeless tobacco: None  . Alcohol Use: No   Family History  Problem Relation Age of Onset  . Colon cancer Mother    Allergies  Allergen Reactions  . Atorvastatin Other (See Comments)    REACTION: leg pain  . Ace Inhibitors Other (See  Comments)    cough   Current Outpatient Prescriptions on File Prior to Visit  Medication Sig Dispense Refill  . albuterol (PROVENTIL HFA;VENTOLIN HFA) 108 (90 BASE) MCG/ACT inhaler Inhale 2 puffs into the lungs every 6 (six) hours as needed for wheezing or shortness of breath. 1 Inhaler 1  . glucose blood test strip Check blood sugar 3 times daily. 100 each 2  . metFORMIN (GLUCOPHAGE) 500 MG tablet TAKE ONE HALF TABLET BY MOUTH TWICE DAILY 90 tablet 3  . Multiple Vitamin (MULTIVITAMIN WITH MINERALS) TABS tablet Take 1 tablet by mouth daily.    . simvastatin (ZOCOR) 20 MG tablet 1/2 TABLET BY MOUTH EVERY EVENING WITH A LOWFAT SNACK 45 tablet 0   No current facility-administered medications on file prior to visit.    Review of Systems Review of Systems  Constitutional: Negative for fever, appetite change, fatigue and  unexpected weight change.  Eyes: Negative for pain and visual disturbance.  Respiratory: Negative for cough and shortness of breath.   Cardiovascular: Negative for cp or palpitations    Gastrointestinal: Negative for nausea, diarrhea and constipation.  Genitourinary: Negative for urgency and frequency.  Skin: Negative for pallor or rash   MSK pos for chronic back and foot pain  Neurological: Negative for weakness, light-headedness, numbness and headaches.  Hematological: Negative for adenopathy. Does not bruise/bleed easily.  Psychiatric/Behavioral: Negative for dysphoric mood. The patient is not nervous/anxious.         Objective:   Physical Exam  Constitutional: She appears well-developed and well-nourished. No distress.  obese and well appearing   HENT:  Head: Normocephalic and atraumatic.  Mouth/Throat: Oropharynx is clear and moist.  Eyes: Conjunctivae and EOM are normal. Pupils are equal, round, and reactive to light.  Neck: Normal range of motion. Neck supple. No JVD present. Carotid bruit is not present. No thyromegaly present.  Cardiovascular: Normal rate, regular rhythm, normal heart sounds and intact distal pulses.  Exam reveals no gallop.   Pulmonary/Chest: Effort normal and breath sounds normal. No respiratory distress. She has no wheezes. She has no rales.  No crackles  Abdominal: Soft. Bowel sounds are normal. She exhibits no distension, no abdominal bruit and no mass. There is no tenderness.  Musculoskeletal: She exhibits no edema.  Limited rom LS  Lymphadenopathy:    She has no cervical adenopathy.  Neurological: She is alert. She has normal reflexes.  Skin: Skin is warm and dry. No rash noted.  Psychiatric: She has a normal mood and affect.  In good spirits           Assessment & Plan:   Problem List Items Addressed This Visit      Cardiovascular and Mediastinum   ESSENTIAL HYPERTENSION, BENIGN    Better on 2nd check bp in fair control at this time    BP Readings from Last 1 Encounters:  11/17/14 122/70   No changes needed Disc lifstyle change with low sodium diet and exercise  Enc to continue wt loss         Endocrine   Diabetes type 2, controlled    Lab Results  Component Value Date   HGBA1C 6.2 11/11/2014   This is improved Metformin/diet  Enc to continue wt loss  Disc goals for diet and exercise F/u 47mo         Other   Hyperlipidemia    Disc goals for lipids and reasons to control them Rev labs with pt Rev low sat fat diet in detail LDL and  HDL are both improved Continue zocor and diet       Obesity    Discussed how this problem influences overall health and the risks it imposes  Reviewed plan for weight loss with lower calorie diet (via better food choices and also portion control or program like weight watchers) and exercise building up to or more than 30 minutes 5 days per week including some aerobic activity   Commended on effort so far         Other Visit Diagnoses    Need for influenza vaccination    -  Primary    Relevant Orders    Flu Vaccine QUAD 36+ mos PF IM (Fluarix & Fluzone Quad PF) (Completed)

## 2014-11-25 DIAGNOSIS — M545 Low back pain: Secondary | ICD-10-CM | POA: Diagnosis not present

## 2014-11-25 DIAGNOSIS — M4806 Spinal stenosis, lumbar region: Secondary | ICD-10-CM | POA: Diagnosis not present

## 2014-11-25 DIAGNOSIS — M47817 Spondylosis without myelopathy or radiculopathy, lumbosacral region: Secondary | ICD-10-CM | POA: Diagnosis not present

## 2014-12-16 DIAGNOSIS — H40013 Open angle with borderline findings, low risk, bilateral: Secondary | ICD-10-CM | POA: Diagnosis not present

## 2014-12-16 DIAGNOSIS — E119 Type 2 diabetes mellitus without complications: Secondary | ICD-10-CM | POA: Diagnosis not present

## 2014-12-23 DIAGNOSIS — M47817 Spondylosis without myelopathy or radiculopathy, lumbosacral region: Secondary | ICD-10-CM | POA: Diagnosis not present

## 2014-12-23 DIAGNOSIS — M545 Low back pain: Secondary | ICD-10-CM | POA: Diagnosis not present

## 2014-12-23 DIAGNOSIS — M4806 Spinal stenosis, lumbar region: Secondary | ICD-10-CM | POA: Diagnosis not present

## 2015-01-09 DIAGNOSIS — M6250 Muscle wasting and atrophy, not elsewhere classified, unspecified site: Secondary | ICD-10-CM | POA: Diagnosis not present

## 2015-01-21 DIAGNOSIS — M6250 Muscle wasting and atrophy, not elsewhere classified, unspecified site: Secondary | ICD-10-CM | POA: Diagnosis not present

## 2015-01-22 DIAGNOSIS — M6250 Muscle wasting and atrophy, not elsewhere classified, unspecified site: Secondary | ICD-10-CM | POA: Diagnosis not present

## 2015-02-06 DIAGNOSIS — L821 Other seborrheic keratosis: Secondary | ICD-10-CM | POA: Diagnosis not present

## 2015-02-06 DIAGNOSIS — L812 Freckles: Secondary | ICD-10-CM | POA: Diagnosis not present

## 2015-02-06 DIAGNOSIS — D225 Melanocytic nevi of trunk: Secondary | ICD-10-CM | POA: Diagnosis not present

## 2015-02-06 DIAGNOSIS — D1801 Hemangioma of skin and subcutaneous tissue: Secondary | ICD-10-CM | POA: Diagnosis not present

## 2015-02-09 ENCOUNTER — Encounter: Payer: Self-pay | Admitting: Family Medicine

## 2015-02-09 ENCOUNTER — Ambulatory Visit (INDEPENDENT_AMBULATORY_CARE_PROVIDER_SITE_OTHER): Payer: Medicare Other | Admitting: Family Medicine

## 2015-02-09 ENCOUNTER — Ambulatory Visit (INDEPENDENT_AMBULATORY_CARE_PROVIDER_SITE_OTHER)
Admission: RE | Admit: 2015-02-09 | Discharge: 2015-02-09 | Disposition: A | Discharge status: Home / Self Care | Payer: Medicare Other | Source: Ambulatory Visit | Attending: Family Medicine | Admitting: Family Medicine

## 2015-02-09 VITALS — BP 140/80 | HR 87 | Temp 98.4°F | Ht <= 58 in | Wt 189.2 lb

## 2015-02-09 DIAGNOSIS — J209 Acute bronchitis, unspecified: Secondary | ICD-10-CM | POA: Diagnosis not present

## 2015-02-09 DIAGNOSIS — R05 Cough: Secondary | ICD-10-CM | POA: Diagnosis not present

## 2015-02-09 DIAGNOSIS — R0602 Shortness of breath: Secondary | ICD-10-CM | POA: Diagnosis not present

## 2015-02-09 DIAGNOSIS — R509 Fever, unspecified: Secondary | ICD-10-CM | POA: Diagnosis not present

## 2015-02-09 MED ORDER — AZITHROMYCIN 250 MG PO TABS: ORAL_TABLET | ORAL | Status: DC

## 2015-02-09 MED ORDER — HYDROCODONE-HOMATROPINE 5-1.5 MG/5ML PO SYRP: 5.0000 mL | ORAL_SOLUTION | Freq: Three times a day (TID) | ORAL | Status: DC | PRN

## 2015-02-09 NOTE — Assessment & Plan Note (Signed)
Symptoms consistent with this - but also few faint dry crackles heard at R base  CXR today  Cover with zpak Hycodan for cough  Follow closely Disc symptomatic care - see instructions on AVS  Update if not starting to improve in a week or if worsening

## 2015-02-09 NOTE — Patient Instructions (Signed)
Chest xray today  We will call about result Drink lots of water , and get lots of rest   mucinex is ok for congestion Use the hycodan cough medicine with caution (sedation) - for cough

## 2015-02-09 NOTE — Progress Notes (Signed)
Subjective:    Patient ID: Cindy Whitehead, female    DOB: 02-04-1936, 79 y.o.   MRN: [id number]  HPI Here with uri symptoms   Started with cold symptoms Friday night Raw throat  achey  Chest is sore  Sniffling/ runny nose  Coughed up scant blood yesterday (from coughing so hard- but also had a nosebleed so hard to tell where it was from)  Yellow phlegm   At night she feel feverish-has not checked temp   No sinus pain    Was exp to a cold a thanksgiving    Using robitussin dm  and then mucinex-no help with cough   Patient Active Problem List   Diagnosis Date Noted  . Low back pain radiating to right leg 09/02/2014  . Tendonitis of finger 06/03/2014  . Encounter for Medicare annual wellness exam 05/13/2014  . Acute bronchitis with bronchospasm 03/14/2014  . Adjustment disorder with mixed anxiety and depressed mood 10/29/2013  . Acute bronchitis 05/01/2013  . GERD (gastroesophageal reflux disease) 10/13/2010  . Vitamin D deficiency 04/14/2010  . Osteopenia 05/26/2008  . ESSENTIAL HYPERTENSION, BENIGN 05/21/2008  . Obesity 08/22/2007  . REACTION, ACUTE STRESS W/EMOTIONAL DSTURB 09/14/2006  . Diabetes type 2, controlled ([place]) 05/11/2006  . Hyperlipidemia 05/11/2006  . ALLERGIC RHINITIS 05/11/2006  . ASTHMA 05/11/2006   Past Medical History  Diagnosis Date  . Allergic rhinitis   . Asthma   . Diabetes mellitus   . Hyperlipidemia   . Obesity   . Pes planus   . Bone loss     ? osteopenia   . Stress     cares for mohter who is verbally abusive   . Kidney stones    Past Surgical History  Procedure Laterality Date  . Knee surgery     Social History  Substance Use Topics  . Smoking status: Never Smoker   . Smokeless tobacco: None  . Alcohol Use: No   Family History  Problem Relation Age of Onset  . Colon cancer Mother    Allergies  Allergen Reactions  . Atorvastatin Other (See Comments)    REACTION: leg pain  . Ace Inhibitors Other (See Comments)   cough   Current Outpatient Prescriptions on File Prior to Visit  Medication Sig Dispense Refill  . albuterol (PROVENTIL HFA;VENTOLIN HFA) 108 (90 BASE) MCG/ACT inhaler Inhale 2 puffs into the lungs every 6 (six) hours as needed for wheezing or shortness of breath. 1 Inhaler 1  . glucose blood test strip Check blood sugar 3 times daily. 100 each 2  . HYDROcodone-acetaminophen (NORCO/VICODIN) 5-325 MG per tablet Take 1 tablet by mouth 2 (two) times daily.    . metFORMIN (GLUCOPHAGE) 500 MG tablet TAKE ONE HALF TABLET BY MOUTH TWICE DAILY 90 tablet 3  . Multiple Vitamin (MULTIVITAMIN WITH MINERALS) TABS tablet Take 1 tablet by mouth daily.    . simvastatin (ZOCOR) 20 MG tablet 1/2 TABLET BY MOUTH EVERY EVENING WITH A LOWFAT SNACK 45 tablet 0   No current facility-administered medications on file prior to visit.    Review of Systems Review of Systems  Constitutional: Negative for , appetite change,  and unexpected weight change. pos for malaise ENT pos for rhinorrhea and cong w/o sinus pain / pos for mild ST Eyes: Negative for pain and visual disturbance.  Respiratory: Negative for  shortness of breath.  pos for cough and occ wheeze  Cardiovascular: Negative for cp or palpitations    Gastrointestinal: Negative for nausea, diarrhea and  constipation.  Genitourinary: Negative for urgency and frequency.  Skin: Negative for pallor or rash   Neurological: Negative for weakness, light-headedness, numbness and headaches.  Hematological: Negative for adenopathy. Does not bruise/bleed easily.  Psychiatric/Behavioral: Negative for dysphoric mood. The patient is not nervous/anxious.         Objective:   Physical Exam  Constitutional: She appears well-developed and well-nourished. No distress.  obese and well appearing   HENT:  Head: Normocephalic and atraumatic.  Right Ear: External ear normal.  Left Ear: External ear normal.  Mouth/Throat: Oropharynx is clear and moist.  Nares are injected and  congested  No sinus tenderness Clear rhinorrhea and post nasal drip   Eyes: Conjunctivae and EOM are normal. Pupils are equal, round, and reactive to light. Right eye exhibits no discharge. Left eye exhibits no discharge.  Neck: Normal range of motion. Neck supple.  Cardiovascular: Normal rate and normal heart sounds.   Pulmonary/Chest: Effort normal and breath sounds normal. No respiratory distress. She has no wheezes. She has no rales. She exhibits no tenderness.  Harsh bs   Good air exch   Dry crackles vs faint rales heard at R lung base   Musculoskeletal: She exhibits no edema.  Lymphadenopathy:    She has no cervical adenopathy.  Neurological: She is alert.  Skin: Skin is warm and dry. No rash noted. No pallor.  Psychiatric: She has a normal mood and affect.          Assessment & Plan:   Problem List Items Addressed This Visit      Respiratory   Acute bronchitis with bronchospasm - Primary    Symptoms consistent with this - but also few faint dry crackles heard at R base  CXR today  Cover with zpak Hycodan for cough  Follow closely Disc symptomatic care - see instructions on AVS  Update if not starting to improve in a week or if worsening        Relevant Orders   DG Chest 2 View

## 2015-02-09 NOTE — Progress Notes (Signed)
Pre visit review using our clinic review tool, if applicable. No additional management support is needed unless otherwise documented below in the visit note. 

## 2015-02-21 DIAGNOSIS — M6250 Muscle wasting and atrophy, not elsewhere classified, unspecified site: Secondary | ICD-10-CM | POA: Diagnosis not present

## 2015-03-24 DIAGNOSIS — M6250 Muscle wasting and atrophy, not elsewhere classified, unspecified site: Secondary | ICD-10-CM | POA: Diagnosis not present

## 2015-04-24 ENCOUNTER — Other Ambulatory Visit: Payer: Self-pay | Admitting: Family Medicine

## 2015-04-24 DIAGNOSIS — M6250 Muscle wasting and atrophy, not elsewhere classified, unspecified site: Secondary | ICD-10-CM | POA: Diagnosis not present

## 2015-05-10 ENCOUNTER — Telehealth: Payer: Self-pay | Admitting: Family Medicine

## 2015-05-10 DIAGNOSIS — E119 Type 2 diabetes mellitus without complications: Secondary | ICD-10-CM

## 2015-05-10 DIAGNOSIS — E785 Hyperlipidemia, unspecified: Secondary | ICD-10-CM

## 2015-05-10 DIAGNOSIS — I10 Essential (primary) hypertension: Secondary | ICD-10-CM

## 2015-05-10 DIAGNOSIS — E559 Vitamin D deficiency, unspecified: Secondary | ICD-10-CM

## 2015-05-10 NOTE — Telephone Encounter (Signed)
-----   Message from Ellamae Sia sent at 05/04/2015 11:24 AM EST ----- Regarding: Lab orders for Wednesday, 3.8.17  AWV lab orders, please.

## 2015-05-13 ENCOUNTER — Other Ambulatory Visit (INDEPENDENT_AMBULATORY_CARE_PROVIDER_SITE_OTHER): Payer: Medicare Other

## 2015-05-13 DIAGNOSIS — E559 Vitamin D deficiency, unspecified: Secondary | ICD-10-CM

## 2015-05-13 DIAGNOSIS — E785 Hyperlipidemia, unspecified: Secondary | ICD-10-CM

## 2015-05-13 DIAGNOSIS — I10 Essential (primary) hypertension: Secondary | ICD-10-CM

## 2015-05-13 DIAGNOSIS — E119 Type 2 diabetes mellitus without complications: Secondary | ICD-10-CM | POA: Diagnosis not present

## 2015-05-13 LAB — COMPREHENSIVE METABOLIC PANEL
ALT: 23 U/L (ref 0–35)
AST: 30 U/L (ref 0–37)
Albumin: 4.1 g/dL (ref 3.5–5.2)
Alkaline Phosphatase: 66 U/L (ref 39–117)
BILIRUBIN TOTAL: 0.9 mg/dL (ref 0.2–1.2)
BUN: 20 mg/dL (ref 6–23)
CALCIUM: 9.7 mg/dL (ref 8.4–10.5)
CHLORIDE: 106 meq/L (ref 96–112)
CO2: 27 meq/L (ref 19–32)
Creatinine, Ser: 0.9 mg/dL (ref 0.40–1.20)
GFR: 64.44 mL/min (ref >60.00)
GLUCOSE: 127 mg/dL — AB (ref 70–99)
POTASSIUM: 4.7 meq/L (ref 3.5–5.1)
SODIUM: 141 meq/L (ref 135–145)
Total Protein: 7.1 g/dL (ref 6.0–8.3)

## 2015-05-13 LAB — CBC WITH DIFFERENTIAL/PLATELET
BASOS PCT: 0.9 % (ref 0.0–3.0)
Basophils Absolute: 0.1 10*3/uL (ref 0.0–0.1)
EOS ABS: 0.3 10*3/uL (ref 0.0–0.7)
EOS PCT: 4.3 % (ref 0.0–5.0)
HCT: 44 % (ref 36.0–46.0)
Hemoglobin: 15.3 g/dL — ABNORMAL HIGH (ref 12.0–15.0)
LYMPHS ABS: 2.3 10*3/uL (ref 0.7–4.0)
Lymphocytes Relative: 29.3 % (ref 12.0–46.0)
MCHC: 34.7 g/dL (ref 30.0–36.0)
MCV: 91.9 fl (ref 78.0–100.0)
MONO ABS: 0.4 10*3/uL (ref 0.1–1.0)
Monocytes Relative: 5.5 % (ref 3.0–12.0)
NEUTROS ABS: 4.7 10*3/uL (ref 1.4–7.7)
NEUTROS PCT: 60 % (ref 43.0–77.0)
PLATELETS: 213 10*3/uL (ref 150.0–400.0)
RBC: 4.79 Mil/uL (ref 3.87–5.11)
RDW: 12.9 % (ref 11.5–15.5)
WBC: 7.8 10*3/uL (ref 4.0–10.5)

## 2015-05-13 LAB — LIPID PANEL
CHOL/HDL RATIO: 3
CHOLESTEROL: 165 mg/dL (ref 0–200)
HDL: 47.7 mg/dL (ref >39.00)
LDL CALC: 84 mg/dL (ref 0–99)
NonHDL: 117.49
TRIGLYCERIDES: 167 mg/dL — AB (ref 0.0–149.0)
VLDL: 33.4 mg/dL (ref 0.0–40.0)

## 2015-05-13 LAB — HEMOGLOBIN A1C: Hgb A1c MFr Bld: 6.2 % (ref 4.6–6.5)

## 2015-05-13 LAB — VITAMIN D 25 HYDROXY (VIT D DEFICIENCY, FRACTURES): VITD: 20.11 ng/mL — AB (ref 30.00–100.00)

## 2015-05-13 LAB — MICROALBUMIN / CREATININE URINE RATIO
Creatinine,U: 249.4 mg/dL
MICROALB UR: 3.9 mg/dL — AB (ref 0.0–1.9)
Microalb Creat Ratio: 1.6 mg/g (ref 0.0–30.0)

## 2015-05-13 LAB — TSH: TSH: 3.01 u[IU]/mL (ref 0.35–4.50)

## 2015-05-20 ENCOUNTER — Ambulatory Visit (INDEPENDENT_AMBULATORY_CARE_PROVIDER_SITE_OTHER): Payer: Medicare Other | Admitting: Family Medicine

## 2015-05-20 ENCOUNTER — Telehealth: Payer: Self-pay

## 2015-05-20 ENCOUNTER — Encounter: Payer: Self-pay | Admitting: Family Medicine

## 2015-05-20 ENCOUNTER — Ambulatory Visit (INDEPENDENT_AMBULATORY_CARE_PROVIDER_SITE_OTHER): Payer: Medicare Other

## 2015-05-20 VITALS — BP 126/78 | HR 55 | Temp 97.8°F | Ht 59.0 in | Wt 186.5 lb

## 2015-05-20 DIAGNOSIS — M8580 Other specified disorders of bone density and structure, unspecified site: Secondary | ICD-10-CM

## 2015-05-20 DIAGNOSIS — Z0000 Encounter for general adult medical examination without abnormal findings: Secondary | ICD-10-CM | POA: Diagnosis not present

## 2015-05-20 DIAGNOSIS — I10 Essential (primary) hypertension: Secondary | ICD-10-CM

## 2015-05-20 DIAGNOSIS — E559 Vitamin D deficiency, unspecified: Secondary | ICD-10-CM

## 2015-05-20 DIAGNOSIS — E119 Type 2 diabetes mellitus without complications: Secondary | ICD-10-CM

## 2015-05-20 DIAGNOSIS — M549 Dorsalgia, unspecified: Secondary | ICD-10-CM

## 2015-05-20 DIAGNOSIS — B9789 Other viral agents as the cause of diseases classified elsewhere: Secondary | ICD-10-CM

## 2015-05-20 DIAGNOSIS — Z800 Family history of malignant neoplasm of digestive organs: Secondary | ICD-10-CM | POA: Insufficient documentation

## 2015-05-20 DIAGNOSIS — J069 Acute upper respiratory infection, unspecified: Secondary | ICD-10-CM | POA: Insufficient documentation

## 2015-05-20 DIAGNOSIS — E669 Obesity, unspecified: Secondary | ICD-10-CM

## 2015-05-20 DIAGNOSIS — E785 Hyperlipidemia, unspecified: Secondary | ICD-10-CM

## 2015-05-20 DIAGNOSIS — G8929 Other chronic pain: Secondary | ICD-10-CM | POA: Insufficient documentation

## 2015-05-20 MED ORDER — METFORMIN HCL 500 MG PO TABS: 250.0000 mg | ORAL_TABLET | Freq: Two times a day (BID) | ORAL | Status: DC

## 2015-05-20 MED ORDER — SIMVASTATIN 20 MG PO TABS: ORAL_TABLET | ORAL | Status: DC

## 2015-05-20 NOTE — Telephone Encounter (Signed)
This note represents multiple calls between patient, this office, and [facility].   Due to patient not being seen by Dr. Nori Riis since 2010, she must have "re-establish" visit before mammogram can be scheduled. Patient was advised of this information. Patient agreed as this is her preference for place to do mammogram.  Appt scheduled 07/08/15 @ 2:40PM. Patient was advised of this information.

## 2015-05-20 NOTE — Assessment & Plan Note (Signed)
With intermittent vertigo Early in course Disc rest/fluids/expectorant  Nasal saline for congestion Analgesics prn  Reassuring exam Update if not starting to improve in a week or if worsening

## 2015-05-20 NOTE — Assessment & Plan Note (Signed)
Lab Results  Component Value Date   HGBA1C 6.2 05/13/2015   Pt has her eye exam scheduled Continue metformin  Enc wt loss and low glycemic diet

## 2015-05-20 NOTE — Assessment & Plan Note (Signed)
She is due for a dexa  No falls or fx  D level is low again -enc to continue ca and D bid and add 2000 iu daily D3 to that  Also exercise  She will call her gyn office to schedule dexa (so it can be on the same machine)-will call for ref if needed  Disc need for calcium/ vitamin D/ wt bearing exercise and bone density test every 2 y to monitor Disc safety/ fracture risk in detail

## 2015-05-20 NOTE — Assessment & Plan Note (Signed)
Deg disc and joint dz Wants to avoid surgery Sees ortho  She gets norco from them but would like me to take that over since this is a shorter drive to pick up px I require a note from her ortho stating that he will transfer it to me  expl our controlled subs px protocol and contract as well as urine drug screen-she is ok with that  I will wait on the letter

## 2015-05-20 NOTE — Assessment & Plan Note (Signed)
Level is in 74s She takes ca plus D bid and a mvi Asked her to add 2000 iu D3 otc daily to that -agreeable Will continue to follow Disc imp to bone and overall health

## 2015-05-20 NOTE — Progress Notes (Signed)
Pre visit review using our clinic review tool, if applicable. No additional management support is needed unless otherwise documented below in the visit note. 

## 2015-05-20 NOTE — Assessment & Plan Note (Signed)
bp in fair control at this time  BP Readings from Last 1 Encounters:  05/20/15 126/78   No changes needed Disc lifstyle change with low sodium diet and exercise   Labs reviewed

## 2015-05-20 NOTE — Telephone Encounter (Signed)
Notes faxed  Shirlean Mylar,   Can you please fax the notes from Dr. Marliss Coots visit today to Dr. Ulla Gallo at [facility] @ [phone]?   Pt has future appt scheduled for May to have mammogram completed.   Thank you, Katha Cabal

## 2015-05-20 NOTE — Patient Instructions (Signed)
Get your eye exam as planned  Follow up with me in 6 months with labs prior for routine visit  Add 2000 iu of vitamin D daily to what you are already taking You are due for a 5 year colonoscopy in 9/17- if you do not get a reminder from GI -call us in sept for a referral  Here is some info on tetanus shots - you are due for one  I will need a letter from your orthopedic doctor to formally change px for your pain med to me- you will need to sign a pain contract with [place] and be subject to random drug screens, make sure he notes when your last refill was and how many pills per month  Call your gyn office regarding getting a bone density test Stop at check out for referral for mammogram

## 2015-05-20 NOTE — Telephone Encounter (Signed)
Good/thanks for letting me know  

## 2015-05-20 NOTE — Progress Notes (Signed)
Subjective:   Cindy Whitehead is a 80 y.o. female who presents for Medicare Annual (Subsequent) preventive examination.   Cardiac Risk Factors include: advanced age (>47men, >22 women);diabetes mellitus;dyslipidemia;hypertension;obesity (BMI >30kg/m2)     Objective:     Vitals: BP 126/78 mmHg  Pulse 55  Temp(Src) 97.8 F (36.6 C) (Oral)  Ht 4\' 11"  (1.499 m)  Wt 186 lb 8 oz (84.596 kg)  BMI 37.65 kg/m2  SpO2 96%  Tobacco History  Smoking status  . Never Smoker   Smokeless tobacco  . Not on file     Counseling given: No   Past Medical History  Diagnosis Date  . Allergic rhinitis   . Asthma   . Diabetes mellitus   . Hyperlipidemia   . Obesity   . Pes planus   . Bone loss     ? osteopenia   . Stress     cares for mohter who is verbally abusive   . Kidney stones    Past Surgical History  Procedure Laterality Date  . Knee surgery     Family History  Problem Relation Age of Onset  . Colon cancer Mother    History  Sexual Activity  . Sexual Activity: No    Outpatient Encounter Prescriptions as of 05/20/2015  Medication Sig  . albuterol (PROVENTIL HFA;VENTOLIN HFA) 108 (90 BASE) MCG/ACT inhaler Inhale 2 puffs into the lungs every 6 (six) hours as needed for wheezing or shortness of breath.  Marland Kitchen glucose blood test strip Check blood sugar 3 times daily.  Marland Kitchen HYDROcodone-acetaminophen (NORCO/VICODIN) 5-325 MG tablet Take 1 tablet by mouth 2 (two) times daily as needed for severe pain.  . Multiple Vitamin (MULTIVITAMIN WITH MINERALS) TABS tablet Take 1 tablet by mouth daily.  . naproxen (NAPROSYN) 500 MG tablet Take 500 mg by mouth 2 (two) times daily with a meal.  . [DISCONTINUED] HYDROcodone-homatropine (HYCODAN) 5-1.5 MG/5ML syrup Take 5 mLs by mouth every 8 (eight) hours as needed for cough (use caution of sedation).  . [DISCONTINUED] metFORMIN (GLUCOPHAGE) 500 MG tablet TAKE ONE HALF TABLET BY MOUTH TWICE DAILY  . [DISCONTINUED] simvastatin (ZOCOR) 20 MG tablet  TAKE 1/2 TABLET BY MOUTH EVERY EVENING WITH A LOW FAT SNACK  . [DISCONTINUED] azithromycin (ZITHROMAX Z-PAK) 250 MG tablet Take 2 pills by mouth today and then 1 pill daily for 4 days (Patient not taking: Reported on 05/20/2015)   No facility-administered encounter medications on file as of 05/20/2015.    Activities of Daily Living In your present state of health, do you have any difficulty performing the following activities: 05/20/2015  Hearing? N  Vision? N  Difficulty concentrating or making decisions? N  Walking or climbing stairs? N  Dressing or bathing? N  Doing errands, shopping? N  Preparing Food and eating ? N  Using the Toilet? N  In the past six months, have you accidently leaked urine? N  Do you have problems with loss of bowel control? N  Managing your Medications? N  Managing your Finances? N  Housekeeping or managing your Housekeeping? N    Patient Care Team: Abner Greenspan, MD as PCP - General    Assessment:       Hearing Screening   125Hz  250Hz  500Hz  1000Hz  2000Hz  4000Hz  8000Hz   Right ear:   0 40 0 40   Left ear:   40 40 40 40   Vision Screening Comments: Last eye exam with Dr. Delman Cheadle in 2016   Exercise Activities and Dietary recommendations  Current Exercise Habits: Home exercise routine, Type of exercise: walking, Time (Minutes): 30, Frequency (Times/Week): 3, Weekly Exercise (Minutes/Week): 90, Intensity: Mild, Exercise limited by: None identified  Goals    . Reduce sugar intake to X grams per day     Starting 05/20/2015, I will not eat any sweets or treats by limiting access to them in my house.       Fall Risk Fall Risk  05/20/2015 05/13/2014  Falls in the past year? No No   Depression Screen PHQ 2/9 Scores 05/20/2015 05/13/2014  PHQ - 2 Score 0 1     Cognitive Testing MMSE - Mini Mental State Exam 05/20/2015  Orientation to time 5  Orientation to Place 5  Registration 3  Attention/ Calculation 5  Recall 3  Language- name 2 objects 0  Language-  repeat 1  Language- follow 3 step command 3  Language- read & follow direction 1  Write a sentence 0  Copy design 0  Total score 26    Immunization History  Administered Date(s) Administered  . Influenza Whole 03/07/2005, 11/06/2007, 12/05/2008  . Influenza,inj,Quad PF,36+ Mos 01/22/2013, 11/17/2014  . Influenza-Unspecified 11/14/2013  . Pneumococcal Conjugate-13 05/13/2014  . Pneumococcal Polysaccharide-23 11/26/2007  . Td 08/05/2004  . Zoster 10/14/2011   Screening Tests Health Maintenance  Topic Date Due  . MAMMOGRAM  07/20/2015 (Originally 03/07/2009)  . TETANUS/TDAP  04/08/2020 (Originally 08/06/2014)  . OPHTHALMOLOGY EXAM  06/18/2015  . INFLUENZA VACCINE  10/06/2015  . HEMOGLOBIN A1C  11/13/2015  . FOOT EXAM  11/17/2015  . URINE MICROALBUMIN  05/12/2016  . DEXA SCAN  Completed  . ZOSTAVAX  Completed  . PNA vac Low Risk Adult  Completed      Plan:     I have personally reviewed the Medicare Annual Wellness questionnaire and have noted the following in the patient's chart:  A. Medical and social history B. Use of alcohol, tobacco or illicit drugs  C. Current medications and supplements D. Functional ability and status E.  Nutritional status F.  Physical activity G. Advance directives H. List of other physicians I.  Hospitalizations, surgeries, and ER visits in previous 12 months J.  [place] to include hearing, vision, cognitive, depression L. Referrals and appointments - mammogram  In addition, I reviewed preventive protocols, quality metrics, and best practice recommendations specific to patient. A written personalized care plan for preventive services as well as general preventive health recommendations were provided to patient.  See attached scanned questionnaire for additional information.   Signed,   Lindell Noe, MHA, BS, LPN Health Advisor [phone]
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
## 2015-05-20 NOTE — Assessment & Plan Note (Signed)
Disc goals for lipids and reasons to control them Rev labs with pt Rev low sat fat diet in detail Will continue simvastatin

## 2015-05-20 NOTE — Patient Instructions (Signed)
Cindy Whitehead , Thank you for taking time to come for your Medicare Wellness Visit. I appreciate your ongoing commitment to your health goals. Please review the following plan we discussed and let me know if I can assist you in the future.   These are the goals we discussed: Goals    . Reduce sugar intake to X grams per day     Starting 05/20/2015, I will not eat any sweets or treats by limiting access to them in my house.        This is a list of the screening recommended for you and due dates:  Health Maintenance  Topic Date Due  . Mammogram  Will schedule  . Tetanus Vaccine  05/19/2025  . Eye exam for diabetics  06/18/2015  . Flu Shot  10/06/2015  . Hemoglobin A1C  11/13/2015  . Complete foot exam   11/17/2015  . Urine Protein Check  05/12/2016  . DEXA scan (bone density measurement)  Completed  . Shingles Vaccine  Completed  . Pneumonia vaccines  Completed   Preventive Care for Adults  A healthy lifestyle and preventive care can promote health and wellness. Preventive health guidelines for adults include the following key practices.  . A routine yearly physical is a good way to check with your health care provider about your health and preventive screening. It is a chance to share any concerns and updates on your health and to receive a thorough exam.  . Visit your dentist for a routine exam and preventive care every 6 months. Brush your teeth twice a day and floss once a day. Good oral hygiene prevents tooth decay and gum disease.  . The frequency of eye exams is based on your age, health, family medical history, use  of contact lenses, and other factors. Follow your health care provider's ecommendations for frequency of eye exams.  . Eat a healthy diet. Foods like vegetables, fruits, whole grains, low-fat dairy products, and lean protein foods contain the nutrients you need without too many calories. Decrease your intake of foods high in solid fats, added sugars, and salt. Eat  the right amount of calories for you. Get information about a proper diet from your health care provider, if necessary.  . Regular physical exercise is one of the most important things you can do for your health. Most adults should get at least 150 minutes of moderate-intensity exercise (any activity that increases your heart rate and causes you to sweat) each week. In addition, most adults need muscle-strengthening exercises on 2 or more days a week.  Silver Sneakers may be a benefit available to you. To determine eligibility, you may visit the website: www.silversneakers.com or contact program at 912-208-5784 Mon-Fri between 8AM-8PM.   . Maintain a healthy weight. The body mass index (BMI) is a screening tool to identify possible weight problems. It provides an estimate of body fat based on height and weight. Your health care provider can find your BMI and can help you achieve or maintain a healthy weight.   For adults 20 years and older: ? A BMI below 18.5 is considered underweight. ? A BMI of 18.5 to 24.9 is normal. ? A BMI of 25 to 29.9 is considered overweight. ? A BMI of 30 and above is considered obese.   . Maintain normal blood lipids and cholesterol levels by exercising and minimizing your intake of saturated fat. Eat a balanced diet with plenty of fruit and vegetables. Blood tests for lipids and cholesterol should  begin at age 15 and be repeated every 5 years. If your lipid or cholesterol levels are high, you are over 50, or you are at high risk for heart disease, you may need your cholesterol levels checked more frequently. Ongoing high lipid and cholesterol levels should be treated with medicines if diet and exercise are not working.  . If you smoke, find out from your health care provider how to quit. If you do not use tobacco, please do not start.  . If you choose to drink alcohol, please do not consume more than 2 drinks per day. One drink is considered to be 12 ounces (355 mL)  of beer, 5 ounces (148 mL) of wine, or 1.5 ounces (44 mL) of liquor.  . If you are 2-59 years old, ask your health care provider if you should take aspirin to prevent strokes.  . Use sunscreen. Apply sunscreen liberally and repeatedly throughout the day. You should seek shade when your shadow is shorter than you. Protect yourself by wearing long sleeves, pants, a wide-brimmed hat, and sunglasses year round, whenever you are outdoors.  . Once a month, do a whole body skin exam, using a mirror to look at the skin on your back. Tell your health care provider of new moles, moles that have irregular borders, moles that are larger than a pencil eraser, or moles that have changed in shape or color.

## 2015-05-20 NOTE — Assessment & Plan Note (Signed)
Reviewed health habits including diet and exercise and skin cancer prevention Reviewed appropriate screening tests for age  Also reviewed health mt list, fam hx and immunization status , as well as social and family history   Rev AMW visit with Katha Cabal today See HPI Labs reviewed Get your eye exam as planned  Follow up with me in 6 months with labs prior for routine visit  Add 2000 iu of vitamin D daily to what you are already taking You are due for a 5 year colonoscopy in 9/17- if you do not get a reminder from GI -call us in sept for a referral  Here is some info on tetanus shots - you are due for one  I will need a letter from your orthopedic doctor to formally change px for your pain med to me- you will need to sign a pain contract with [place] and be subject to random drug screens, make sure he notes when your last refill was and how many pills per month  Call your gyn office regarding getting a bone density test Stop at check out for referral for mammogram

## 2015-05-20 NOTE — Assessment & Plan Note (Signed)
Discussed how this problem influences overall health and the risks it imposes  Reviewed plan for weight loss with lower calorie diet (via better food choices and also portion control or program like weight watchers) and exercise building up to or more than 30 minutes 5 days per week including some aerobic activity   Commended on work done so far 

## 2015-05-20 NOTE — Assessment & Plan Note (Signed)
Pt is aware she is due for 5 y recall in 9/17 and wants to make her own appt for that  inst her to call us for referral if she is unable to do that

## 2015-05-21 NOTE — Progress Notes (Signed)
   Subjective:    Patient ID: ENNIS CRITE, female    DOB: 17-Jun-1935, 80 y.o.   MRN: [id number]  HPI    Review of Systems     Objective:   Physical Exam        Assessment & Plan:  I reviewed health advisor's note, was available for consultation, and agree with documentation and plan.

## 2015-05-22 DIAGNOSIS — M6250 Muscle wasting and atrophy, not elsewhere classified, unspecified site: Secondary | ICD-10-CM | POA: Diagnosis not present

## 2015-06-29 DIAGNOSIS — E119 Type 2 diabetes mellitus without complications: Secondary | ICD-10-CM | POA: Diagnosis not present

## 2015-06-29 LAB — HM DIABETES EYE EXAM

## 2015-07-16 DIAGNOSIS — Z124 Encounter for screening for malignant neoplasm of cervix: Secondary | ICD-10-CM | POA: Diagnosis not present

## 2015-07-16 DIAGNOSIS — Z01419 Encounter for gynecological examination (general) (routine) without abnormal findings: Secondary | ICD-10-CM | POA: Diagnosis not present

## 2015-07-16 DIAGNOSIS — Z6837 Body mass index (BMI) 37.0-37.9, adult: Secondary | ICD-10-CM | POA: Diagnosis not present

## 2015-07-16 LAB — HM PAP SMEAR: HM PAP: NORMAL

## 2015-07-20 ENCOUNTER — Encounter: Payer: Self-pay | Admitting: Family Medicine

## 2015-07-20 ENCOUNTER — Ambulatory Visit (INDEPENDENT_AMBULATORY_CARE_PROVIDER_SITE_OTHER): Payer: Medicare Other | Admitting: Family Medicine

## 2015-07-20 VITALS — BP 124/72 | HR 71 | Temp 98.2°F | Wt 186.5 lb

## 2015-07-20 DIAGNOSIS — J209 Acute bronchitis, unspecified: Secondary | ICD-10-CM

## 2015-07-20 MED ORDER — HYDROCODONE-HOMATROPINE 5-1.5 MG/5ML PO SYRP: 5.0000 mL | ORAL_SOLUTION | Freq: Three times a day (TID) | ORAL | Status: DC | PRN

## 2015-07-20 MED ORDER — AZITHROMYCIN 250 MG PO TABS: ORAL_TABLET | ORAL | Status: DC
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
## 2015-07-20 NOTE — Progress Notes (Signed)
Pre visit review using our clinic review tool, if applicable. No additional management support is needed unless otherwise documented below in the visit note. 

## 2015-07-21 ENCOUNTER — Ambulatory Visit: Payer: Self-pay | Admitting: Internal Medicine

## 2015-08-24 DIAGNOSIS — Z13820 Encounter for screening for osteoporosis: Secondary | ICD-10-CM | POA: Diagnosis not present

## 2015-08-24 DIAGNOSIS — N958 Other specified menopausal and perimenopausal disorders: Secondary | ICD-10-CM | POA: Diagnosis not present

## 2015-08-24 DIAGNOSIS — M8588 Other specified disorders of bone density and structure, other site: Secondary | ICD-10-CM | POA: Diagnosis not present

## 2015-08-24 DIAGNOSIS — Z1231 Encounter for screening mammogram for malignant neoplasm of breast: Secondary | ICD-10-CM | POA: Diagnosis not present

## 2015-08-24 LAB — HM DEXA SCAN

## 2015-08-24 LAB — HM MAMMOGRAPHY: HM Mammogram: NORMAL (ref 0–4)

## 2015-11-14 ENCOUNTER — Telehealth: Payer: Self-pay | Admitting: Family Medicine

## 2015-11-14 DIAGNOSIS — E785 Hyperlipidemia, unspecified: Secondary | ICD-10-CM

## 2015-11-14 DIAGNOSIS — I10 Essential (primary) hypertension: Secondary | ICD-10-CM

## 2015-11-14 DIAGNOSIS — E119 Type 2 diabetes mellitus without complications: Secondary | ICD-10-CM

## 2015-11-14 NOTE — Telephone Encounter (Signed)
-----   Message from Ellamae Sia sent at 11/11/2015  5:46 PM EDT ----- Regarding: Lab orders for Tuesday, 9.12.17 Lab orders for a 6 month follow up appt

## 2015-11-17 ENCOUNTER — Other Ambulatory Visit: Payer: Self-pay

## 2015-11-18 ENCOUNTER — Other Ambulatory Visit (INDEPENDENT_AMBULATORY_CARE_PROVIDER_SITE_OTHER): Payer: Medicare Other

## 2015-11-18 DIAGNOSIS — I10 Essential (primary) hypertension: Secondary | ICD-10-CM

## 2015-11-18 DIAGNOSIS — E119 Type 2 diabetes mellitus without complications: Secondary | ICD-10-CM

## 2015-11-18 DIAGNOSIS — E785 Hyperlipidemia, unspecified: Secondary | ICD-10-CM

## 2015-11-18 LAB — LIPID PANEL
Cholesterol: 151 mg/dL (ref 0–200)
HDL: 45.4 mg/dL (ref >39.00)
LDL CALC: 77 mg/dL (ref 0–99)
NONHDL: 105.18
Total CHOL/HDL Ratio: 3
Triglycerides: 141 mg/dL (ref 0.0–149.0)
VLDL: 28.2 mg/dL (ref 0.0–40.0)

## 2015-11-18 LAB — COMPREHENSIVE METABOLIC PANEL
ALT: 19 U/L (ref 0–35)
AST: 27 U/L (ref 0–37)
Albumin: 3.8 g/dL (ref 3.5–5.2)
Alkaline Phosphatase: 73 U/L (ref 39–117)
BUN: 16 mg/dL (ref 6–23)
CHLORIDE: 107 meq/L (ref 96–112)
CO2: 29 mEq/L (ref 19–32)
CREATININE: 0.92 mg/dL (ref 0.40–1.20)
Calcium: 9.1 mg/dL (ref 8.4–10.5)
GFR: 62.74 mL/min (ref >60.00)
GLUCOSE: 117 mg/dL — AB (ref 70–99)
POTASSIUM: 4.8 meq/L (ref 3.5–5.1)
SODIUM: 141 meq/L (ref 135–145)
TOTAL PROTEIN: 6.9 g/dL (ref 6.0–8.3)
Total Bilirubin: 0.7 mg/dL (ref 0.2–1.2)

## 2015-11-18 LAB — HEMOGLOBIN A1C: HEMOGLOBIN A1C: 6.1 % (ref 4.6–6.5)

## 2015-11-24 ENCOUNTER — Ambulatory Visit (INDEPENDENT_AMBULATORY_CARE_PROVIDER_SITE_OTHER): Payer: Medicare Other | Admitting: Family Medicine

## 2015-11-24 ENCOUNTER — Encounter: Payer: Self-pay | Admitting: Family Medicine

## 2015-11-24 VITALS — BP 128/70 | HR 70 | Temp 97.7°F | Ht 59.0 in | Wt 190.5 lb

## 2015-11-24 DIAGNOSIS — E119 Type 2 diabetes mellitus without complications: Secondary | ICD-10-CM

## 2015-11-24 DIAGNOSIS — I10 Essential (primary) hypertension: Secondary | ICD-10-CM | POA: Diagnosis not present

## 2015-11-24 DIAGNOSIS — E785 Hyperlipidemia, unspecified: Secondary | ICD-10-CM | POA: Diagnosis not present

## 2015-11-24 DIAGNOSIS — Z23 Encounter for immunization: Secondary | ICD-10-CM | POA: Diagnosis not present

## 2015-11-24 DIAGNOSIS — E669 Obesity, unspecified: Secondary | ICD-10-CM

## 2015-11-24 DIAGNOSIS — N3941 Urge incontinence: Secondary | ICD-10-CM | POA: Diagnosis not present

## 2015-11-24 LAB — POC URINALSYSI DIPSTICK (AUTOMATED)
BILIRUBIN UA: NEGATIVE
Blood, UA: NEGATIVE
GLUCOSE UA: NEGATIVE
KETONES UA: NEGATIVE
LEUKOCYTES UA: NEGATIVE
NITRITE UA: NEGATIVE
Protein, UA: NEGATIVE
UROBILINOGEN UA: 0.2
pH, UA: 6

## 2015-11-24 MED ORDER — SOLIFENACIN SUCCINATE 10 MG PO TABS: 10.0000 mg | ORAL_TABLET | Freq: Every day | ORAL | 11 refills | Status: DC

## 2015-11-24 NOTE — Progress Notes (Signed)
Pre visit review using our clinic review tool, if applicable. No additional management support is needed unless otherwise documented below in the visit note. 

## 2015-11-24 NOTE — Patient Instructions (Addendum)
Leave a urine specimen on the way out  Flu shot today Here is a px for vesicare (for overactive bladder and urge incontinence)- it may help urinary symptoms  If urine test is neg- you can fill it and try it  Keep up exercise  Work on weight loss  Follow up for annual exam in 6 months

## 2015-11-24 NOTE — Assessment & Plan Note (Signed)
ua is neg Will try vesicare 10 mg  Disc poss side eff incl dry mouth/dizziness/sedation and constipation to watch for  Pt will update with response Wt loss would also help

## 2015-11-24 NOTE — Assessment & Plan Note (Signed)
Lab Results  Component Value Date   HGBA1C 6.1 11/18/2015   Controlled with metformin and diet  Wt loss enc Cannot take ace  F/u 6 mo

## 2015-11-24 NOTE — Assessment & Plan Note (Signed)
bp in fair control at this time  BP Readings from Last 1 Encounters:  11/24/15 128/70   No changes needed Disc lifstyle change with low sodium diet and exercise  Labs reviewed  Wt loss enc

## 2015-11-24 NOTE — Progress Notes (Signed)
Subjective:    Patient ID: Cindy Whitehead, female    DOB: 1935-08-21, 80 y.o.   MRN: [id number]  HPI Here for f/u of chronic medical problems  Urgency - wets herself if she does not get to the bathroom in time  No dyruria or odor  Does not feel like there is infection-feels fine  Some frequency during the day- has to watch her intake   Feeling ok overall   Has to urinate more often at night   Wt Readings from Last 3 Encounters:  11/24/15 190 lb 8 oz (86.4 kg)  07/20/15 186 lb 8 oz (84.6 kg)  05/20/15 186 lb 8 oz (84.6 kg)  wt is up a bit  Eating later than she used to - she stays up with her daughters when they come home from work  She sticks to a DM diet most of the time  Craves sugar - occ sweet but not often  She thinks she needs to cut back her servings also  For exercise - doing some walking- about 30 minutes per day  bmi is 38.4   bp is stable today  No cp or palpitations or headaches or edema  No side effects to medicines  BP Readings from Last 3 Encounters:  11/24/15 134/72  07/20/15 124/72  05/20/15 126/78     Hx of hyperlipidemia Lab Results  Component Value Date   CHOL 151 11/18/2015   CHOL 165 05/13/2015   CHOL 165 11/11/2014   Lab Results  Component Value Date   HDL 45.40 11/18/2015   HDL 47.70 05/13/2015   HDL 46.90 11/11/2014   Lab Results  Component Value Date   LDLCALC 77 11/18/2015   LDLCALC 84 05/13/2015   LDLCALC 56 05/06/2014   Lab Results  Component Value Date   TRIG 141.0 11/18/2015   TRIG 167.0 (H) 05/13/2015   TRIG 204.0 (H) 11/11/2014   Lab Results  Component Value Date   CHOLHDL 3 11/18/2015   CHOLHDL 3 05/13/2015   CHOLHDL 4 11/11/2014   Lab Results  Component Value Date   LDLDIRECT 94.0 11/11/2014   LDLDIRECT 125.8 09/23/2008   LDLDIRECT 130.3 05/21/2008   On simvastatin and diet  Doing well   Diabetes Home sugar results -lost her meter  DM diet - working on that  Exercise -30 minutes a day  Symptoms-none    A1C last  Lab Results  Component Value Date   HGBA1C 6.1 11/18/2015  very well controlled  Was 6.2 No problems with medications -metformin  Renal protection-cannot take ace Lab Results  Component Value Date   MICROALBUR 3.9 (H) 05/13/2015     Last eye exam  -April and another one in oct upcoming   Results for orders placed or performed in visit on 11/24/15  HM MAMMOGRAPHY  Result Value Ref Range   HM Mammogram Self Reported Normal 0-4 Bi-Rad, Self Reported Normal  HM DEXA SCAN  Result Value Ref Range   HM Dexa Scan gyn   HM PAP SMEAR  Result Value Ref Range   HM Pap smear normal gyn   POCT Urinalysis Dipstick (Automated)  Result Value Ref Range   Color, UA Yellow    Clarity, UA Clear    Glucose, UA Negative    Bilirubin, UA Negative    Ketones, UA Negative    Spec Grav, UA >=1.030    Blood, UA Negative    pH, UA 6.0    Protein, UA negative    Urobilinogen, UA  0.2    Nitrite, UA Negative    Leukocytes, UA Negative Negative      Patient Active Problem List   Diagnosis Date Noted  . Urge incontinence 11/24/2015  . Routine general medical examination at a health care facility 05/20/2015  . Family history of colon cancer 05/20/2015  . Chronic back pain 05/20/2015  . Low back pain radiating to right leg 09/02/2014  . Tendonitis of finger 06/03/2014  . Encounter for Medicare annual wellness exam 05/13/2014  . Adjustment disorder with mixed anxiety and depressed mood 10/29/2013  . GERD (gastroesophageal reflux disease) 10/13/2010  . Vitamin D deficiency 04/14/2010  . Osteopenia 05/26/2008  . ESSENTIAL HYPERTENSION, BENIGN 05/21/2008  . Obesity 08/22/2007  . REACTION, ACUTE STRESS W/EMOTIONAL DSTURB 09/14/2006  . Diabetes type 2, controlled ([place]) 05/11/2006  . Hyperlipidemia 05/11/2006  . ALLERGIC RHINITIS 05/11/2006  . ASTHMA 05/11/2006   Past Medical History:  Diagnosis Date  . Allergic rhinitis   . Asthma   . Bone loss    ? osteopenia   . Diabetes  mellitus   . Hyperlipidemia   . Kidney stones   . Obesity   . Pes planus   . Stress    cares for mohter who is verbally abusive    Past Surgical History:  Procedure Laterality Date  . KNEE SURGERY     Social History  Substance Use Topics  . Smoking status: Never Smoker  . Smokeless tobacco: Never Used  . Alcohol use No   Family History  Problem Relation Age of Onset  . Colon cancer Mother    Allergies  Allergen Reactions  . Atorvastatin Other (See Comments)    REACTION: leg pain  . Ace Inhibitors Other (See Comments)    cough   Current Outpatient Prescriptions on File Prior to Visit  Medication Sig Dispense Refill  . albuterol (PROVENTIL HFA;VENTOLIN HFA) 108 (90 BASE) MCG/ACT inhaler Inhale 2 puffs into the lungs every 6 (six) hours as needed for wheezing or shortness of breath. 1 Inhaler 1  . glucose blood test strip Check blood sugar 3 times daily. 100 each 2  . metFORMIN (GLUCOPHAGE) 500 MG tablet Take 0.5 tablets (250 mg total) by mouth 2 (two) times daily. 90 tablet 3  . Multiple Vitamin (MULTIVITAMIN WITH MINERALS) TABS tablet Take 1 tablet by mouth daily.    . naproxen (NAPROSYN) 500 MG tablet Take 500 mg by mouth 2 (two) times daily with a meal.    . simvastatin (ZOCOR) 20 MG tablet TAKE 1/2 TABLET BY MOUTH EVERY EVENING WITH A LOW FAT SNACK 45 tablet 3   No current facility-administered medications on file prior to visit.     Review of Systems    Review of Systems  Constitutional: Negative for fever, appetite change, fatigue and unexpected weight change.  Eyes: Negative for pain and visual disturbance.  Respiratory: Negative for cough and shortness of breath.   Cardiovascular: Negative for cp or palpitations    Gastrointestinal: Negative for nausea, diarrhea and constipation.  Genitourinary: pos for urgency and frequency. neg for hematuria or flank pain or dysuria  Skin: Negative for pallor or rash   Neurological: Negative for weakness, light-headedness,  numbness and headaches.  Hematological: Negative for adenopathy. Does not bruise/bleed easily.  Psychiatric/Behavioral: Negative for dysphoric mood. The patient is not nervous/anxious.      Objective:   Physical Exam  Constitutional: She is oriented to person, place, and time. She appears well-developed and well-nourished. No distress.  obese and well  appearing   HENT:  Head: Normocephalic and atraumatic.  Right Ear: External ear normal.  Left Ear: External ear normal.  Nose: Nose normal.  Mouth/Throat: Oropharynx is clear and moist. No oropharyngeal exudate.  No sinus tenderness No temporal tenderness  No TMJ tenderness  Eyes: Conjunctivae and EOM are normal. Pupils are equal, round, and reactive to light. Right eye exhibits no discharge. Left eye exhibits no discharge. No scleral icterus.  No nystagmus  Neck: Normal range of motion and full passive range of motion without pain. Neck supple. No JVD present. Carotid bruit is not present. No tracheal deviation present. No thyromegaly present.  Cardiovascular: Normal rate, regular rhythm and normal heart sounds.   No murmur heard. Pulmonary/Chest: Effort normal and breath sounds normal. No respiratory distress. She has no wheezes. She has no rales.  Abdominal: Soft. Bowel sounds are normal. She exhibits no distension and no mass. There is no tenderness.  Musculoskeletal: She exhibits no edema or tenderness.  Lymphadenopathy:    She has no cervical adenopathy.  Neurological: She is alert and oriented to person, place, and time. She has normal strength and normal reflexes. She displays no atrophy and no tremor. No cranial nerve deficit or sensory deficit. She exhibits normal muscle tone. She displays a negative Romberg sign. Coordination and gait normal.  No focal cerebellar signs   Skin: Skin is warm and dry. No rash noted. No pallor.  Psychiatric: She has a normal mood and affect. Her behavior is normal. Thought content normal.           Assessment & Plan:

## 2015-11-24 NOTE — Assessment & Plan Note (Signed)
Discussed how this problem influences overall health and the risks it imposes  Reviewed plan for weight loss with lower calorie diet (via better food choices and also portion control or program like weight watchers) and exercise building up to or more than 30 minutes 5 days per week including some aerobic activity    

## 2015-12-28 DIAGNOSIS — E119 Type 2 diabetes mellitus without complications: Secondary | ICD-10-CM | POA: Diagnosis not present

## 2016-02-25 IMAGING — CR DG CHEST 2V
2 series · 2 of 2 positions shown · non-contrast
Comparison: Chest x-ray 04/15/2006.

CLINICAL DATA: 79-year-old female with history productive cough for
the past 5 days, with intermittent fever, shortness of breath and
chest pain. History of asthma.

EXAM:
CHEST  2 VIEW

[view not recorded (1 of 2)]
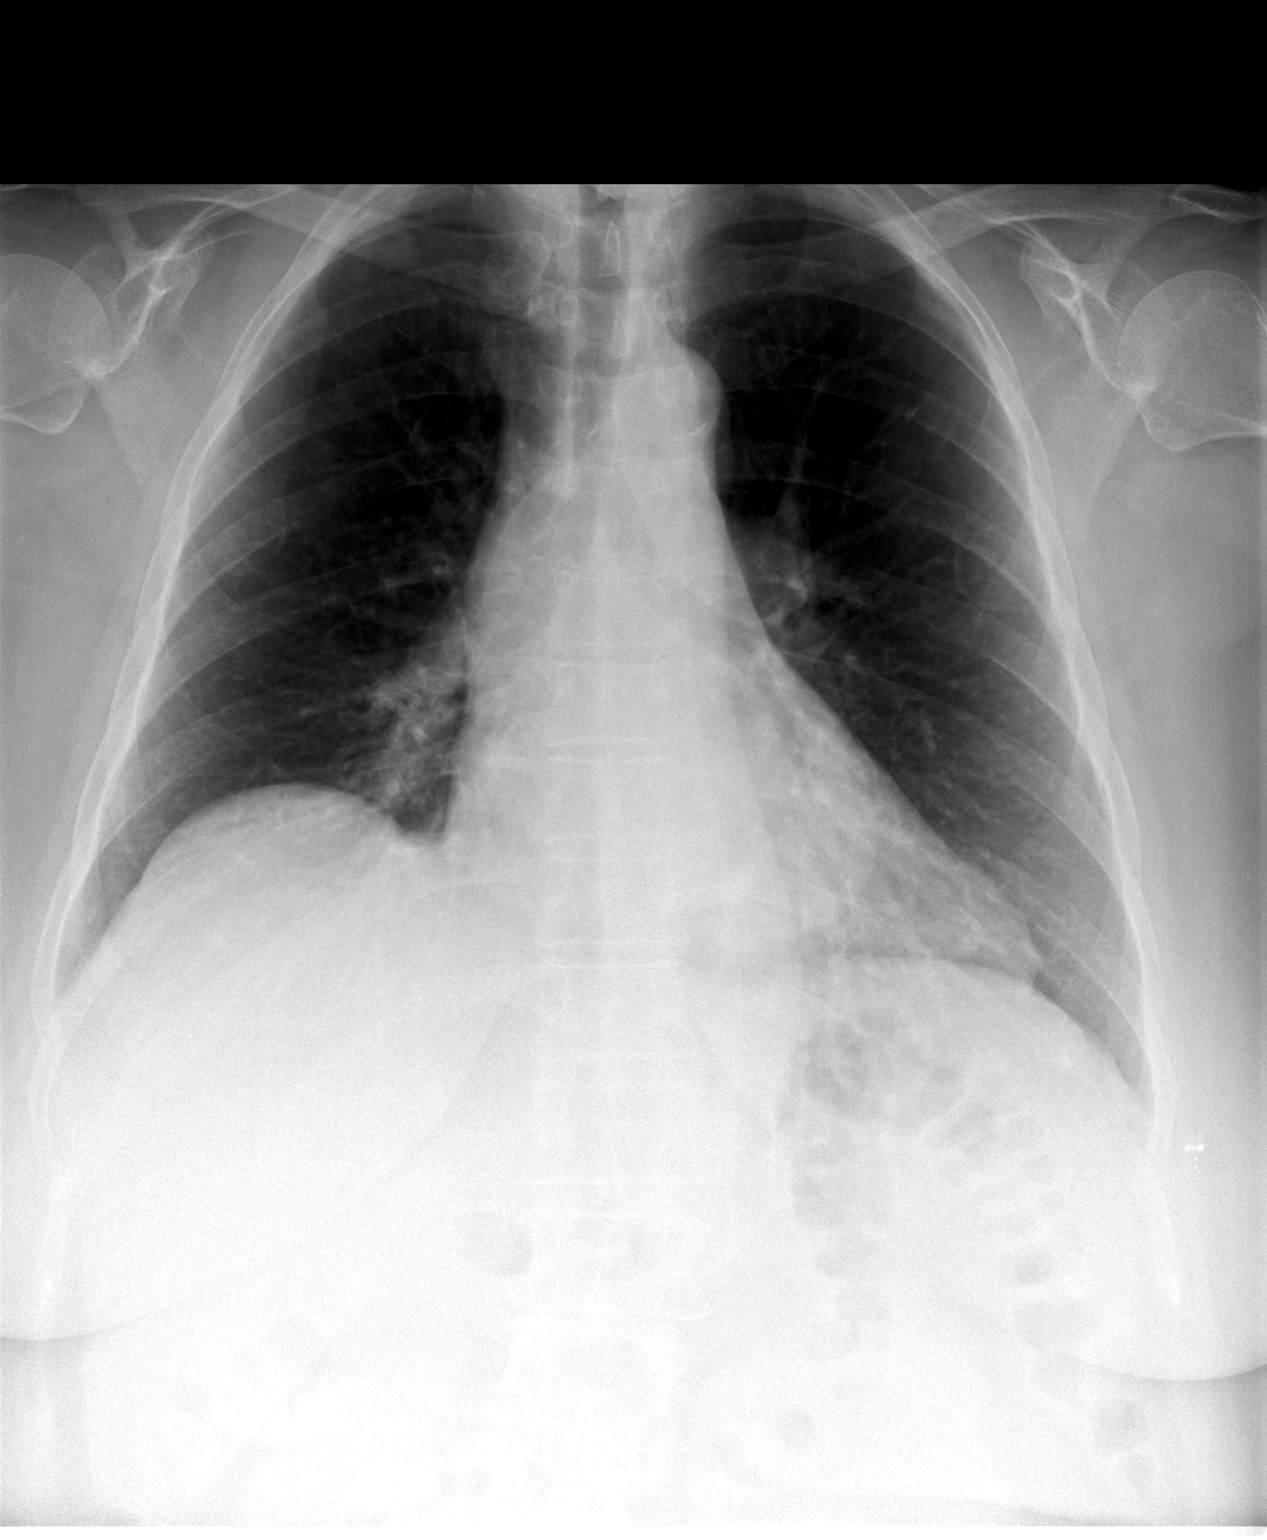

[view not recorded (2 of 2)]
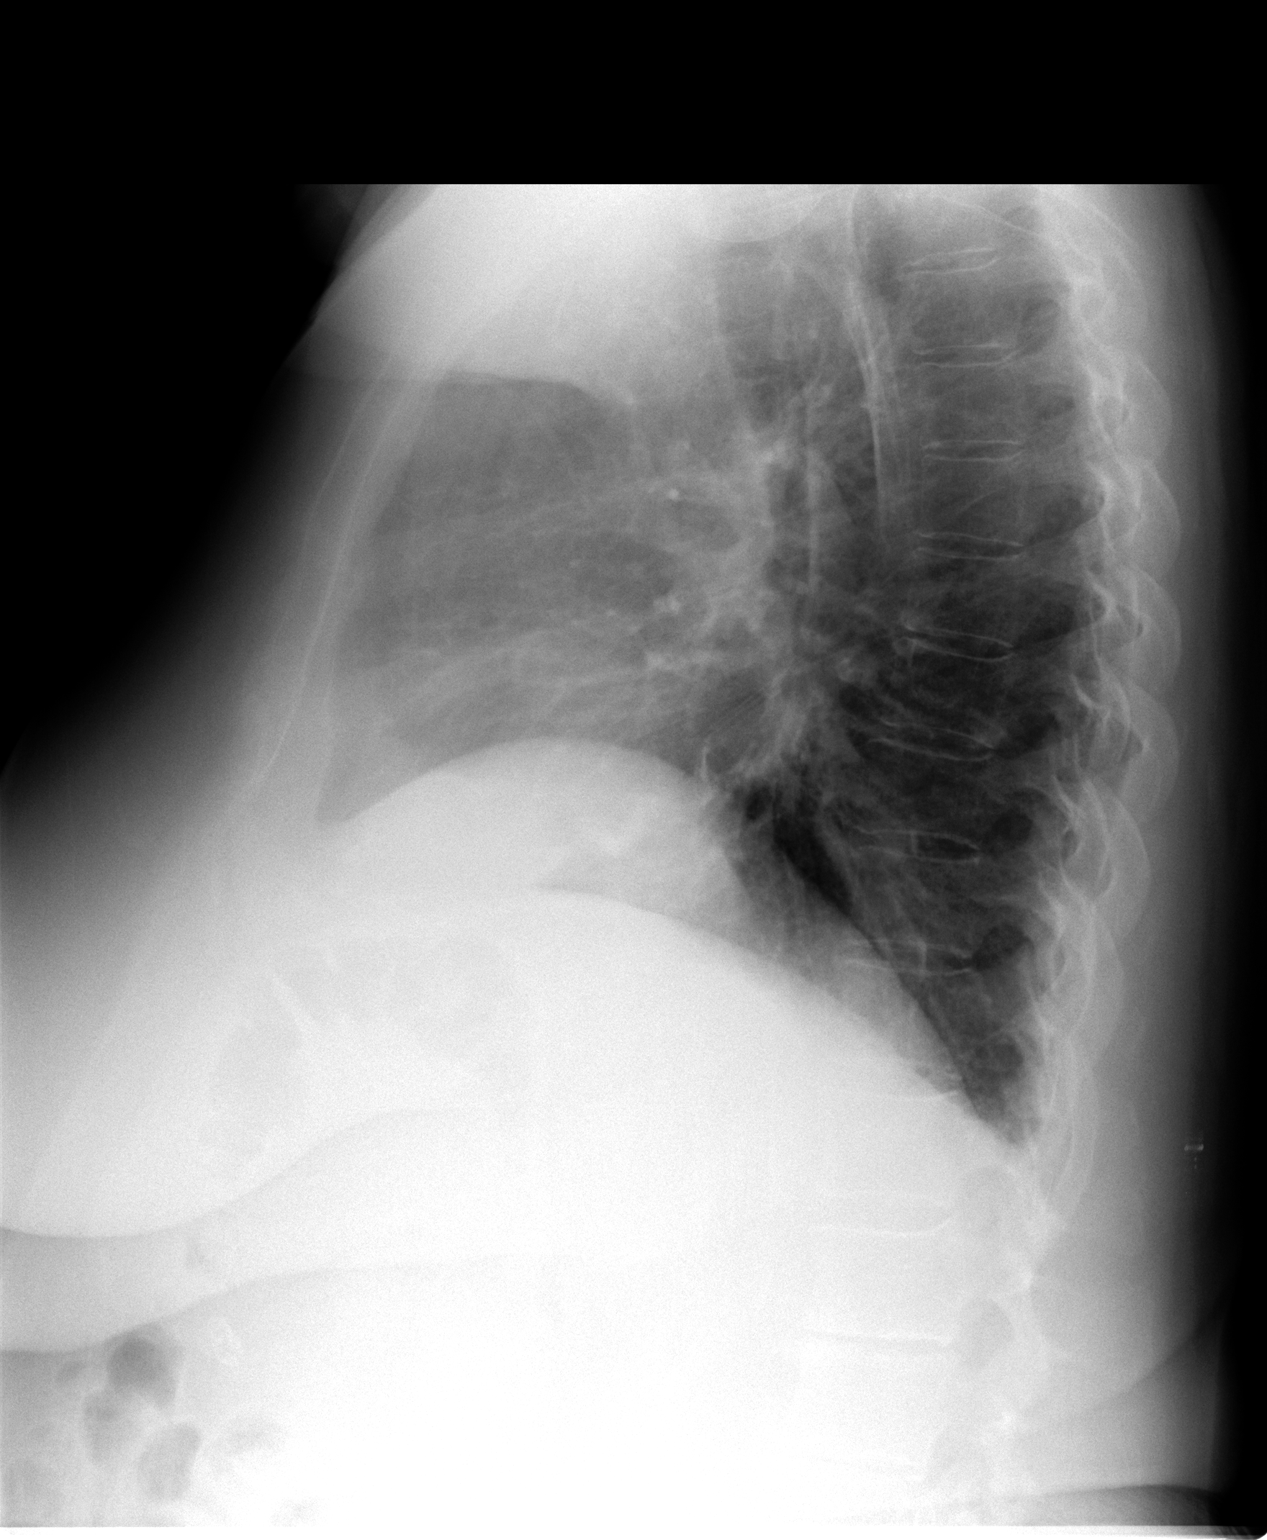

[2 of 2 positions shown; findings below may reference images not displayed]

FINDINGS: Lung volumes are normal. No consolidative airspace disease. No
pleural effusions. No pneumothorax. No pulmonary nodule or mass
noted. Pulmonary vasculature and the cardiomediastinal silhouette
are within normal limits. Atherosclerosis in the thoracic aorta.
IMPRESSION: 1. No radiographic evidence of acute cardiopulmonary disease.
2. Atherosclerosis.

## 2016-06-03 ENCOUNTER — Telehealth: Payer: Self-pay | Admitting: Family Medicine

## 2016-06-03 DIAGNOSIS — Z0000 Encounter for general adult medical examination without abnormal findings: Secondary | ICD-10-CM

## 2016-06-03 DIAGNOSIS — E119 Type 2 diabetes mellitus without complications: Secondary | ICD-10-CM

## 2016-06-03 DIAGNOSIS — E559 Vitamin D deficiency, unspecified: Secondary | ICD-10-CM

## 2016-06-03 NOTE — Telephone Encounter (Signed)
Lab order

## 2016-06-09 ENCOUNTER — Other Ambulatory Visit: Payer: Medicare Other

## 2016-06-09 ENCOUNTER — Ambulatory Visit (INDEPENDENT_AMBULATORY_CARE_PROVIDER_SITE_OTHER): Payer: Medicare Other

## 2016-06-09 VITALS — BP 118/78 | HR 83 | Temp 97.7°F | Ht <= 58 in | Wt 190.0 lb

## 2016-06-09 DIAGNOSIS — E119 Type 2 diabetes mellitus without complications: Secondary | ICD-10-CM | POA: Diagnosis not present

## 2016-06-09 DIAGNOSIS — E559 Vitamin D deficiency, unspecified: Secondary | ICD-10-CM

## 2016-06-09 DIAGNOSIS — Z0000 Encounter for general adult medical examination without abnormal findings: Secondary | ICD-10-CM

## 2016-06-09 LAB — CBC WITH DIFFERENTIAL/PLATELET
BASOS PCT: 1.2 % (ref 0.0–3.0)
Basophils Absolute: 0.1 10*3/uL (ref 0.0–0.1)
Eosinophils Absolute: 0.2 10*3/uL (ref 0.0–0.7)
Eosinophils Relative: 3.1 % (ref 0.0–5.0)
HEMATOCRIT: 43 % (ref 36.0–46.0)
Hemoglobin: 14.7 g/dL (ref 12.0–15.0)
LYMPHS PCT: 29.4 % (ref 12.0–46.0)
Lymphs Abs: 1.7 10*3/uL (ref 0.7–4.0)
MCHC: 34.1 g/dL (ref 30.0–36.0)
MCV: 94.2 fl (ref 78.0–100.0)
MONOS PCT: 5.8 % (ref 3.0–12.0)
Monocytes Absolute: 0.3 10*3/uL (ref 0.1–1.0)
NEUTROS ABS: 3.5 10*3/uL (ref 1.4–7.7)
Neutrophils Relative %: 60.5 % (ref 43.0–77.0)
PLATELETS: 198 10*3/uL (ref 150.0–400.0)
RBC: 4.57 Mil/uL (ref 3.87–5.11)
RDW: 12.7 % (ref 11.5–15.5)
WBC: 5.8 10*3/uL (ref 4.0–10.5)

## 2016-06-09 LAB — LIPID PANEL
CHOL/HDL RATIO: 4
CHOLESTEROL: 165 mg/dL (ref 0–200)
HDL: 46.6 mg/dL (ref >39.00)
LDL Cholesterol: 87 mg/dL (ref 0–99)
NonHDL: 118.3
TRIGLYCERIDES: 159 mg/dL — AB (ref 0.0–149.0)
VLDL: 31.8 mg/dL (ref 0.0–40.0)

## 2016-06-09 LAB — COMPREHENSIVE METABOLIC PANEL
ALT: 18 U/L (ref 0–35)
AST: 24 U/L (ref 0–37)
Albumin: 3.9 g/dL (ref 3.5–5.2)
Alkaline Phosphatase: 61 U/L (ref 39–117)
BUN: 20 mg/dL (ref 6–23)
CO2: 25 mEq/L (ref 19–32)
Calcium: 9.4 mg/dL (ref 8.4–10.5)
Chloride: 108 mEq/L (ref 96–112)
Creatinine, Ser: 0.86 mg/dL (ref 0.40–1.20)
GFR: 67.72 mL/min (ref >60.00)
Glucose, Bld: 121 mg/dL — ABNORMAL HIGH (ref 70–99)
Potassium: 4.6 mEq/L (ref 3.5–5.1)
Sodium: 141 mEq/L (ref 135–145)
Total Bilirubin: 0.8 mg/dL (ref 0.2–1.2)
Total Protein: 7 g/dL (ref 6.0–8.3)

## 2016-06-09 LAB — TSH: TSH: 2.12 u[IU]/mL (ref 0.35–4.50)

## 2016-06-09 LAB — MICROALBUMIN / CREATININE URINE RATIO
Creatinine,U: 344.6 mg/dL
MICROALB UR: 4.3 mg/dL — AB (ref 0.0–1.9)
Microalb Creat Ratio: 1.2 mg/g (ref 0.0–30.0)

## 2016-06-09 LAB — HEMOGLOBIN A1C: Hgb A1c MFr Bld: 6.1 % (ref 4.6–6.5)

## 2016-06-09 LAB — VITAMIN D 25 HYDROXY (VIT D DEFICIENCY, FRACTURES): VITD: 23.65 ng/mL — ABNORMAL LOW (ref 30.00–100.00)

## 2016-06-09 NOTE — Progress Notes (Signed)
Pre visit review using our clinic review tool, if applicable. No additional management support is needed unless otherwise documented below in the visit note. 

## 2016-06-09 NOTE — Progress Notes (Signed)
Subjective:   Cindy Whitehead is a 81 y.o. female who presents for Medicare Annual (Subsequent) preventive examination.  Review of Systems:  N/A Cardiac Risk Factors include: advanced age (>56men, >66 women);obesity (BMI >30kg/m2);dyslipidemia;hypertension;diabetes mellitus     Objective:     Vitals: BP 118/78 (BP Location: Right Arm, Patient Position: Sitting, Cuff Size: Normal)   Pulse 83   Temp 97.7 F (36.5 C) (Oral)   Ht 4\' 10"  (1.473 m)   Wt 190 lb (86.2 kg)   SpO2 98%   BMI 39.71 kg/m   Body mass index is 39.71 kg/m.   Tobacco History  Smoking Status  . Never Smoker  Smokeless Tobacco  . Never Used     Counseling given: No   Past Medical History:  Diagnosis Date  . Allergic rhinitis   . Asthma   . Bone loss    ? osteopenia   . Diabetes mellitus   . Hyperlipidemia   . Kidney stones   . Obesity   . Pes planus   . Stress    cares for mohter who is verbally abusive    Past Surgical History:  Procedure Laterality Date  . KNEE SURGERY     Family History  Problem Relation Age of Onset  . Colon cancer Mother    History  Sexual Activity  . Sexual activity: No    Outpatient Encounter Prescriptions as of 06/09/2016  Medication Sig  . albuterol (PROVENTIL HFA;VENTOLIN HFA) 108 (90 BASE) MCG/ACT inhaler Inhale 2 puffs into the lungs every 6 (six) hours as needed for wheezing or shortness of breath.  Marland Kitchen glucose blood test strip Check blood sugar 3 times daily.  . metFORMIN (GLUCOPHAGE) 500 MG tablet Take 0.5 tablets (250 mg total) by mouth 2 (two) times daily.  . Multiple Vitamin (MULTIVITAMIN WITH MINERALS) TABS tablet Take 1 tablet by mouth daily.  . naproxen (NAPROSYN) 500 MG tablet Take 500 mg by mouth as needed.   . simvastatin (ZOCOR) 20 MG tablet TAKE 1/2 TABLET BY MOUTH EVERY EVENING WITH A LOW FAT SNACK  . solifenacin (VESICARE) 10 MG tablet Take 1 tablet (10 mg total) by mouth daily.   No facility-administered encounter medications on file as of  06/09/2016.     Activities of Daily Living In your present state of health, do you have any difficulty performing the following activities: 06/09/2016  Hearing? N  Vision? N  Difficulty concentrating or making decisions? N  Walking or climbing stairs? N  Dressing or bathing? N  Doing errands, shopping? N  Preparing Food and eating ? N  Using the Toilet? N  In the past six months, have you accidently leaked urine? N  Do you have problems with loss of bowel control? N  Managing your Medications? N  Managing your Finances? N  Housekeeping or managing your Housekeeping? N  Some recent data might be hidden    Patient Care Team: Abner Greenspan, MD as PCP - General    Assessment:     Hearing Screening   125Hz  250Hz  500Hz  1000Hz  2000Hz  3000Hz  4000Hz  6000Hz  8000Hz   Right ear:   40 0 40  0    Left ear:   40 0 40  40    Vision Screening Comments: Vision exams every 6 mths; future appt scheduled 06/27/16   Exercise Activities and Dietary recommendations Current Exercise Habits: Home exercise routine, Type of exercise: walking, Time (Minutes): 30, Frequency (Times/Week): 7, Weekly Exercise (Minutes/Week): 210, Intensity: Mild, Exercise limited by: None  identified  Goals    . Reduce sugar intake to X grams per day          Starting 05/20/2015, I will not eat any sweets or treats by limiting access to them in my house.       Fall Risk Fall Risk  06/09/2016 05/20/2015 05/13/2014  Falls in the past year? Yes No No  Number falls in past yr: 1 - -  Injury with Fall? Yes - -   Depression Screen PHQ 2/9 Scores 06/09/2016 05/20/2015 05/13/2014  PHQ - 2 Score 0 0 1     Cognitive Function MMSE - Mini Mental State Exam 06/09/2016 05/20/2015  Orientation to time 5 5  Orientation to Place 5 5  Registration 3 3  Attention/ Calculation 0 5  Recall 3 3  Language- name 2 objects 0 0  Language- repeat 1 1  Language- follow 3 step command 3 3  Language- read & follow direction 0 1  Write a sentence 0 0    Copy design 0 0  Total score 20 26       PLEASE NOTE: A Mini-Cog screen was completed. Maximum score is 20. A value of 0 denotes this part of [place] MMSE was not completed or the patient failed this part of the Mini-Cog screening.   Mini-Cog Screening Orientation to Time - Max 5 pts Orientation to Place - Max 5 pts Registration - Max 3 pts Recall - Max 3 pts Language Repeat - Max 1 pts Language Follow 3 Step Command - Max 3 pts   Immunization History  Administered Date(s) Administered  . Influenza Whole 03/07/2005, 11/06/2007, 12/05/2008  . Influenza,inj,Quad PF,36+ Mos 01/22/2013, 11/17/2014, 11/24/2015  . Influenza-Unspecified 11/14/2013  . Pneumococcal Conjugate-13 05/13/2014  . Pneumococcal Polysaccharide-23 11/26/2007  . Td 08/05/2004  . Zoster 10/14/2011   Screening Tests Health Maintenance  Topic Date Due  . TETANUS/TDAP  04/08/2020 (Originally 08/06/2014)  . OPHTHALMOLOGY EXAM  06/28/2016  . MAMMOGRAM  08/23/2016  . INFLUENZA VACCINE  10/05/2016  . FOOT EXAM  11/23/2016  . HEMOGLOBIN A1C  12/09/2016  . URINE MICROALBUMIN  06/09/2017  . DEXA SCAN  Completed  . PNA vac Low Risk Adult  Completed      Plan:     I have personally reviewed and addressed the Medicare Annual Wellness questionnaire and have noted the following in the patient's chart:  A. Medical and social history B. Use of alcohol, tobacco or illicit drugs  C. Current medications and supplements D. Functional ability and status E.  Nutritional status F.  Physical activity G. Advance directives H. List of other physicians I.  Hospitalizations, surgeries, and ER visits in previous 12 months J.  [place] to include hearing, vision, cognitive, depression L. Referrals and appointments - none  In addition, I have reviewed and discussed with patient certain preventive protocols, quality metrics, and best practice recommendations. A written personalized care plan for preventive services as  well as general preventive health recommendations were provided to patient.  See attached scanned questionnaire for additional information.   Signed,   Lindell Noe, MHA, BS, LPN Health Coach'

## 2016-06-09 NOTE — Progress Notes (Signed)
I reviewed health advisor's note, was available for consultation, and agree with documentation and plan.  

## 2016-06-09 NOTE — Progress Notes (Signed)
PCP notes:   Health maintenance:  A1C - completed Urine microalbumin - completed  Abnormal screenings:   Hearing - failed Fall risk - hx of fall with injury  Patient concerns:   None  Nurse concerns:  None  Next PCP appt:   4/10 @ 1130

## 2016-06-09 NOTE — Patient Instructions (Signed)
Ms. Art , Thank you for taking time to come for your Medicare Wellness Visit. I appreciate your ongoing commitment to your health goals. Please review the following plan we discussed and let me know if I can assist you in the future.   These are the goals we discussed: Goals    . Reduce sugar intake to X grams per day          Starting 06/09/2016, I continue to monitor intake of simple carbohydrates by limiting access to them in my house.        This is a list of the screening recommended for you and due dates:  Health Maintenance  Topic Date Due  . Tetanus Vaccine  04/08/2020*  . Eye exam for diabetics  06/28/2016  . Mammogram  08/23/2016  . Flu Shot  10/05/2016  . Complete foot exam   11/23/2016  . Hemoglobin A1C  12/09/2016  . Urine Protein Check  06/09/2017  . DEXA scan (bone density measurement)  Completed  . Pneumonia vaccines  Completed  *Topic was postponed. The date shown is not the original due date.   Preventive Care for Adults  A healthy lifestyle and preventive care can promote health and wellness. Preventive health guidelines for adults include the following key practices.  . A routine yearly physical is a good way to check with your health care provider about your health and preventive screening. It is a chance to share any concerns and updates on your health and to receive a thorough exam.  . Visit your dentist for a routine exam and preventive care every 6 months. Brush your teeth twice a day and floss once a day. Good oral hygiene prevents tooth decay and gum disease.  . The frequency of eye exams is based on your age, health, family medical history, use  of contact lenses, and other factors. Follow your health care provider's ecommendations for frequency of eye exams.  . Eat a healthy diet. Foods like vegetables, fruits, whole grains, low-fat dairy products, and lean protein foods contain the nutrients you need without too many calories. Decrease your intake of  foods high in solid fats, added sugars, and salt. Eat the right amount of calories for you. Get information about a proper diet from your health care provider, if necessary.  . Regular physical exercise is one of the most important things you can do for your health. Most adults should get at least 150 minutes of moderate-intensity exercise (any activity that increases your heart rate and causes you to sweat) each week. In addition, most adults need muscle-strengthening exercises on 2 or more days a week.  Silver Sneakers may be a benefit available to you. To determine eligibility, you may visit the website: www.silversneakers.com or contact program at 213-131-1744 Mon-Fri between 8AM-8PM.   . Maintain a healthy weight. The body mass index (BMI) is a screening tool to identify possible weight problems. It provides an estimate of body fat based on height and weight. Your health care provider can find your BMI and can help you achieve or maintain a healthy weight.   For adults 20 years and older: ? A BMI below 18.5 is considered underweight. ? A BMI of 18.5 to 24.9 is normal. ? A BMI of 25 to 29.9 is considered overweight. ? A BMI of 30 and above is considered obese.   . Maintain normal blood lipids and cholesterol levels by exercising and minimizing your intake of saturated fat. Eat a balanced diet with plenty  of fruit and vegetables. Blood tests for lipids and cholesterol should begin at age 15 and be repeated every 5 years. If your lipid or cholesterol levels are high, you are over 50, or you are at high risk for heart disease, you may need your cholesterol levels checked more frequently. Ongoing high lipid and cholesterol levels should be treated with medicines if diet and exercise are not working.  . If you smoke, find out from your health care provider how to quit. If you do not use tobacco, please do not start.  . If you choose to drink alcohol, please do not consume more than 2 drinks per  day. One drink is considered to be 12 ounces (355 mL) of beer, 5 ounces (148 mL) of wine, or 1.5 ounces (44 mL) of liquor.  . If you are 45-30 years old, ask your health care provider if you should take aspirin to prevent strokes.  . Use sunscreen. Apply sunscreen liberally and repeatedly throughout the day. You should seek shade when your shadow is shorter than you. Protect yourself by wearing long sleeves, pants, a wide-brimmed hat, and sunglasses year round, whenever you are outdoors.  . Once a month, do a whole body skin exam, using a mirror to look at the skin on your back. Tell your health care provider of new moles, moles that have irregular borders, moles that are larger than a pencil eraser, or moles that have changed in shape or color.

## 2016-06-14 ENCOUNTER — Encounter: Payer: Self-pay | Admitting: Family Medicine

## 2016-06-14 ENCOUNTER — Ambulatory Visit (INDEPENDENT_AMBULATORY_CARE_PROVIDER_SITE_OTHER): Payer: Medicare Other | Admitting: Family Medicine

## 2016-06-14 VITALS — BP 122/78 | HR 73 | Temp 97.7°F | Ht <= 58 in | Wt 190.0 lb

## 2016-06-14 DIAGNOSIS — M545 Low back pain: Secondary | ICD-10-CM

## 2016-06-14 DIAGNOSIS — E6609 Other obesity due to excess calories: Secondary | ICD-10-CM

## 2016-06-14 DIAGNOSIS — IMO0001 Reserved for inherently not codable concepts without codable children: Secondary | ICD-10-CM

## 2016-06-14 DIAGNOSIS — E559 Vitamin D deficiency, unspecified: Secondary | ICD-10-CM | POA: Diagnosis not present

## 2016-06-14 DIAGNOSIS — E7800 Pure hypercholesterolemia, unspecified: Secondary | ICD-10-CM | POA: Diagnosis not present

## 2016-06-14 DIAGNOSIS — I10 Essential (primary) hypertension: Secondary | ICD-10-CM

## 2016-06-14 DIAGNOSIS — Z6839 Body mass index (BMI) 39.0-39.9, adult: Secondary | ICD-10-CM

## 2016-06-14 DIAGNOSIS — M79604 Pain in right leg: Secondary | ICD-10-CM

## 2016-06-14 DIAGNOSIS — M8580 Other specified disorders of bone density and structure, unspecified site: Secondary | ICD-10-CM

## 2016-06-14 DIAGNOSIS — Z800 Family history of malignant neoplasm of digestive organs: Secondary | ICD-10-CM

## 2016-06-14 DIAGNOSIS — E119 Type 2 diabetes mellitus without complications: Secondary | ICD-10-CM

## 2016-06-14 DIAGNOSIS — Z0000 Encounter for general adult medical examination without abnormal findings: Secondary | ICD-10-CM | POA: Diagnosis not present

## 2016-06-14 MED ORDER — NAPROXEN 500 MG PO TABS: 500.0000 mg | ORAL_TABLET | Freq: Two times a day (BID) | ORAL | 5 refills | Status: DC | PRN

## 2016-06-14 MED ORDER — SIMVASTATIN 20 MG PO TABS: ORAL_TABLET | ORAL | 3 refills | Status: DC

## 2016-06-14 MED ORDER — SOLIFENACIN SUCCINATE 10 MG PO TABS: 10.0000 mg | ORAL_TABLET | Freq: Every day | ORAL | 3 refills | Status: DC

## 2016-06-14 MED ORDER — METFORMIN HCL 500 MG PO TABS: 250.0000 mg | ORAL_TABLET | Freq: Two times a day (BID) | ORAL | 3 refills | Status: DC

## 2016-06-14 NOTE — Progress Notes (Signed)
Subjective:    Patient ID: Cindy Whitehead, female    DOB: [birth year], 81 y.o.   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 and 4000 R ear  Hearing does not bother her -she does not want a hearing aide Hx of a fall (she slipped on the ice) - will no longer walk on the ice   Tetanus shot due-she will look into getting one in a pharmacy   colonosc 9/12 nl  She got her 5 y reminder and will schedule that herself   dexa 6/17 at gyn-she is scheduling her appt for yearly D level is low at 23 She takes D   Mammogram 6/17 (gyn -nl per pt)  Self breast exam - no lumps or changes/ gets exam at gyn  bp is stable today  No cp or palpitations or headaches or edema  No side effects to medicines  BP Readings from Last 3 Encounters:  06/14/16 122/78  06/09/16 118/78  11/24/15 128/70     DM2 Lab Results  Component Value Date   HGBA1C 6.1 06/09/2016   Stable  Cannot take ace Lab Results  Component Value Date   MICROALBUR 4.3 (H) 06/09/2016     Hx of hyperlipidemia Lab Results  Component Value Date   CHOL 165 06/09/2016   CHOL 151 11/18/2015   CHOL 165 05/13/2015   Lab Results  Component Value Date   HDL 46.60 06/09/2016   HDL 45.40 11/18/2015   HDL 47.70 05/13/2015   Lab Results  Component Value Date   LDLCALC 87 06/09/2016   LDLCALC 77 11/18/2015   LDLCALC 84 05/13/2015   Lab Results  Component Value Date   TRIG 159.0 (H) 06/09/2016   TRIG 141.0 11/18/2015   TRIG 167.0 (H) 05/13/2015   Lab Results  Component Value Date   CHOLHDL 4  06/09/2016   CHOLHDL 3 11/18/2015   CHOLHDL 3 05/13/2015   Lab Results  Component Value Date   LDLDIRECT 94.0 11/11/2014   LDLDIRECT 125.8 09/23/2008   LDLDIRECT 130.3 05/21/2008   On statin and diet - watches fat Watches some carbs   Review of Systems Review of Systems  Constitutional: Negative for fever, appetite change, fatigue and unexpected weight change.  Eyes: Negative for pain and visual disturbance.  Respiratory: Negative for cough and shortness of breath.   Cardiovascular: Negative for cp or palpitations   occ fleeting pain in chest wall for a fraction of a second not exertional  Gastrointestinal: Negative for nausea, diarrhea and constipation.  Genitourinary: Negative for urgency and frequency.  Skin: Negative for pallor or rash   MSK pos for pain in R low back - stopped going to orthopedic (deg disc dz and arthritis) Neurological: Negative for weakness, light-headedness, numbness and headaches. occ wakes up with the room spinning - lasts a few seconds only then goes away  Hematological: Negative for adenopathy. Does not bruise/bleed easily.  Psychiatric/Behavioral: Negative for dysphoric mood. The patient is not nervous/anxious.  Objective:   Physical Exam  Constitutional: She appears well-developed and well-nourished. No distress.  obese and well appearing   HENT:  Head: Normocephalic and atraumatic.  Right Ear: External ear normal.  Left Ear: External ear normal.  Mouth/Throat: Oropharynx is clear and moist.  Eyes: Conjunctivae and EOM are normal. Pupils are equal, round, and reactive to light. No scleral icterus.  Neck: Normal range of motion. Neck supple. No JVD present. Carotid bruit is not present. No thyromegaly present.  Cardiovascular: Normal rate, regular rhythm, normal heart sounds and intact distal pulses.  Exam reveals no gallop.   Pulmonary/Chest: Effort normal and breath sounds normal. No respiratory distress. She has no wheezes. She  exhibits no tenderness.  Abdominal: Soft. Bowel sounds are normal. She exhibits no distension, no abdominal bruit and no mass. There is no tenderness.  Genitourinary: No breast swelling, tenderness, discharge or bleeding.  Genitourinary Comments: Breast exam: No mass, nodules, thickening, tenderness, bulging, retraction, inflamation, nipple discharge or skin changes noted.  No axillary or clavicular LA.      Musculoskeletal: Normal range of motion. She exhibits no edema or tenderness.  Lymphadenopathy:    She has no cervical adenopathy.  Neurological: She is alert. She has normal reflexes. No cranial nerve deficit. She exhibits normal muscle tone. Coordination normal.  Skin: Skin is warm and dry. No rash noted. No erythema. No pallor.  Lentigines diffusely  Psychiatric: She has a normal mood and affect.          Assessment & Plan:   Problem List Items Addressed This Visit      Cardiovascular and Mediastinum   ESSENTIAL HYPERTENSION, BENIGN - Primary    bp in fair control at this time  BP Readings from Last 1 Encounters:  06/14/16 122/78   No changes needed Disc lifstyle change with low sodium diet and exercise        Relevant Medications   simvastatin (ZOCOR) 20 MG tablet     Endocrine   Diabetes type 2, controlled ([place])    Lab Results  Component Value Date   HGBA1C 6.1 06/09/2016   Stable/well controlled Cannot take ace-rev microalb Enc fluid intake  Eye and foot care discussed      Relevant Medications   metFORMIN (GLUCOPHAGE) 500 MG tablet   simvastatin (ZOCOR) 20 MG tablet     Musculoskeletal and Integument   Osteopenia    dexa 6/17  Gets it with gyn  Low D at 61 Will call back with the amt she takes daily so we can adv        Other   Family history of colon cancer    Due for 5 y colonoscopy recall She has reminder and wants to schedule it herself       Hyperlipidemia    Disc goals for lipids and reasons to control them Rev labs with pt Rev low  sat fat diet in detail  Statin and diet  Enc her to watch carbs      Relevant Medications   simvastatin (ZOCOR) 20 MG tablet   Low back pain radiating to right leg    Deg spine disease  Disc safe use of nsaids and need for walking      Relevant Medications   naproxen (NAPROSYN) 500 MG tablet   Obesity    Discussed how this problem influences overall health and the risks it imposes  Reviewed plan for weight loss with lower calorie diet (via better food choices and also portion control or  program like weight watchers) and exercise building up to or more than 30 minutes 5 days per week including some aerobic activity         Relevant Medications   metFORMIN (GLUCOPHAGE) 500 MG tablet   Routine general medical examination at a health care facility    Reviewed health habits including diet and exercise and skin cancer prevention Reviewed appropriate screening tests for age  Also reviewed health mt list, fam hx and immunization status , as well as social and family history   See HPI AMW reviewed- she declines hearing aides Disc fall prev Labs rev Wt loss enc You are due for a tetanus shot-see the form I gave you regarding getting it in a pharmacy  Don't forget to schedule your colonoscopy and your gyn follow up  Try to get most of your carbs from fruits and vegetables (less bread/crackers/pasta/sweets/rice)  Aim for 64 oz of fluids per day for kidney health  Go home and check your vitamin D bottles- let me know how much you take each day - we will need to increase your D        Vitamin D deficiency    Low level in the 20s Disc imp of D to bone and overall health She is unsure how much she is taking- will call us back with that so we can make rec for inc intake

## 2016-06-14 NOTE — Patient Instructions (Addendum)
You are due for a tetanus shot-see the form I gave you regarding getting it in a pharmacy  Don't forget to schedule your colonoscopy and your gyn follow up  Try to get most of your carbs from fruits and vegetables (less bread/crackers/pasta/sweets/rice)  Aim for 64 oz of fluids per day for kidney health  Go home and check your vitamin D bottles- let me know how much you take each day - we will need to increase your D   Start a walking program-this should also help your back  Every pound off will also help your back !

## 2016-06-14 NOTE — Progress Notes (Signed)
Pre visit review using our clinic review tool, if applicable. No additional management support is needed unless otherwise documented below in the visit note. 

## 2016-06-15 ENCOUNTER — Telehealth: Payer: Self-pay

## 2016-06-15 NOTE — Telephone Encounter (Signed)
Pt left v/m; pt was seen on 06/14/16. Pt is presently taking Vit D3 one pill daily. Per 06/14/16 office note advised may increase Vit D. FYI to Dr Glori Bickers.

## 2016-06-15 NOTE — Telephone Encounter (Signed)
Please ask her how many units (iu) of D is in that D3? Thanks

## 2016-06-15 NOTE — Telephone Encounter (Signed)
Spoke to pt and inquired about VitD ius. Pt states she is not home at the present time, but will contact office back in the am with requested info.

## 2016-06-16 NOTE — Assessment & Plan Note (Signed)
Reviewed health habits including diet and exercise and skin cancer prevention Reviewed appropriate screening tests for age  Also reviewed health mt list, fam hx and immunization status , as well as social and family history   See HPI AMW reviewed- she declines hearing aides Disc fall prev Labs rev Wt loss enc You are due for a tetanus shot-see the form I gave you regarding getting it in a pharmacy  Don't forget to schedule your colonoscopy and your gyn follow up  Try to get most of your carbs from fruits and vegetables (less bread/crackers/pasta/sweets/rice)  Aim for 64 oz of fluids per day for kidney health  Go home and check your vitamin D bottles- let me know how much you take each day - we will need to increase your D

## 2016-06-16 NOTE — Assessment & Plan Note (Signed)
Due for 5 y colonoscopy recall She has reminder and wants to schedule it herself

## 2016-06-16 NOTE — Assessment & Plan Note (Signed)
dexa 6/17  Gets it with gyn  Low D at 23 Will call back with the amt she takes daily so we can adv

## 2016-06-16 NOTE — Assessment & Plan Note (Signed)
Deg spine disease  Disc safe use of nsaids and need for walking

## 2016-06-16 NOTE — Assessment & Plan Note (Signed)
Disc goals for lipids and reasons to control them Rev labs with pt Rev low sat fat diet in detail  Statin and diet  Enc her to watch carbs

## 2016-06-16 NOTE — Assessment & Plan Note (Signed)
bp in fair control at this time  BP Readings from Last 1 Encounters:  06/14/16 122/78   No changes needed Disc lifstyle change with low sodium diet and exercise

## 2016-06-16 NOTE — Assessment & Plan Note (Signed)
Low level in the 20s Disc imp of D to bone and overall health She is unsure how much she is taking- will call us back with that so we can make rec for inc intake

## 2016-06-16 NOTE — Assessment & Plan Note (Signed)
Discussed how this problem influences overall health and the risks it imposes  Reviewed plan for weight loss with lower calorie diet (via better food choices and also portion control or program like weight watchers) and exercise building up to or more than 30 minutes 5 days per week including some aerobic activity    

## 2016-06-16 NOTE — Assessment & Plan Note (Signed)
Lab Results  Component Value Date   HGBA1C 6.1 06/09/2016   Stable/well controlled Cannot take ace-rev microalb Enc fluid intake  Eye and foot care discussed

## 2016-06-24 ENCOUNTER — Other Ambulatory Visit: Payer: Self-pay | Admitting: Family Medicine

## 2016-06-27 DIAGNOSIS — H40013 Open angle with borderline findings, low risk, bilateral: Secondary | ICD-10-CM | POA: Diagnosis not present

## 2016-06-27 DIAGNOSIS — E119 Type 2 diabetes mellitus without complications: Secondary | ICD-10-CM | POA: Diagnosis not present

## 2016-06-27 LAB — HM DIABETES EYE EXAM

## 2016-07-08 ENCOUNTER — Encounter: Payer: Self-pay | Admitting: Family Medicine

## 2016-09-15 ENCOUNTER — Encounter: Payer: Self-pay | Admitting: Internal Medicine

## 2016-09-15 ENCOUNTER — Ambulatory Visit (INDEPENDENT_AMBULATORY_CARE_PROVIDER_SITE_OTHER): Payer: Medicare Other | Admitting: Internal Medicine

## 2016-09-15 VITALS — BP 122/78 | HR 65 | Temp 97.8°F | Wt 181.2 lb

## 2016-09-15 DIAGNOSIS — H9313 Tinnitus, bilateral: Secondary | ICD-10-CM | POA: Diagnosis not present
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
## 2016-09-15 NOTE — Patient Instructions (Signed)
Tinnitus Tinnitus refers to hearing a sound when there is no actual source for that sound. This is often described as ringing in the ears. However, people with this condition may hear a variety of noises. A person may hear the sound in one ear or in both ears. The sounds of tinnitus can be soft, loud, or somewhere in between. Tinnitus can last for a few seconds or can be constant for days. It may go away without treatment and come back at various times. When tinnitus is constant or happens often, it can lead to other problems, such as trouble sleeping and trouble concentrating. Almost everyone experiences tinnitus at some point. Tinnitus that is long-lasting (chronic) or comes back often is a problem that may require medical attention. What are the causes? The cause of tinnitus is often not known. In some cases, it can result from other problems or conditions, including:  Exposure to loud noises from machinery, music, or other sources.  Hearing loss.  Ear or sinus infections.  Earwax buildup.  A foreign object in the ear.  Use of certain medicines.  Use of alcohol and caffeine.  High blood pressure.  Heart diseases.  Anemia.  Allergies.  Meniere disease.  Thyroid problems.  Tumors.  An enlarged part of a weakened blood vessel (aneurysm).  What are the signs or symptoms? The main symptom of tinnitus is hearing a sound when there is no source for that sound. It may sound like:  Buzzing.  Roaring.  Ringing.  Blowing air, similar to the sound heard when you listen to a seashell.  Hissing.  Whistling.  Sizzling.  Humming.  Running water.  A sustained musical note.  How is this diagnosed? Tinnitus is diagnosed based on your symptoms. Your health care provider will do a physical exam. A comprehensive hearing exam (audiologic exam) will be done if your tinnitus:  Affects only one ear (unilateral).  Causes hearing difficulties.  Lasts 6 months or  longer.  You may also need to see a health care provider who specializes in hearing disorders (audiologist). You may be asked to complete a questionnaire to determine the severity of your tinnitus. Tests may be done to help determine the cause and to rule out other conditions. These can include:  Imaging studies of your head and brain, such as: ? A CT scan. ? An MRI.  An imaging study of your blood vessels (angiogram).  How is this treated? Treating an underlying medical condition can sometimes make tinnitus go away. If your tinnitus continues, other treatments may include:  Medicines, such as certain antidepressants or sleeping aids.  Sound generators to mask the tinnitus. These include: ? Tabletop sound machines that play relaxing sounds to help you fall asleep. ? Wearable devices that fit in your ear and play sounds or music. ? A small device that uses headphones to deliver a signal embedded in music (acoustic neural stimulation). In time, this may change the pathways of your brain and make you less sensitive to tinnitus. This device is used for very severe cases when no other treatment is working.  Therapy and counseling to help you manage the stress of living with tinnitus.  Using hearing aids or cochlear implants, if your tinnitus is related to hearing loss.  Follow these instructions at home:  When possible, avoid being in loud places and being exposed to loud sounds.  Wear hearing protection, such as earplugs, when you are exposed to loud noises.  Do not take stimulants, such as nicotine,   alcohol, or caffeine.  Practice techniques for reducing stress, such as meditation, yoga, or deep breathing.  Use a white noise machine, a humidifier, or other devices to mask the sound of tinnitus.  Sleep with your head slightly raised. This may reduce the impact of tinnitus.  Try to get plenty of rest each night. Contact a health care provider if:  You have tinnitus in just one  ear.  Your tinnitus continues for 3 weeks or longer without stopping.  Home care measures are not helping.  You have tinnitus after a head injury.  You have tinnitus along with any of the following: ? Dizziness. ? Loss of balance. ? Nausea and vomiting. This information is not intended to replace advice given to you by your health care provider. Make sure you discuss any questions you have with your health care provider. Document Released: 02/21/2005 Document Revised: 10/25/2015 Document Reviewed: 07/24/2013 Elsevier Interactive Patient Education  2018 [employer]  

## 2016-10-17 ENCOUNTER — Ambulatory Visit (INDEPENDENT_AMBULATORY_CARE_PROVIDER_SITE_OTHER): Payer: Medicare Other | Admitting: Medical

## 2016-10-17 ENCOUNTER — Telehealth: Payer: Self-pay | Admitting: Family Medicine

## 2016-10-17 VITALS — BP 122/70 | HR 78 | Temp 97.9°F | Resp 14 | Ht <= 58 in | Wt 180.0 lb

## 2016-10-17 DIAGNOSIS — Z23 Encounter for immunization: Secondary | ICD-10-CM | POA: Diagnosis not present

## 2016-10-17 DIAGNOSIS — W548XXA Other contact with dog, initial encounter: Secondary | ICD-10-CM

## 2016-10-17 DIAGNOSIS — T148XXA Other injury of unspecified body region, initial encounter: Secondary | ICD-10-CM

## 2016-10-17 MED ORDER — CEPHALEXIN 500 MG PO CAPS: 500.0000 mg | ORAL_CAPSULE | Freq: Three times a day (TID) | ORAL | 0 refills | Status: DC

## 2016-10-17 NOTE — Telephone Encounter (Signed)
I will watch for ED notes
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
## 2016-10-17 NOTE — Telephone Encounter (Signed)
Nurse Assessment Nurse: Vallery Sa, RN, [name] Date/Time (Eastern Time): 10/17/2016 10:16:44 AM Confirm and document reason for call. If symptomatic, describe symptoms. ---Cindy Whitehead states a big dog scratched her left forearm 3 days ago that is very red, inflamed today. No fever. The area is starting to tingling. She can feel her fingers. Alert and responsive. Does the patient have any new or worsening symptoms? ---Yes Will a triage be completed? ---Yes Related visit to physician within the last 2 weeks? ---No Does the PT have any chronic conditions? (i.e. diabetes, asthma, etc.) ---Yes List chronic conditions. ---Diabetes Is this a behavioral health or substance abuse call? ---No Guidelines Guideline Title Affirmed Question Affirmed Notes Animal Bite [1] Cut (length > 1/8 inch or 3 mm) or skin tear AND [2] any animal Final Disposition User Go to ED Now Vallery Sa, RN, [facility] - ED Disagree/Comply: Comply

## 2016-10-24 ENCOUNTER — Ambulatory Visit (INDEPENDENT_AMBULATORY_CARE_PROVIDER_SITE_OTHER): Payer: Medicare Other | Admitting: Family Medicine

## 2016-10-24 ENCOUNTER — Encounter: Payer: Self-pay | Admitting: Family Medicine

## 2016-10-24 VITALS — BP 130/80 | HR 61 | Temp 97.9°F | Wt 182.2 lb

## 2016-10-24 DIAGNOSIS — S51812A Laceration without foreign body of left forearm, initial encounter: Secondary | ICD-10-CM

## 2016-10-24 NOTE — Progress Notes (Signed)
   Subjective:    Patient ID: Cindy Whitehead, female    DOB: 04/20/1935, 81 y.o.   MRN: [id number]  HPI Chief Complaint  Patient presents with  . arm injury    arm injury and split skin   She is here with complaints of a skin tear to her left forearm that occurred while walking her daughter's dog 5 days ago. States her arm hit a door. She cleaned the area with soap and water and has been applying Neosporin. States the area is healing and looks much better than last week.  She was seen last week for a dog scratch to the same arm and that area seems to be doing well.  She does have underlying diabetes.   Tetanus is up to date.   Denies fever, chills, nausea, vomiting.   Reviewed allergies, medications, past medical, surgical, and social history.   Review of Systems Pertinent positives and negatives in the history of present illness.     Objective:   Physical Exam  Constitutional: She appears well-developed and well-nourished. No distress.  Musculoskeletal:       Left elbow: Normal.       Left wrist: Normal.       Arms:      Left hand: Normal.  Skin tear to her left posterior forearm with bruising surrounding the area. No edema, drainage or sign of infection.  Left upper extremity is neurovascularly intact.      BP 130/80   Pulse 61   Temp 97.9 F (36.6 C) (Oral)   Wt 182 lb 3.2 oz (82.6 kg)   BMI 38.08 kg/m       Assessment & Plan:  Skin tear of left forearm without complication, initial encounter  Discussed that there is no evidence of infection and recommend that she complete the oral antibiotic that she started last week for a dog scratch. Applied topical bacitracin and non adhesive dressing to her left forearm. Strict return precautions given for any new or worsening symptoms. Tetanus is up to date.

## 2016-10-24 NOTE — Patient Instructions (Addendum)
As discussed, complete the oral antibiotic and use the bacitracin.  keep the area covered. If you notice any worsening or new symptoms then let me know.

## 2016-11-15 ENCOUNTER — Emergency Department (HOSPITAL_COMMUNITY)
Admission: EM | Admit: 2016-11-15 | Discharge: 2016-11-15 | Disposition: A | Discharge status: Home / Self Care | Payer: Medicare Other | Attending: Emergency Medicine | Admitting: Emergency Medicine

## 2016-11-15 ENCOUNTER — Emergency Department (HOSPITAL_COMMUNITY): Payer: Medicare Other

## 2016-11-15 ENCOUNTER — Encounter (HOSPITAL_COMMUNITY): Payer: Self-pay

## 2016-11-15 DIAGNOSIS — R200 Anesthesia of skin: Secondary | ICD-10-CM | POA: Diagnosis not present

## 2016-11-15 DIAGNOSIS — Z8639 Personal history of other endocrine, nutritional and metabolic disease: Secondary | ICD-10-CM | POA: Insufficient documentation

## 2016-11-15 DIAGNOSIS — E119 Type 2 diabetes mellitus without complications: Secondary | ICD-10-CM | POA: Insufficient documentation

## 2016-11-15 DIAGNOSIS — I10 Essential (primary) hypertension: Secondary | ICD-10-CM | POA: Diagnosis not present

## 2016-11-15 DIAGNOSIS — R29818 Other symptoms and signs involving the nervous system: Secondary | ICD-10-CM | POA: Diagnosis not present

## 2016-11-15 DIAGNOSIS — Z7984 Long term (current) use of oral hypoglycemic drugs: Secondary | ICD-10-CM | POA: Insufficient documentation

## 2016-11-15 DIAGNOSIS — G454 Transient global amnesia: Secondary | ICD-10-CM | POA: Diagnosis not present

## 2016-11-15 DIAGNOSIS — Z79899 Other long term (current) drug therapy: Secondary | ICD-10-CM | POA: Insufficient documentation

## 2016-11-15 DIAGNOSIS — R413 Other amnesia: Secondary | ICD-10-CM | POA: Diagnosis not present

## 2016-11-15 DIAGNOSIS — J45909 Unspecified asthma, uncomplicated: Secondary | ICD-10-CM | POA: Insufficient documentation

## 2016-11-15 DIAGNOSIS — R4182 Altered mental status, unspecified: Secondary | ICD-10-CM | POA: Diagnosis present

## 2016-11-15 LAB — DIFFERENTIAL
BASOS ABS: 0.1 10*3/uL (ref 0.0–0.1)
Basophils Relative: 1 %
EOS PCT: 3 %
Eosinophils Absolute: 0.2 10*3/uL (ref 0.0–0.7)
LYMPHS PCT: 28 %
Lymphs Abs: 1.7 10*3/uL (ref 0.7–4.0)
Monocytes Absolute: 0.3 10*3/uL (ref 0.1–1.0)
Monocytes Relative: 5 %
NEUTROS ABS: 3.9 10*3/uL (ref 1.7–7.7)
NEUTROS PCT: 63 %

## 2016-11-15 LAB — CBC
HCT: 38.9 % (ref 36.0–46.0)
Hemoglobin: 13.1 g/dL (ref 12.0–15.0)
MCH: 31.9 pg (ref 26.0–34.0)
MCHC: 33.7 g/dL (ref 30.0–36.0)
MCV: 94.6 fL (ref 78.0–100.0)
PLATELETS: 155 10*3/uL (ref 150–400)
RBC: 4.11 MIL/uL (ref 3.87–5.11)
RDW: 12.8 % (ref 11.5–15.5)
WBC: 6.1 10*3/uL (ref 4.0–10.5)

## 2016-11-15 LAB — COMPREHENSIVE METABOLIC PANEL
ALBUMIN: 3.8 g/dL (ref 3.5–5.0)
ALK PHOS: 65 U/L (ref 38–126)
ALT: 23 U/L (ref 14–54)
ANION GAP: 10 (ref 5–15)
AST: 32 U/L (ref 15–41)
BUN: 13 mg/dL (ref 6–20)
CALCIUM: 9.5 mg/dL (ref 8.9–10.3)
CO2: 22 mmol/L (ref 22–32)
Chloride: 108 mmol/L (ref 101–111)
Creatinine, Ser: 0.89 mg/dL (ref 0.44–1.00)
GFR calc Af Amer: >60 mL/min (ref >60)
GFR calc non Af Amer: >60 mL/min (ref >60)
GLUCOSE: 129 mg/dL — AB (ref 65–99)
Potassium: 3.6 mmol/L (ref 3.5–5.1)
SODIUM: 140 mmol/L (ref 135–145)
Total Bilirubin: 0.9 mg/dL (ref 0.3–1.2)
Total Protein: 6.8 g/dL (ref 6.5–8.1)

## 2016-11-15 LAB — I-STAT CHEM 8, ED
BUN: 15 mg/dL (ref 6–20)
BUN: 16 mg/dL (ref 6–20)
CHLORIDE: 107 mmol/L (ref 101–111)
CHLORIDE: 107 mmol/L (ref 101–111)
CREATININE: 0.8 mg/dL (ref 0.44–1.00)
Calcium, Ion: 1.14 mmol/L — ABNORMAL LOW (ref 1.15–1.40)
Calcium, Ion: 1.16 mmol/L (ref 1.15–1.40)
Creatinine, Ser: 0.7 mg/dL (ref 0.44–1.00)
GLUCOSE: 134 mg/dL — AB (ref 65–99)
Glucose, Bld: 148 mg/dL — ABNORMAL HIGH (ref 65–99)
HCT: 39 % (ref 36.0–46.0)
HCT: 40 % (ref 36.0–46.0)
Hemoglobin: 13.3 g/dL (ref 12.0–15.0)
Hemoglobin: 13.6 g/dL (ref 12.0–15.0)
POTASSIUM: 3.7 mmol/L (ref 3.5–5.1)
Potassium: 3.8 mmol/L (ref 3.5–5.1)
SODIUM: 143 mmol/L (ref 135–145)
Sodium: 143 mmol/L (ref 135–145)
TCO2: 23 mmol/L (ref 22–32)
TCO2: 25 mmol/L (ref 22–32)

## 2016-11-15 LAB — PROTIME-INR
INR: 1.04
Prothrombin Time: 13.5 seconds (ref 11.4–15.2)

## 2016-11-15 LAB — CBG MONITORING, ED
GLUCOSE-CAPILLARY: 127 mg/dL — AB (ref 65–99)
GLUCOSE-CAPILLARY: 146 mg/dL — AB (ref 65–99)

## 2016-11-15 LAB — I-STAT TROPONIN, ED
TROPONIN I, POC: 0.06 ng/mL (ref 0.00–0.08)
Troponin i, poc: 0.04 ng/mL (ref 0.00–0.08)

## 2016-11-15 LAB — ETHANOL: Alcohol, Ethyl (B): <5 mg/dL (ref <5)

## 2016-11-15 LAB — APTT: aPTT: 30 seconds (ref 24–36)

## 2016-11-15 NOTE — ED Triage Notes (Signed)
Pt arrives to ED with family pov with acute onset of confusion that began at 1400 while cleaning out garage. Family reports pt suddenly couldn't remember what was going on, what had happened that morning, or who anyone was. At this time pt is tearful to simple questions, recognizes family , A&O x 3. Disoriented to situation. PT repeats "why am I here?" and "I need a bath" in triage. Dr. Alvino Chapel in triage to see pt at this time.  No unilateral weakness, visual changes, slurred speech. Not a code stroke at this time. Pt family states she reported headache on the way to ED

## 2016-11-15 NOTE — Code Documentation (Signed)
81yo female arriving to [facility] via private vehicle at [phone].  Patient from home where she was cleaning out a storage facility with her husband and became suddenly confused at 44.  Patient presented to the ED where a code stroke was called.  Patient to CT.  Stroke team to the bedside.  CT completed.  NIHSS 3, see documentation for details and code stroke times.  Patient unable to recall events from today and perseverating on her dog.  No focal deficits on exam.  Code stroke canceled.  No acute stroke treatment at this time.  Bedside handoff with ED RN Annamary Carolin.

## 2016-11-15 NOTE — ED Notes (Signed)
Pt ambulatory to restroom with steady gait; independent toileting

## 2016-11-15 NOTE — Consult Note (Signed)
NEURO HOSPITALIST CONSULT NOTE   Requestig physician: Dr. Alvino Chapel  Reason for Consult: Acute loss of short term memory with confusion  History obtained from:  Family  HPI:                                                                                                                                          Cindy Whitehead is an 81 y.o. female who presented to the ED with family for assessment of acute onset of disorientation. She was in her [facility] until 1533 this afternoon, when she suddenly became confused and kept asking the same questions over and over, despite replies by family reorienting her to the situation. She was cleaning out a storage unit with her husband at the time of symptom onset. Questions primarily revolved around why she was at the facility, what items they were moving, what had happened that morning. During the drive to the ED, she reported a headache. Questions continued after arrival to the ED; the patient was observed to be tearful and repeated "why am I here?" and "I need a bath" in triage. There was no unilateral weakness, visual changes or slurred speech.    Family states that her memory is currently improving. Denies motor weakness, imbalance, incoordination, chest pain, fever or chills. She has been experiencing some psychosocial stressors pertaining to the move of a family member recently.   Past Medical History:  Diagnosis Date  . Allergic rhinitis   . Asthma   . Bone loss    ? osteopenia   . Diabetes mellitus   . Hyperlipidemia   . Kidney stones   . Obesity   . Pes planus   . Stress    cares for mohter who is verbally abusive     Past Surgical History:  Procedure Laterality Date  . KNEE SURGERY      Family History  Problem Relation Age of Onset  . Colon cancer Mother    Social History:  reports that she has never smoked. She has never used smokeless tobacco. She reports that she does not drink alcohol or use drugs.  Allergies    Allergen Reactions  . Atorvastatin Other (See Comments)    REACTION: leg pain  . Ace Inhibitors Other (See Comments)    cough    HOME MEDICATIONS:  ROS:                                                                                                                                       No flushing, cyanosis, SOB or symptoms of over-exertion at time of symptom onset. Other ROS as per HPI.   Blood pressure 140/72, pulse (!) 101, temperature 97.8 F (36.6 C), temperature source Oral, resp. rate 18, weight 82.6 kg (182 lb), SpO2 97 %.  General Examination:                                                                                                      HEENT-  [place]/AT    Lungs- Respirations unlabored Extremities- Warm and well perfused  Neurological Examination Mental Status: Alert. Pleasant and cooperative. No dysarthria. Oriented to self, hospital and month, but not day of week. States it is "2017". Speech fluent with intact naming and repetition. Able to follow all basic commands but has difficulty with directional 2 and 3-step commands. Unable to recall events prior to her arrival to the ED, spanning several days. Long term memory intact.  Cranial Nerves: II: Visual fields grossly intact. PERRL.   III,IV, VI: Ptosis not present. In the context of chronic esotropia (due to left eye weakness per family), the patient's EOM are intact. No nystagmus.  V,VII: Smile symmetric. Decreased temp sensation left face.  VIII: hearing intact to questions and commands IX,X: No hypophonia XI: Symmetric XII: midline tongue extension Motor: Right : Upper extremity   5/5    Left:     Upper extremity   5/5  Lower extremity   5/5     Lower extremity   5/5 Normal tone throughout; no atrophy noted Sensory: Light touch intact throughout; no extinction. Decreased temp sensation LUE. Normal  temp sensation LLE.  Deep Tendon Reflexes: 1-2+ and symmetric throughout Plantars: Right: downgoing  Left: downgoing Cerebellar: No ataxia with FNF bilaterally Gait: Deferred   Lab Results: Basic Metabolic Panel:  Recent Labs Lab 11/15/16 1603 11/15/16 1614  NA 143 143  K 3.8 3.7  CL 107 107  GLUCOSE 148* 134*  BUN 16 15  CREATININE 0.80 0.70    Liver Function Tests: No results for input(s): AST, ALT, ALKPHOS, BILITOT, PROT, ALBUMIN in the last 168 hours. No results for input(s): LIPASE, AMYLASE in the last 168 hours. No results for input(s): AMMONIA in the last 168 hours.  CBC:  Recent Labs Lab 11/15/16 1601 11/15/16 1603 11/15/16 1614  WBC 6.1  --   --   NEUTROABS 3.9  --   --   HGB 13.1 13.6 13.3  HCT 38.9 40.0 39.0  MCV 94.6  --   --   PLT 155  --   --     Cardiac Enzymes: No results for input(s): CKTOTAL, CKMB, CKMBINDEX, TROPONINI in the last 168 hours.  Lipid Panel: No results for input(s): CHOL, TRIG, HDL, CHOLHDL, VLDL, LDLCALC in the last 168 hours.  CBG:  Recent Labs Lab 11/15/16 1538 11/15/16 1606  GLUCAP 146* 127*    Microbiology: Results for orders placed or performed during the hospital encounter of 04/26/14  Urine culture     Status: None   Collection Time: 04/26/14  6:27 PM  Result Value Ref Range Status   Specimen Description URINE, CLEAN CATCH  Final   Special Requests NONE  Final   Colony Count   Final    10,000 COLONIES/ML Performed at [facility]    Culture   Final    Multiple bacterial morphotypes present, none predominant. Suggest appropriate recollection if clinically indicated. Performed at [facility]    Report Status 04/28/2014 FINAL  Final    Coagulation Studies:  Recent Labs  11/15/16 1601  LABPROT 13.5  INR 1.04    Imaging: Ct Head Code Stroke Wo Contrast  Result Date: 11/15/2016 CLINICAL DATA:  Confusion. Initial code stroke designation was canceled. EXAM: CT HEAD CODE STROKE W/O  CM TECHNIQUE: Contiguous axial images were obtained from the base of the skull through the vertex without intravenous contrast. COMPARISON:  None. FINDINGS: Brain: There is no evidence of acute infarct, intracranial hemorrhage, mass, midline shift, or extra-axial fluid collection. Mild generalized cerebral atrophy is within normal limits for age. Vascular: Calcified atherosclerosis at the skullbase. No hyperdense vessel. Skull: No fracture or focal osseous lesion. Sinuses/Orbits: Visualized paranasal sinuses and mastoid air cells are clear. Bilateral cataract extraction. Other: None. IMPRESSION: No evidence of acute intracranial abnormality. Unremarkable CT appearance of the brain for age. Electronically Signed   By: Logan Bores M.D.   On: 11/15/2016 16:25   Assessment: 81 year old female presenting with symptoms most consistent with transient global amnesia 1. CT head is normal for age. 2. Examination reveals antegrade amnesia, which is gradually improving.  3. Also noted on exam is decreased temperature sensation of left face and arm. No extinction noted. DDx includes right thalamic lacunar infarction of indeterminate age.   Recommendations: 1. MRI brain. If negative can discharge home. 2. Discussed with patient and family.   Electronically signed: Dr. Kerney Elbe 11/15/2016, 4:39 PM

## 2016-11-15 NOTE — ED Provider Notes (Signed)
[facility] DEPT Provider Note   CSN: [id number] Arrival date & time: 11/15/16  1533     History   Chief Complaint Chief Complaint  Patient presents with  . Stroke Symptoms  level 5 caveat due to altered mental status. HPI Cindy Whitehead is a 81 y.o. female.  HPI Patient brought in for confusion. Began at 2:00 while she was working cleaning out the garage. Patient suddenly was confused and couldn't remember what was going on or what happened at that time her little earlier. Patient now just says she needs a bath. Somewhat confused about why she is in the ER. She is alert to herself and place however. Slight dull headache. Has not an episode like this before. Reportedly had missed taking a diabetes medicine earlier today but has since taken it. Has not had an episode like this before. CBG was okay. Past Medical History:  Diagnosis Date  . Allergic rhinitis   . Asthma   . Bone loss    ? osteopenia   . Diabetes mellitus   . Hyperlipidemia   . Kidney stones   . Obesity   . Pes planus   . Stress    cares for mohter who is verbally abusive     Patient Active Problem List   Diagnosis Date Noted  . Dog scratch 10/17/2016  . Abrasion of skin 10/17/2016  . Urge incontinence 11/24/2015  . Routine general medical examination at a health care facility 05/20/2015  . Family history of colon cancer 05/20/2015  . Chronic back pain 05/20/2015  . Low back pain radiating to right leg 09/02/2014  . Tendonitis of finger 06/03/2014  . Encounter for Medicare annual wellness exam 05/13/2014  . Adjustment disorder with mixed anxiety and depressed mood 10/29/2013  . GERD (gastroesophageal reflux disease) 10/13/2010  . Vitamin D deficiency 04/14/2010  . Osteopenia 05/26/2008  . ESSENTIAL HYPERTENSION, BENIGN 05/21/2008  . Obesity 08/22/2007  . REACTION, ACUTE STRESS W/EMOTIONAL DSTURB 09/14/2006  . Diabetes type 2, controlled ([place]) 05/11/2006  . Hyperlipidemia 05/11/2006  . ALLERGIC  RHINITIS 05/11/2006  . ASTHMA 05/11/2006    Past Surgical History:  Procedure Laterality Date  . KNEE SURGERY      OB History    No data available       Home Medications    Prior to Admission medications   Medication Sig Start Date End Date Taking? Authorizing Provider  albuterol (PROVENTIL HFA;VENTOLIN HFA) 108 (90 BASE) MCG/ACT inhaler Inhale 2 puffs into the lungs every 6 (six) hours as needed for wheezing or shortness of breath. 03/14/14  Yes Tower, Wynelle Fanny, MD  metFORMIN (GLUCOPHAGE) 500 MG tablet Take 0.5 tablets (250 mg total) by mouth 2 (two) times daily. 06/14/16  Yes Tower, Wynelle Fanny, MD  Multiple Vitamin (MULTIVITAMIN WITH MINERALS) TABS tablet Take 1 tablet by mouth daily.   Yes [provider]  naproxen (NAPROSYN) 500 MG tablet Take 1 tablet (500 mg total) by mouth 2 (two) times daily as needed. With a meal 06/14/16  Yes Tower, Roque Lias A, MD  simvastatin (ZOCOR) 20 MG tablet TAKE 1/2 TABLET BY MOUTH EVERY EVENING WITH A LOW FAT SNACK Patient taking differently: Take 10 mg by mouth every evening. WITH A LOW FAT SNACK 06/14/16  Yes Tower, Marne A, MD  solifenacin (VESICARE) 10 MG tablet Take 1 tablet (10 mg total) by mouth daily. 06/14/16  Yes Tower, Wynelle Fanny, MD  cephALEXin (KEFLEX) 500 MG capsule Take 1 capsule (500 mg total) by mouth 3 (three)  times daily. Patient not taking: Reported on 11/15/2016 10/17/16   Tysinger, Camelia Eng, PA-C  glucose blood test strip Check blood sugar 3 times daily. 01/23/13   Jearld Fenton, NP    Family History Family History  Problem Relation Age of Onset  . Colon cancer Mother     Social History Social History  Substance Use Topics  . Smoking status: Never Smoker  . Smokeless tobacco: Never Used  . Alcohol use No     Allergies   Atorvastatin and Ace inhibitors   Review of Systems Review of Systems  Unable to perform ROS: Mental status change  Constitutional: Negative for appetite change and fever.  Psychiatric/Behavioral:  The patient is nervous/anxious.      Physical Exam Updated Vital Signs BP (!) 119/93   Pulse 65   Temp 98.1 F (36.7 C)   Resp (!) 24   Wt 82.6 kg (182 lb)   SpO2 98%   BMI 38.04 kg/m   Physical Exam  Constitutional: She appears well-developed and well-nourished.  HENT:  Head: Atraumatic.  Eyes: EOM are normal.  Neck: Neck supple.  Cardiovascular: Normal rate.   Pulmonary/Chest: Effort normal.  Neurological: She is alert. No cranial nerve deficit.  Face symmetric. Nose her name. Moving all extremity. Some difficulty remembering where she was born. Cannot remember what happened earlier today. Has had a little difficulty recognizing her daughter also. She is however able to remember that her daughter drove her to the hospital. States that she really needs a bath.  Skin: Skin is warm. Capillary refill takes less than 2 seconds.     ED Treatments / Results  Labs (all labs ordered are listed, but only abnormal results are displayed) Labs Reviewed  COMPREHENSIVE METABOLIC PANEL - Abnormal; Notable for the following:       Result Value   Glucose, Bld 129 (*)    All other components within normal limits  CBG MONITORING, ED - Abnormal; Notable for the following:    Glucose-Capillary 146 (*)    All other components within normal limits  I-STAT CHEM 8, ED - Abnormal; Notable for the following:    Glucose, Bld 148 (*)    All other components within normal limits  CBG MONITORING, ED - Abnormal; Notable for the following:    Glucose-Capillary 127 (*)    All other components within normal limits  I-STAT CHEM 8, ED - Abnormal; Notable for the following:    Glucose, Bld 134 (*)    Calcium, Ion 1.14 (*)    All other components within normal limits  ETHANOL  PROTIME-INR  APTT  CBC  DIFFERENTIAL  I-STAT TROPONIN, ED  I-STAT TROPONIN, ED    EKG  EKG Interpretation None       Radiology Mr Brain Wo Contrast  Result Date: 11/15/2016 CLINICAL DATA:  Memory loss.  Acute  confusion EXAM: MRI HEAD WITHOUT CONTRAST TECHNIQUE: Multiplanar, multiecho pulse sequences of the brain and surrounding structures were obtained without intravenous contrast. COMPARISON:  CT head 11/15/2016 FINDINGS: Brain: Mild atrophy without hydrocephalus. Negative for acute infarct. Negative for hemorrhage or mass. Minimal chronic changes in the white matter. Negative for hemorrhage or mass Vascular: Normal arterial flow voids Skull and upper cervical spine: Negative Sinuses/Orbits: Negative Other: None IMPRESSION: Mild atrophy without acute abnormality. Electronically Signed   By: Franchot Gallo M.D.   On: 11/15/2016 18:20   Ct Head Code Stroke Wo Contrast  Result Date: 11/15/2016 CLINICAL DATA:  Confusion. Initial code stroke designation was  canceled. EXAM: CT HEAD CODE STROKE W/O CM TECHNIQUE: Contiguous axial images were obtained from the base of the skull through the vertex without intravenous contrast. COMPARISON:  None. FINDINGS: Brain: There is no evidence of acute infarct, intracranial hemorrhage, mass, midline shift, or extra-axial fluid collection. Mild generalized cerebral atrophy is within normal limits for age. Vascular: Calcified atherosclerosis at the skullbase. No hyperdense vessel. Skull: No fracture or focal osseous lesion. Sinuses/Orbits: Visualized paranasal sinuses and mastoid air cells are clear. Bilateral cataract extraction. Other: None. IMPRESSION: No evidence of acute intracranial abnormality. Unremarkable CT appearance of the brain for age. Electronically Signed   By: Logan Bores M.D.   On: 11/15/2016 16:25    Procedures Procedures (including critical care time)  Medications Ordered in ED Medications - No data to display   Initial Impression / Assessment and Plan / ED Course  I have reviewed the triage vital signs and the nursing notes.  Pertinent labs & imaging results that were available during my care of the patient were reviewed by me and considered in my  medical decision making (see chart for details).     Patient with memory loss.Likely transient global amnesia. Initially called a code stroke since it was acute onset but canceled after discussion with neurology that it was likely transient global amnesia. CT and MRI done reassuring. Mental status improved back to baseline. Discharge home.  Final Clinical Impressions(s) / ED Diagnoses   Final diagnoses:  Transient global amnesia    New Prescriptions Discharge Medication List as of 11/15/2016  8:49 PM       Davonna Belling, MD 11/16/16 [phone]

## 2016-12-26 ENCOUNTER — Encounter: Payer: Self-pay | Admitting: Family Medicine

## 2016-12-26 ENCOUNTER — Ambulatory Visit (INDEPENDENT_AMBULATORY_CARE_PROVIDER_SITE_OTHER): Payer: Medicare Other | Admitting: Family Medicine

## 2016-12-26 VITALS — BP 128/68 | HR 84 | Temp 98.1°F | Ht <= 58 in | Wt 183.5 lb

## 2016-12-26 DIAGNOSIS — H9319 Tinnitus, unspecified ear: Secondary | ICD-10-CM | POA: Insufficient documentation

## 2016-12-26 DIAGNOSIS — H9313 Tinnitus, bilateral: Secondary | ICD-10-CM

## 2016-12-26 DIAGNOSIS — Z23 Encounter for immunization: Secondary | ICD-10-CM | POA: Diagnosis not present

## 2016-12-26 NOTE — Patient Instructions (Signed)
Take care of your hearing  Always use ear protection when around loud noise (like shooting) We will refer you to audiology (hearing specialist)
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
## 2017-01-04 DIAGNOSIS — E119 Type 2 diabetes mellitus without complications: Secondary | ICD-10-CM | POA: Diagnosis not present

## 2017-01-04 DIAGNOSIS — H40013 Open angle with borderline findings, low risk, bilateral: Secondary | ICD-10-CM | POA: Diagnosis not present

## 2017-01-11 DIAGNOSIS — Z1231 Encounter for screening mammogram for malignant neoplasm of breast: Secondary | ICD-10-CM | POA: Diagnosis not present

## 2017-01-11 DIAGNOSIS — Z01419 Encounter for gynecological examination (general) (routine) without abnormal findings: Secondary | ICD-10-CM | POA: Diagnosis not present

## 2017-01-16 DIAGNOSIS — H903 Sensorineural hearing loss, bilateral: Secondary | ICD-10-CM | POA: Diagnosis not present

## 2017-06-12 ENCOUNTER — Ambulatory Visit (INDEPENDENT_AMBULATORY_CARE_PROVIDER_SITE_OTHER): Payer: Medicare Other

## 2017-06-12 ENCOUNTER — Telehealth: Payer: Self-pay | Admitting: Family Medicine

## 2017-06-12 ENCOUNTER — Ambulatory Visit: Payer: Medicare Other

## 2017-06-12 VITALS — BP 116/80 | HR 75 | Temp 97.9°F | Ht <= 58 in | Wt 184.8 lb

## 2017-06-12 DIAGNOSIS — E7800 Pure hypercholesterolemia, unspecified: Secondary | ICD-10-CM | POA: Diagnosis not present

## 2017-06-12 DIAGNOSIS — E119 Type 2 diabetes mellitus without complications: Secondary | ICD-10-CM

## 2017-06-12 DIAGNOSIS — E559 Vitamin D deficiency, unspecified: Secondary | ICD-10-CM

## 2017-06-12 DIAGNOSIS — I10 Essential (primary) hypertension: Secondary | ICD-10-CM | POA: Diagnosis not present

## 2017-06-12 DIAGNOSIS — Z0000 Encounter for general adult medical examination without abnormal findings: Secondary | ICD-10-CM | POA: Diagnosis not present

## 2017-06-12 LAB — MICROALBUMIN / CREATININE URINE RATIO
Creatinine,U: 102.2 mg/dL
Microalb Creat Ratio: 1.6 mg/g (ref 0.0–30.0)
Microalb, Ur: 1.6 mg/dL (ref 0.0–1.9)

## 2017-06-12 LAB — CBC WITH DIFFERENTIAL/PLATELET
Basophils Absolute: 0.1 10*3/uL (ref 0.0–0.1)
Basophils Relative: 1.2 % (ref 0.0–3.0)
EOS PCT: 3.5 % (ref 0.0–5.0)
Eosinophils Absolute: 0.2 10*3/uL (ref 0.0–0.7)
HEMATOCRIT: 41 % (ref 36.0–46.0)
Hemoglobin: 14.1 g/dL (ref 12.0–15.0)
LYMPHS ABS: 1.9 10*3/uL (ref 0.7–4.0)
Lymphocytes Relative: 30.4 % (ref 12.0–46.0)
MCHC: 34.4 g/dL (ref 30.0–36.0)
MCV: 94.6 fl (ref 78.0–100.0)
MONOS PCT: 6.9 % (ref 3.0–12.0)
Monocytes Absolute: 0.4 10*3/uL (ref 0.1–1.0)
NEUTROS ABS: 3.6 10*3/uL (ref 1.4–7.7)
NEUTROS PCT: 58 % (ref 43.0–77.0)
PLATELETS: 192 10*3/uL (ref 150.0–400.0)
RBC: 4.33 Mil/uL (ref 3.87–5.11)
RDW: 13 % (ref 11.5–15.5)
WBC: 6.1 10*3/uL (ref 4.0–10.5)

## 2017-06-12 LAB — COMPREHENSIVE METABOLIC PANEL
ALT: 16 U/L (ref 0–35)
AST: 21 U/L (ref 0–37)
Albumin: 3.9 g/dL (ref 3.5–5.2)
Alkaline Phosphatase: 56 U/L (ref 39–117)
BUN: 19 mg/dL (ref 6–23)
CO2: 27 meq/L (ref 19–32)
Calcium: 9.6 mg/dL (ref 8.4–10.5)
Chloride: 107 mEq/L (ref 96–112)
Creatinine, Ser: 0.86 mg/dL (ref 0.40–1.20)
GFR: 67.55 mL/min (ref >60.00)
GLUCOSE: 115 mg/dL — AB (ref 70–99)
POTASSIUM: 4.4 meq/L (ref 3.5–5.1)
Sodium: 142 mEq/L (ref 135–145)
Total Bilirubin: 0.9 mg/dL (ref 0.2–1.2)
Total Protein: 6.9 g/dL (ref 6.0–8.3)

## 2017-06-12 LAB — LIPID PANEL
Cholesterol: 146 mg/dL (ref 0–200)
HDL: 45.6 mg/dL (ref >39.00)
LDL CALC: 71 mg/dL (ref 0–99)
NONHDL: 100.61
Total CHOL/HDL Ratio: 3
Triglycerides: 149 mg/dL (ref 0.0–149.0)
VLDL: 29.8 mg/dL (ref 0.0–40.0)

## 2017-06-12 LAB — HEMOGLOBIN A1C: HEMOGLOBIN A1C: 5.9 % (ref 4.6–6.5)

## 2017-06-12 LAB — VITAMIN D 25 HYDROXY (VIT D DEFICIENCY, FRACTURES): VITD: 25.4 ng/mL — ABNORMAL LOW (ref 30.00–100.00)

## 2017-06-12 LAB — TSH: TSH: 2.29 u[IU]/mL (ref 0.35–4.50)

## 2017-06-12 NOTE — Progress Notes (Signed)
PCP notes:   Health maintenance:  Foot exam - PCP please address at next appt A1C - completed Microalbumin - completed  Abnormal screenings:   Hearing - failed  Hearing Screening   125Hz  250Hz  500Hz  1000Hz  2000Hz  3000Hz  4000Hz  6000Hz  8000Hz   Right ear:   40 40 40  40    Left ear:   40 40 40  0     Patient concerns:   None  Nurse concerns:  None  Next PCP appt:   06/21/17 @ 1630  I reviewed health advisor's note, was available for consultation, and agree with documentation and plan. Loura Pardon MD
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
## 2017-06-12 NOTE — Telephone Encounter (Signed)
-----   Message from Eustace Pen, LPN sent at 10/06/4173  9:22 PM EDT ----- Regarding: Labs 4/7 Lab orders needed. Thank you.  Insurance:  [facility]

## 2017-06-12 NOTE — Patient Instructions (Signed)
Cindy Whitehead , Thank you for taking time to come for your Medicare Wellness Visit. I appreciate your ongoing commitment to your health goals. Please review the following plan we discussed and let me know if I can assist you in the future.   These are the goals we discussed: Goals    . Reduce sugar intake to X grams per day     Starting 06/12/2017, I will attempt to decrease intake of sweets by limiting access to them in my house.        This is a list of the screening recommended for you and due dates:  Health Maintenance  Topic Date Due  . Complete foot exam   06/21/2017*  . Eye exam for diabetics  06/27/2017  . Mammogram  08/19/2017  . Flu Shot  10/05/2017  . Hemoglobin A1C  12/12/2017  . Urine Protein Check  06/13/2018  . Tetanus Vaccine  10/18/2026  . DEXA scan (bone density measurement)  Completed  . Pneumonia vaccines  Completed  *Topic was postponed. The date shown is not the original due date.   Preventive Care for Adults  A healthy lifestyle and preventive care can promote health and wellness. Preventive health guidelines for adults include the following key practices.  . A routine yearly physical is a good way to check with your health care provider about your health and preventive screening. It is a chance to share any concerns and updates on your health and to receive a thorough exam.  . Visit your dentist for a routine exam and preventive care every 6 months. Brush your teeth twice a day and floss once a day. Good oral hygiene prevents tooth decay and gum disease.  . The frequency of eye exams is based on your age, health, family medical history, use  of contact lenses, and other factors. Follow your health care provider's recommendations for frequency of eye exams.  . Eat a healthy diet. Foods like vegetables, fruits, whole grains, low-fat dairy products, and lean protein foods contain the nutrients you need without too many calories. Decrease your intake of foods high in  solid fats, added sugars, and salt. Eat the right amount of calories for you. Get information about a proper diet from your health care provider, if necessary.  . Regular physical exercise is one of the most important things you can do for your health. Most adults should get at least 150 minutes of moderate-intensity exercise (any activity that increases your heart rate and causes you to sweat) each week. In addition, most adults need muscle-strengthening exercises on 2 or more days a week.  Silver Sneakers may be a benefit available to you. To determine eligibility, you may visit the website: www.silversneakers.com or contact program at 901-050-9024 Mon-Fri between 8AM-8PM.   . Maintain a healthy weight. The body mass index (BMI) is a screening tool to identify possible weight problems. It provides an estimate of body fat based on height and weight. Your health care provider can find your BMI and can help you achieve or maintain a healthy weight.   For adults 20 years and older: ? A BMI below 18.5 is considered underweight. ? A BMI of 18.5 to 24.9 is normal. ? A BMI of 25 to 29.9 is considered overweight. ? A BMI of 30 and above is considered obese.   . Maintain normal blood lipids and cholesterol levels by exercising and minimizing your intake of saturated fat. Eat a balanced diet with plenty of fruit and vegetables. Blood  tests for lipids and cholesterol should begin at age 72 and be repeated every 5 years. If your lipid or cholesterol levels are high, you are over 50, or you are at high risk for heart disease, you may need your cholesterol levels checked more frequently. Ongoing high lipid and cholesterol levels should be treated with medicines if diet and exercise are not working.  . If you smoke, find out from your health care provider how to quit. If you do not use tobacco, please do not start.  . If you choose to drink alcohol, please do not consume more than 2 drinks per day. One drink  is considered to be 12 ounces (355 mL) of beer, 5 ounces (148 mL) of wine, or 1.5 ounces (44 mL) of liquor.  . If you are 46-32 years old, ask your health care provider if you should take aspirin to prevent strokes.  . Use sunscreen. Apply sunscreen liberally and repeatedly throughout the day. You should seek shade when your shadow is shorter than you. Protect yourself by wearing long sleeves, pants, a wide-brimmed hat, and sunglasses year round, whenever you are outdoors.  . Once a month, do a whole body skin exam, using a mirror to look at the skin on your back. Tell your health care provider of new moles, moles that have irregular borders, moles that are larger than a pencil eraser, or moles that have changed in shape or color.

## 2017-06-21 ENCOUNTER — Ambulatory Visit (INDEPENDENT_AMBULATORY_CARE_PROVIDER_SITE_OTHER): Payer: Medicare Other | Admitting: Family Medicine

## 2017-06-21 ENCOUNTER — Encounter: Payer: Self-pay | Admitting: Family Medicine

## 2017-06-21 VITALS — BP 118/68 | HR 71 | Temp 97.6°F | Ht <= 58 in | Wt 184.2 lb

## 2017-06-21 DIAGNOSIS — E559 Vitamin D deficiency, unspecified: Secondary | ICD-10-CM

## 2017-06-21 DIAGNOSIS — E119 Type 2 diabetes mellitus without complications: Secondary | ICD-10-CM | POA: Diagnosis not present

## 2017-06-21 DIAGNOSIS — Z0000 Encounter for general adult medical examination without abnormal findings: Secondary | ICD-10-CM

## 2017-06-21 DIAGNOSIS — E7800 Pure hypercholesterolemia, unspecified: Secondary | ICD-10-CM | POA: Diagnosis not present

## 2017-06-21 DIAGNOSIS — Z6839 Body mass index (BMI) 39.0-39.9, adult: Secondary | ICD-10-CM

## 2017-06-21 DIAGNOSIS — M8580 Other specified disorders of bone density and structure, unspecified site: Secondary | ICD-10-CM

## 2017-06-21 DIAGNOSIS — Z800 Family history of malignant neoplasm of digestive organs: Secondary | ICD-10-CM | POA: Diagnosis not present

## 2017-06-21 DIAGNOSIS — I10 Essential (primary) hypertension: Secondary | ICD-10-CM | POA: Diagnosis not present

## 2017-06-21 DIAGNOSIS — F4323 Adjustment disorder with mixed anxiety and depressed mood: Secondary | ICD-10-CM | POA: Diagnosis not present

## 2017-06-21 MED ORDER — SOLIFENACIN SUCCINATE 10 MG PO TABS: 10.0000 mg | ORAL_TABLET | Freq: Every day | ORAL | 3 refills | Status: DC

## 2017-06-21 MED ORDER — METFORMIN HCL 500 MG PO TABS: 250.0000 mg | ORAL_TABLET | Freq: Two times a day (BID) | ORAL | 3 refills | Status: DC

## 2017-06-21 MED ORDER — ERGOCALCIFEROL 1.25 MG (50000 UT) PO CAPS: 50000.0000 [IU] | ORAL_CAPSULE | ORAL | 0 refills | Status: DC

## 2017-06-21 MED ORDER — SIMVASTATIN 20 MG PO TABS: ORAL_TABLET | ORAL | 3 refills | Status: DC
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
## 2017-06-21 NOTE — Patient Instructions (Addendum)
Start with a 15-30 minute walk per day -stop when you get tired  As you get in better - add 5 minutes per week   If you are interested in the new shingles vaccine (Shingrix) - call your local pharmacy to check on coverage and availability  If affordable- get on the waiting list   Don't forget to schedule your colonoscopy- if you need help call our office  If you decide not to - call and let us know so we can make a plan   Keep taking 5000 iu vitamin D daily  Also take 12 weeks of ergocalciferol (my px)   Please have your eye doctor send [place] a report of your diabetic eye exam   Call your pharmacy to see what meter/strips and lancets are covered by insurance and I will call it in

## 2017-06-23 NOTE — Assessment & Plan Note (Signed)
Fairly stable w/o medication at this time  Reviewed stressors/ coping techniques/symptoms/ support sources/ tx options and side effects in detail today

## 2017-06-23 NOTE — Assessment & Plan Note (Signed)
Discussed how this problem influences overall health and the risks it imposes  Reviewed plan for weight loss with lower calorie diet (via better food choices and also portion control or program like weight watchers) and exercise building up to or more than 30 minutes 5 days per week including some aerobic activity    

## 2017-06-23 NOTE — Assessment & Plan Note (Signed)
bp in fair control at this time  BP Readings from Last 1 Encounters:  06/21/17 118/68   No changes needed Disc lifstyle change with low sodium diet and exercise  Labs reviewed

## 2017-06-23 NOTE — Assessment & Plan Note (Signed)
Reviewed health habits including diet and exercise and skin cancer prevention Reviewed appropriate screening tests for age  Also reviewed health mt list, fam hx and immunization status , as well as social and family history   See HPI Sent for copy of DM eye exam Disc shingrix vaccine  Labs rev  amw reviewed  Wt loss enc  Will replace vit D She will f/u with gyn for annual visit

## 2017-06-23 NOTE — Assessment & Plan Note (Signed)
Due for colonoscopy Pt plans to schedule it for fall  She has no symptoms Will call office if ref needed

## 2017-06-23 NOTE — Assessment & Plan Note (Signed)
Lab Results  Component Value Date   HGBA1C 5.9 06/12/2017   Well controlled with metformin  Rev low glycemic diet and need for wt loss and exercise  Lab Results  Component Value Date   MICROALBUR 1.6 06/12/2017   Eye and foot care reviewed

## 2017-06-23 NOTE — Assessment & Plan Note (Signed)
Low with 5000 iu daily  Px ergocalciferol 50,000 iu weekly for 12 weeks  Disc imp for bone and overall health

## 2017-06-23 NOTE — Assessment & Plan Note (Addendum)
Due for dexa in June-managed by gyn  Low D at 25.4 already on 5000 iu daily  Will tx with ergocalciferol  Disc need for calcium/ vitamin D/ wt bearing exercise and bone density test every 2 y to monitor Disc safety/ fracture risk in detail   No falls or fx

## 2017-06-28 DIAGNOSIS — H40013 Open angle with borderline findings, low risk, bilateral: Secondary | ICD-10-CM | POA: Diagnosis not present

## 2017-06-28 DIAGNOSIS — E119 Type 2 diabetes mellitus without complications: Secondary | ICD-10-CM | POA: Diagnosis not present

## 2017-06-28 LAB — HM DIABETES EYE EXAM

## 2017-06-29 ENCOUNTER — Other Ambulatory Visit: Payer: Self-pay | Admitting: *Deleted

## 2017-06-29 MED ORDER — ONETOUCH DELICA LANCETS 33G MISC: 1 refills | Status: DC

## 2017-06-29 MED ORDER — GLUCOSE BLOOD VI STRP: ORAL_STRIP | 1 refills | Status: DC

## 2017-06-29 MED ORDER — ONETOUCH ULTRA MINI W/DEVICE KIT: PACK | 0 refills | Status: DC

## 2017-07-05 ENCOUNTER — Encounter: Payer: Self-pay | Admitting: Ophthalmology

## 2017-08-10 ENCOUNTER — Encounter: Payer: Self-pay | Admitting: Family Medicine

## 2017-08-10 ENCOUNTER — Ambulatory Visit (INDEPENDENT_AMBULATORY_CARE_PROVIDER_SITE_OTHER): Payer: Medicare Other | Admitting: Family Medicine

## 2017-08-10 VITALS — BP 132/84 | HR 69 | Temp 98.1°F | Ht <= 58 in | Wt 182.2 lb

## 2017-08-10 DIAGNOSIS — S30861A Insect bite (nonvenomous) of abdominal wall, initial encounter: Secondary | ICD-10-CM | POA: Insufficient documentation

## 2017-08-10 DIAGNOSIS — W57XXXA Bitten or stung by nonvenomous insect and other nonvenomous arthropods, initial encounter: Secondary | ICD-10-CM

## 2017-08-10 DIAGNOSIS — M5441 Lumbago with sciatica, right side: Secondary | ICD-10-CM

## 2017-08-10 DIAGNOSIS — G8929 Other chronic pain: Secondary | ICD-10-CM | POA: Diagnosis not present

## 2017-08-10 NOTE — Assessment & Plan Note (Signed)
Small scab present without bullseye pattern or rash  No fever/ joint change or other symptoms  Update if not starting to improve in a week or if worsening    Keep clean with soap and water abx oint otc bid

## 2017-08-10 NOTE — Assessment & Plan Note (Signed)
Ongoing with hx of spinal stenosis - last injdection was years ago with good relief  Naproxen-warmed not to take more than 500 mg with food at a time  Ref to ortho - may benefif from another injection
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
## 2017-08-10 NOTE — Patient Instructions (Addendum)
Use insect spray regularly (DEET)-spray clothes as well as skin   Stop cortisone cream and alcohol  Instead- use soap and water to clean bite  Try antibiotic ointment (like triple or poly sporin) Watch for rash or fever or other new symptoms  If bite site gets bigger or more red also alert me   For back pain - do not take naproxen more than one pill every 12 hours and always take with food  Try some heat on your back for 10 minutes at a time Go back to some of your physical therapy stretches as well  We will refer you to orthopedics

## 2017-08-31 DIAGNOSIS — M545 Low back pain: Secondary | ICD-10-CM | POA: Diagnosis not present

## 2017-08-31 DIAGNOSIS — M48061 Spinal stenosis, lumbar region without neurogenic claudication: Secondary | ICD-10-CM | POA: Diagnosis not present

## 2017-08-31 DIAGNOSIS — M47817 Spondylosis without myelopathy or radiculopathy, lumbosacral region: Secondary | ICD-10-CM | POA: Diagnosis not present

## 2017-09-06 ENCOUNTER — Other Ambulatory Visit: Payer: Self-pay | Admitting: *Deleted

## 2017-09-06 MED ORDER — NAPROXEN 500 MG PO TABS: 500.0000 mg | ORAL_TABLET | Freq: Two times a day (BID) | ORAL | 5 refills | Status: DC | PRN

## 2017-09-06 NOTE — Telephone Encounter (Signed)
F/u on 12/20/17 and CPE already scheduled for next year too. Last filled on 06/14/16 #60 tabs with 5 refills

## 2017-09-12 DIAGNOSIS — K640 First degree hemorrhoids: Secondary | ICD-10-CM | POA: Diagnosis not present

## 2017-09-12 DIAGNOSIS — Z800 Family history of malignant neoplasm of digestive organs: Secondary | ICD-10-CM | POA: Diagnosis not present

## 2017-09-21 ENCOUNTER — Other Ambulatory Visit: Payer: Self-pay | Admitting: *Deleted

## 2017-09-21 NOTE — Telephone Encounter (Signed)
She finished the 12 week course  Now continue taking her 5000 iu otc vit D3 daily  Thanks

## 2017-09-21 NOTE — Telephone Encounter (Signed)
Faxed refill request. Vitamin D2 Last office visit:   08/10/2017 Last Filled:    12 capsule 0 06/21/2017  Please advise.

## 2017-09-22 NOTE — Telephone Encounter (Signed)
Pt informed of below. She states she actually only requested her vesicare refill.

## 2017-11-30 ENCOUNTER — Other Ambulatory Visit: Payer: Self-pay

## 2017-11-30 NOTE — Patient Outreach (Signed)
[facility]) Care Management  11/30/2017  Cindy Whitehead 09/23/1935 [id number]   Medication Adherence call to [name] spoke with patient she said she still has medication and does not need any at this time patient is due on Simvastatin 20 mg patient prefers to order it her self and does not want the pharmacy to automatically refill for her. Mrs. Slabach is showing past due under [place].   [place] Management Direct Dial [phone]  Fax [phone] [email or web address]

## 2017-12-05 DIAGNOSIS — M545 Low back pain: Secondary | ICD-10-CM | POA: Diagnosis not present

## 2017-12-05 DIAGNOSIS — M48061 Spinal stenosis, lumbar region without neurogenic claudication: Secondary | ICD-10-CM | POA: Diagnosis not present

## 2017-12-05 DIAGNOSIS — M47817 Spondylosis without myelopathy or radiculopathy, lumbosacral region: Secondary | ICD-10-CM | POA: Diagnosis not present

## 2017-12-07 ENCOUNTER — Telehealth: Payer: Self-pay | Admitting: Family Medicine

## 2017-12-07 DIAGNOSIS — E7800 Pure hypercholesterolemia, unspecified: Secondary | ICD-10-CM

## 2017-12-07 DIAGNOSIS — I10 Essential (primary) hypertension: Secondary | ICD-10-CM

## 2017-12-07 DIAGNOSIS — E119 Type 2 diabetes mellitus without complications: Secondary | ICD-10-CM

## 2017-12-07 NOTE — Telephone Encounter (Signed)
-----   Message from Lendon Collar, RT sent at 12/04/2017  9:59 AM EDT ----- Regarding: Lab orders for Friday 12/15/17 Please enter lab orders for 56month follow up. Thanks!

## 2017-12-11 DIAGNOSIS — M545 Low back pain: Secondary | ICD-10-CM | POA: Diagnosis not present

## 2017-12-11 DIAGNOSIS — M47817 Spondylosis without myelopathy or radiculopathy, lumbosacral region: Secondary | ICD-10-CM | POA: Diagnosis not present

## 2017-12-11 DIAGNOSIS — M48061 Spinal stenosis, lumbar region without neurogenic claudication: Secondary | ICD-10-CM | POA: Diagnosis not present

## 2017-12-15 ENCOUNTER — Other Ambulatory Visit (INDEPENDENT_AMBULATORY_CARE_PROVIDER_SITE_OTHER): Payer: Medicare Other

## 2017-12-15 DIAGNOSIS — E119 Type 2 diabetes mellitus without complications: Secondary | ICD-10-CM

## 2017-12-15 DIAGNOSIS — E7800 Pure hypercholesterolemia, unspecified: Secondary | ICD-10-CM | POA: Diagnosis not present

## 2017-12-15 DIAGNOSIS — I10 Essential (primary) hypertension: Secondary | ICD-10-CM

## 2017-12-15 LAB — LIPID PANEL
CHOLESTEROL: 141 mg/dL (ref 0–200)
HDL: 52.1 mg/dL (ref >39.00)
LDL Cholesterol: 58 mg/dL (ref 0–99)
NonHDL: 89.22
Total CHOL/HDL Ratio: 3
Triglycerides: 156 mg/dL — ABNORMAL HIGH (ref 0.0–149.0)
VLDL: 31.2 mg/dL (ref 0.0–40.0)

## 2017-12-15 LAB — COMPREHENSIVE METABOLIC PANEL
ALK PHOS: 54 U/L (ref 39–117)
ALT: 27 U/L (ref 0–35)
AST: 28 U/L (ref 0–37)
Albumin: 3.9 g/dL (ref 3.5–5.2)
BUN: 29 mg/dL — ABNORMAL HIGH (ref 6–23)
CHLORIDE: 107 meq/L (ref 96–112)
CO2: 28 mEq/L (ref 19–32)
Calcium: 9.1 mg/dL (ref 8.4–10.5)
Creatinine, Ser: 0.83 mg/dL (ref 0.40–1.20)
GFR: 70.28 mL/min (ref >60.00)
GLUCOSE: 118 mg/dL — AB (ref 70–99)
POTASSIUM: 4.2 meq/L (ref 3.5–5.1)
Sodium: 142 mEq/L (ref 135–145)
TOTAL PROTEIN: 6.8 g/dL (ref 6.0–8.3)
Total Bilirubin: 0.7 mg/dL (ref 0.2–1.2)

## 2017-12-15 LAB — HEMOGLOBIN A1C: HEMOGLOBIN A1C: 5.9 % (ref 4.6–6.5)

## 2017-12-20 ENCOUNTER — Ambulatory Visit: Payer: Medicare Other | Admitting: Family Medicine

## 2017-12-27 ENCOUNTER — Ambulatory Visit (INDEPENDENT_AMBULATORY_CARE_PROVIDER_SITE_OTHER): Payer: Medicare Other | Admitting: Family Medicine

## 2017-12-27 ENCOUNTER — Encounter: Payer: Self-pay | Admitting: Family Medicine

## 2017-12-27 VITALS — BP 118/72 | HR 76 | Temp 98.2°F | Ht <= 58 in | Wt 187.5 lb

## 2017-12-27 DIAGNOSIS — Z6839 Body mass index (BMI) 39.0-39.9, adult: Secondary | ICD-10-CM

## 2017-12-27 DIAGNOSIS — Z23 Encounter for immunization: Secondary | ICD-10-CM

## 2017-12-27 DIAGNOSIS — E119 Type 2 diabetes mellitus without complications: Secondary | ICD-10-CM | POA: Diagnosis not present

## 2017-12-27 DIAGNOSIS — I10 Essential (primary) hypertension: Secondary | ICD-10-CM | POA: Diagnosis not present

## 2017-12-27 DIAGNOSIS — E7800 Pure hypercholesterolemia, unspecified: Secondary | ICD-10-CM | POA: Diagnosis not present

## 2017-12-27 NOTE — Progress Notes (Signed)
Subjective:    Patient ID: Cindy Whitehead, female    DOB: 1935/07/24, 82 y.o.   MRN: [id number]  HPI  Here for 6 mo f/u of chronic health problems  Getting outside with better weather   Wt Readings from Last 3 Encounters:  12/27/17 187 lb 8 oz (85 kg)  08/10/17 182 lb 4 oz (82.7 kg)  06/21/17 184 lb 4 oz (83.6 kg)  trying to eat healthy (she craves sweet things)- tries to keep serving small (candy)  Does love fruit - banana/apple  Exercise - walking now that the weather is better (with her dog)  39.87 kg/m   Flu shot given today   Had colonoscopy in July- came out ok  Has fam hx of colon cancer   Epidural inj for back pain 10/7  Did now work as well as the other one  Tries not to take pain med unless severe   bp is stable today  No cp or palpitations or headaches or edema  No side effects to medicines  BP Readings from Last 3 Encounters:  12/27/17 118/72  08/10/17 132/84  06/21/17 118/68     Diabetes Home sugar results  DM diet - still eats sweets/trying to avoid  Exercise -some walking  Symptoms-none  A1C last  Lab Results  Component Value Date   HGBA1C 5.9 12/15/2017  this is stable from last check  Lab Results  Component Value Date   MICROALBUR 1.6 06/12/2017   neg in April  Eye exam 4/19 neg  (goes again on Monday)  Good foot care  No problems with medications -metformin 250 mg bid  Renal protection-none but microalb nl   Hyperlipidemia  Lab Results  Component Value Date   CHOL 141 12/15/2017   CHOL 146 06/12/2017   CHOL 165 06/09/2016   Lab Results  Component Value Date   HDL 52.10 12/15/2017   HDL 45.60 06/12/2017   HDL 46.60 06/09/2016   Lab Results  Component Value Date   LDLCALC 58 12/15/2017   LDLCALC 71 06/12/2017   LDLCALC 87 06/09/2016   Lab Results  Component Value Date   TRIG 156.0 (H) 12/15/2017   TRIG 149.0 06/12/2017   TRIG 159.0 (H) 06/09/2016   Lab Results  Component Value Date   CHOLHDL 3 12/15/2017   CHOLHDL 3  06/12/2017   CHOLHDL 4 06/09/2016   Lab Results  Component Value Date   LDLDIRECT 94.0 11/11/2014   LDLDIRECT 125.8 09/23/2008   LDLDIRECT 130.3 05/21/2008   Simvastatin and diet  LDL is down and HDL are improved!   Lab Results  Component Value Date   CREATININE 0.83 12/15/2017   BUN 29 (H) 12/15/2017   NA 142 12/15/2017   K 4.2 12/15/2017   CL 107 12/15/2017   CO2 28 12/15/2017    Patient Active Problem List   Diagnosis Date Noted  . Tick bite of abdomen 08/10/2017  . Tinnitus 12/26/2016  . Abrasion of skin 10/17/2016  . Urge incontinence 11/24/2015  . Routine general medical examination at a health care facility 05/20/2015  . Family history of colon cancer 05/20/2015  . Chronic back pain 05/20/2015  . Low back pain radiating to right leg 09/02/2014  . Tendonitis of finger 06/03/2014  . Encounter for Medicare annual wellness exam 05/13/2014  . Adjustment disorder with mixed anxiety and depressed mood 10/29/2013  . GERD (gastroesophageal reflux disease) 10/13/2010  . Vitamin D deficiency 04/14/2010  . Osteopenia 05/26/2008  . ESSENTIAL HYPERTENSION, BENIGN 05/21/2008  .  Obesity 08/22/2007  . REACTION, ACUTE STRESS W/EMOTIONAL DSTURB 09/14/2006  . Diabetes type 2, controlled ([place]) 05/11/2006  . Hyperlipidemia 05/11/2006  . ALLERGIC RHINITIS 05/11/2006  . ASTHMA 05/11/2006   Past Medical History:  Diagnosis Date  . Allergic rhinitis   . Asthma   . Bone loss    ? osteopenia   . Diabetes mellitus   . Hyperlipidemia   . Kidney stones   . Obesity   . Pes planus   . Stress    cares for mohter who is verbally abusive    Past Surgical History:  Procedure Laterality Date  . KNEE SURGERY     Social History   Tobacco Use  . Smoking status: Never Smoker  . Smokeless tobacco: Never Used  Substance Use Topics  . Alcohol use: No    Alcohol/week: 0.0 standard drinks  . Drug use: No   Family History  Problem Relation Age of Onset  . Colon cancer Mother     Allergies  Allergen Reactions  . Atorvastatin Other (See Comments)    REACTION: leg pain  . Ace Inhibitors Other (See Comments)    cough   Current Outpatient Medications on File Prior to Visit  Medication Sig Dispense Refill  . albuterol (PROVENTIL HFA;VENTOLIN HFA) 108 (90 BASE) MCG/ACT inhaler Inhale 2 puffs into the lungs every 6 (six) hours as needed for wheezing or shortness of breath. 1 Inhaler 1  . Blood Glucose Monitoring Suppl (ONE TOUCH ULTRA MINI) w/Device KIT Use to check blood sugar once daily (dx. E11.9) 1 each 0  . ergocalciferol (VITAMIN D2) 50000 units capsule Take 1 capsule (50,000 Units total) by mouth once a week. 12 capsule 0  . glucose blood (ONE TOUCH ULTRA TEST) test strip Use to check blood sugar once daily (dx. E11.9) 100 each 1  . metFORMIN (GLUCOPHAGE) 500 MG tablet Take 0.5 tablets (250 mg total) by mouth 2 (two) times daily. 90 tablet 3  . Multiple Vitamin (MULTIVITAMIN WITH MINERALS) TABS tablet Take 1 tablet by mouth daily.    . naproxen (NAPROSYN) 500 MG tablet Take 1 tablet (500 mg total) by mouth 2 (two) times daily as needed. With a meal 60 tablet 5  . ONETOUCH DELICA LANCETS 40B MISC Use to check blood sugar once daily (dx. E11.9) 100 each 1  . simvastatin (ZOCOR) 20 MG tablet TAKE 1/2 TABLET BY MOUTH EVERY EVENING WITH A LOW FAT SNACK 45 tablet 3  . solifenacin (VESICARE) 10 MG tablet Take 1 tablet (10 mg total) by mouth daily. 90 tablet 3   No current facility-administered medications on file prior to visit.     Review of Systems  Constitutional: Negative for activity change, appetite change, fatigue, fever and unexpected weight change.  HENT: Negative for congestion, ear pain, rhinorrhea, sinus pressure and sore throat.   Eyes: Negative for pain, redness and visual disturbance.  Respiratory: Negative for cough, shortness of breath and wheezing.   Cardiovascular: Negative for chest pain and palpitations.  Gastrointestinal: Negative for abdominal  pain, blood in stool, constipation and diarrhea.  Endocrine: Negative for polydipsia and polyuria.  Genitourinary: Negative for dysuria, frequency and urgency.  Musculoskeletal: Negative for arthralgias, back pain and myalgias.  Skin: Negative for pallor and rash.  Allergic/Immunologic: Negative for environmental allergies.  Neurological: Negative for dizziness, syncope and headaches.  Hematological: Negative for adenopathy. Does not bruise/bleed easily.  Psychiatric/Behavioral: Negative for decreased concentration and dysphoric mood. The patient is not nervous/anxious.  Objective:   Physical Exam  Constitutional: She appears well-developed and well-nourished. No distress.  obese and well appearing   HENT:  Head: Normocephalic and atraumatic.  Mouth/Throat: Oropharynx is clear and moist.  Eyes: Pupils are equal, round, and reactive to light. Conjunctivae and EOM are normal.  Neck: Normal range of motion. Neck supple. No JVD present. Carotid bruit is not present. No thyromegaly present.  Cardiovascular: Normal rate, regular rhythm, normal heart sounds and intact distal pulses. Exam reveals no gallop.  Pulmonary/Chest: Effort normal and breath sounds normal. No respiratory distress. She has no wheezes. She has no rales.  No crackles  Abdominal: Soft. Bowel sounds are normal. She exhibits no distension, no abdominal bruit and no mass. There is no tenderness.  Musculoskeletal: She exhibits no edema.  Lymphadenopathy:    She has no cervical adenopathy.  Neurological: She is alert. She has normal reflexes. She displays normal reflexes. No cranial nerve deficit. Coordination normal.  Skin: Skin is warm and dry. No rash noted. No erythema.  Psychiatric: She has a normal mood and affect.  Pleasant           Assessment & Plan:   Problem List Items Addressed This Visit      Cardiovascular and Mediastinum   ESSENTIAL HYPERTENSION, BENIGN    bp in fair control at this time  BP  Readings from Last 1 Encounters:  12/27/17 118/72   No changes needed Most recent labs reviewed  Disc lifstyle change with low sodium diet and exercise          Endocrine   Diabetes type 2, controlled ([place]) - Primary    Lab Results  Component Value Date   HGBA1C 5.9 12/15/2017   Well controlled Low dose metformin Disc eye and foot care  Also need for wt loss         Other   Hyperlipidemia    Disc goals for lipids and reasons to control them Rev last labs with pt Rev low sat fat diet in detail Continues simvastatin and diet       Obesity    Discussed how this problem influences overall health and the risks it imposes  Reviewed plan for weight loss with lower calorie diet (via better food choices and also portion control or program like weight watchers) and exercise building up to or more than 30 minutes 5 days per week including some aerobic activity          Other Visit Diagnoses    Need for influenza vaccination       Relevant Orders   Flu Vaccine QUAD 6+ mos PF IM (Fluarix Quad PF) (Completed)

## 2017-12-27 NOTE — Patient Instructions (Signed)
Keep walking- short distances with breaks is ok   Labs are stable  Avoid the sweets as much as you can  Eat fruit instead   Try to get most of your carbohydrates from produce (with the exception of white potatoes)  Eat less bread/pasta/rice/snack foods/cereals/sweets and other items from the middle of the grocery store (processed carbs)   Labs are stable  Flu shot today    We will see you in April

## 2017-12-31 NOTE — Assessment & Plan Note (Signed)
Discussed how this problem influences overall health and the risks it imposes  Reviewed plan for weight loss with lower calorie diet (via better food choices and also portion control or program like weight watchers) and exercise building up to or more than 30 minutes 5 days per week including some aerobic activity    

## 2017-12-31 NOTE — Assessment & Plan Note (Signed)
bp in fair control at this time  BP Readings from Last 1 Encounters:  12/27/17 118/72   No changes needed Most recent labs reviewed  Disc lifstyle change with low sodium diet and exercise

## 2017-12-31 NOTE — Assessment & Plan Note (Signed)
Disc goals for lipids and reasons to control them Rev last labs with pt Rev low sat fat diet in detail Continues simvastatin and diet  

## 2017-12-31 NOTE — Assessment & Plan Note (Signed)
Lab Results  Component Value Date   HGBA1C 5.9 12/15/2017   Well controlled Low dose metformin Disc eye and foot care  Also need for wt loss

## 2018-01-01 DIAGNOSIS — E119 Type 2 diabetes mellitus without complications: Secondary | ICD-10-CM | POA: Diagnosis not present

## 2018-01-08 DIAGNOSIS — M545 Low back pain: Secondary | ICD-10-CM | POA: Diagnosis not present

## 2018-01-08 DIAGNOSIS — M47817 Spondylosis without myelopathy or radiculopathy, lumbosacral region: Secondary | ICD-10-CM | POA: Diagnosis not present

## 2018-01-08 DIAGNOSIS — M48061 Spinal stenosis, lumbar region without neurogenic claudication: Secondary | ICD-10-CM | POA: Diagnosis not present

## 2018-06-19 ENCOUNTER — Ambulatory Visit: Payer: Medicare Other

## 2018-06-26 ENCOUNTER — Encounter: Payer: Medicare Other | Admitting: Family Medicine

## 2018-07-09 ENCOUNTER — Ambulatory Visit (INDEPENDENT_AMBULATORY_CARE_PROVIDER_SITE_OTHER): Payer: Medicare Other | Admitting: Family Medicine

## 2018-07-09 ENCOUNTER — Encounter: Payer: Self-pay | Admitting: Family Medicine

## 2018-07-09 DIAGNOSIS — K120 Recurrent oral aphthae: Secondary | ICD-10-CM | POA: Insufficient documentation

## 2018-07-09 MED ORDER — TRIAMCINOLONE ACETONIDE 0.1 % MT PSTE: 1.0000 "application " | PASTE | Freq: Two times a day (BID) | OROMUCOSAL | 1 refills | Status: DC

## 2018-07-09 NOTE — Assessment & Plan Note (Signed)
Patient has a sore in R lower mouth under teeth that sounds like an oral apthous ulcer  Could not adequately visualize with camera May be from trauma or mal fitting denture  inst to keep denture out Resume previous crest tooth paste  Use salt water gargles Will try kenalog in ora base gel if her pharmacy can make it  Magic mouth wash is another option that may be helpful Update if not starting to improve in a week or if worsening   inst her to alert [place] if more spots develop or if any rash on face or cheek

## 2018-07-09 NOTE — Patient Instructions (Signed)
Try the kenalog in orabase gel for your mouth sore/ulcer  Let us know if not improving in a week  Keep partial out until this improves Switch back to your crest toothpaste   Watch for redness or rash on face/ fever or other new symptoms

## 2018-07-09 NOTE — Progress Notes (Signed)
Virtual Visit via Video Note  I connected with Cindy Whitehead on 07/09/18 at 11:30 AM EDT by a video enabled telemedicine application and verified that I am speaking with the correct person using two identifiers.  Location: Patient: home Provider: office  Video quality is good today    I discussed the limitations of evaluation and management by telemedicine and the availability of in person appointments. The patient expressed understanding and agreed to proceed.  History of Present Illness: Patient presents with a sore in her mouth  It is keeping her from putting denture in   Very sore 2 d Salt water gargle ora gel  Can see a sore there- bottom/ right side of mouth  No other spots  No rash on face  No sore throat No fever   No covid exposures   Not a new partial   No trauma to her mouth that she knows of  Used to get canker sores when she was young   She drinks OJ No sour candies   Uses sensidyne tooth paste   (switched from crest)   Review of Systems  Constitutional: Negative for chills, fever, malaise/fatigue and weight loss.  HENT: Negative for congestion, sinus pain and sore throat.   Eyes: Negative for discharge and redness.  Respiratory: Negative for cough and shortness of breath.   Cardiovascular: Negative for chest pain and palpitations.  Gastrointestinal: Negative for abdominal pain, heartburn, nausea and vomiting.  Musculoskeletal: Negative for neck pain.  Skin: Negative for itching and rash.  Neurological: Negative for dizziness and headaches.    Patient Active Problem List   Diagnosis Date Noted  . Oral aphthous ulcer 07/09/2018  . Tinnitus 12/26/2016  . Urge incontinence 11/24/2015  . Routine general medical examination at a health care facility 05/20/2015  . Family history of colon cancer 05/20/2015  . Chronic back pain 05/20/2015  . Low back pain radiating to right leg 09/02/2014  . Encounter for Medicare annual wellness exam 05/13/2014  .  Adjustment disorder with mixed anxiety and depressed mood 10/29/2013  . GERD (gastroesophageal reflux disease) 10/13/2010  . Vitamin D deficiency 04/14/2010  . Osteopenia 05/26/2008  . ESSENTIAL HYPERTENSION, BENIGN 05/21/2008  . Obesity 08/22/2007  . REACTION, ACUTE STRESS W/EMOTIONAL DSTURB 09/14/2006  . Diabetes type 2, controlled ([place]) 05/11/2006  . Hyperlipidemia 05/11/2006  . ALLERGIC RHINITIS 05/11/2006  . ASTHMA 05/11/2006   Past Medical History:  Diagnosis Date  . Allergic rhinitis   . Asthma   . Bone loss    ? osteopenia   . Diabetes mellitus   . Hyperlipidemia   . Kidney stones   . Obesity   . Pes planus   . Stress    cares for mohter who is verbally abusive    Past Surgical History:  Procedure Laterality Date  . KNEE SURGERY     Social History   Tobacco Use  . Smoking status: Never Smoker  . Smokeless tobacco: Never Used  Substance Use Topics  . Alcohol use: No    Alcohol/week: 0.0 standard drinks  . Drug use: No   Family History  Problem Relation Age of Onset  . Colon cancer Mother    Allergies  Allergen Reactions  . Atorvastatin Other (See Comments)    REACTION: leg pain  . Ace Inhibitors Other (See Comments)    cough   Current Outpatient Medications on File Prior to Visit  Medication Sig Dispense Refill  . albuterol (PROVENTIL HFA;VENTOLIN HFA) 108 (90 BASE) MCG/ACT inhaler Inhale  2 puffs into the lungs every 6 (six) hours as needed for wheezing or shortness of breath. 1 Inhaler 1  . Blood Glucose Monitoring Suppl (ONE TOUCH ULTRA MINI) w/Device KIT Use to check blood sugar once daily (dx. E11.9) 1 each 0  . ergocalciferol (VITAMIN D2) 50000 units capsule Take 1 capsule (50,000 Units total) by mouth once a week. 12 capsule 0  . glucose blood (ONE TOUCH ULTRA TEST) test strip Use to check blood sugar once daily (dx. E11.9) 100 each 1  . metFORMIN (GLUCOPHAGE) 500 MG tablet Take 0.5 tablets (250 mg total) by mouth 2 (two) times daily. 90 tablet 3   . Multiple Vitamin (MULTIVITAMIN WITH MINERALS) TABS tablet Take 1 tablet by mouth daily.    . naproxen (NAPROSYN) 500 MG tablet Take 1 tablet (500 mg total) by mouth 2 (two) times daily as needed. With a meal 60 tablet 5  . ONETOUCH DELICA LANCETS 34K MISC Use to check blood sugar once daily (dx. E11.9) 100 each 1  . simvastatin (ZOCOR) 20 MG tablet TAKE 1/2 TABLET BY MOUTH EVERY EVENING WITH A LOW FAT SNACK 45 tablet 3  . solifenacin (VESICARE) 10 MG tablet Take 1 tablet (10 mg total) by mouth daily. 90 tablet 3   No current facility-administered medications on file prior to visit.     Observations/Objective: Patient appears well/her normal self  She has taken out her lower partial and is missing lower front teeth Points to area of gum inside R jaw/under teeth as affected area  No obv swelling- but eval is limited by camera quality She is not tender in face or jaw to palpation by herself  No facial swelling or asymmetry Not hoarse or slurring  Mentally sharp/pleasant and nl affect No skin change/rash or pallor   Assessment and Plan: Problem List Items Addressed This Visit      Digestive   Oral aphthous ulcer    Patient has a sore in R lower mouth under teeth that sounds like an oral apthous ulcer  Could not adequately visualize with camera May be from trauma or mal fitting denture  inst to keep denture out Resume previous crest tooth paste  Use salt water gargles Will try kenalog in ora base gel if her pharmacy can make it  Magic mouth wash is another option that may be helpful Update if not starting to improve in a week or if worsening   inst her to alert [place] if more spots develop or if any rash on face or cheek           Follow Up Instructions: Try the kenalog in orabase gel for your mouth sore/ulcer  Let us know if not improving in a week  Keep partial out until this improves Switch back to your crest toothpaste   Watch for redness or rash on face/ fever or other  new symptoms    I discussed the assessment and treatment plan with the patient. The patient was provided an opportunity to ask questions and all were answered. The patient agreed with the plan and demonstrated an understanding of the instructions.   The patient was advised to call back or seek an in-person evaluation if the symptoms worsen or if the condition fails to improve as anticipated.     Loura Pardon, MD

## 2018-08-05 ENCOUNTER — Other Ambulatory Visit: Payer: Self-pay | Admitting: Family Medicine

## 2018-08-09 ENCOUNTER — Other Ambulatory Visit: Payer: Self-pay | Admitting: Family Medicine

## 2018-08-14 DIAGNOSIS — E119 Type 2 diabetes mellitus without complications: Secondary | ICD-10-CM | POA: Diagnosis not present

## 2018-08-14 LAB — HM DIABETES EYE EXAM

## 2018-09-04 ENCOUNTER — Encounter: Payer: Self-pay | Admitting: Family Medicine

## 2018-10-09 ENCOUNTER — Other Ambulatory Visit: Payer: Self-pay | Admitting: *Deleted

## 2018-10-09 MED ORDER — NAPROXEN 500 MG PO TABS: 500.0000 mg | ORAL_TABLET | Freq: Two times a day (BID) | ORAL | 0 refills | Status: DC | PRN

## 2018-10-09 NOTE — Telephone Encounter (Signed)
CPE scheduled for 10/30/18, last filled on 09/06/17 #60 tabs with 5 refills, please advised   [facility]

## 2018-10-24 ENCOUNTER — Telehealth: Payer: Self-pay | Admitting: Family Medicine

## 2018-10-24 DIAGNOSIS — E7800 Pure hypercholesterolemia, unspecified: Secondary | ICD-10-CM

## 2018-10-24 DIAGNOSIS — E559 Vitamin D deficiency, unspecified: Secondary | ICD-10-CM

## 2018-10-24 DIAGNOSIS — I10 Essential (primary) hypertension: Secondary | ICD-10-CM

## 2018-10-24 DIAGNOSIS — E119 Type 2 diabetes mellitus without complications: Secondary | ICD-10-CM

## 2018-10-24 NOTE — Telephone Encounter (Signed)
-----   Message from Cloyd Stagers, RT sent at 10/18/2018  3:15 PM EDT ----- Regarding: Lab Orders for Thursday 8.20.2020 Please place lab orders for Thursday 8.20.2020, office visit for physical on Tuesday 8.25.2020 Thank you, Dyke Maes RT(R)

## 2018-10-25 ENCOUNTER — Other Ambulatory Visit (INDEPENDENT_AMBULATORY_CARE_PROVIDER_SITE_OTHER): Payer: Medicare Other

## 2018-10-25 ENCOUNTER — Ambulatory Visit: Payer: Medicare Other

## 2018-10-25 DIAGNOSIS — E7800 Pure hypercholesterolemia, unspecified: Secondary | ICD-10-CM

## 2018-10-25 DIAGNOSIS — I10 Essential (primary) hypertension: Secondary | ICD-10-CM

## 2018-10-25 DIAGNOSIS — E559 Vitamin D deficiency, unspecified: Secondary | ICD-10-CM | POA: Diagnosis not present

## 2018-10-25 DIAGNOSIS — E119 Type 2 diabetes mellitus without complications: Secondary | ICD-10-CM

## 2018-10-25 LAB — CBC WITH DIFFERENTIAL/PLATELET
Basophils Absolute: 0.1 10*3/uL (ref 0.0–0.1)
Basophils Relative: 0.9 % (ref 0.0–3.0)
Eosinophils Absolute: 0.2 10*3/uL (ref 0.0–0.7)
Eosinophils Relative: 4 % (ref 0.0–5.0)
HCT: 41.4 % (ref 36.0–46.0)
Hemoglobin: 13.9 g/dL (ref 12.0–15.0)
Lymphocytes Relative: 30.9 % (ref 12.0–46.0)
Lymphs Abs: 1.9 10*3/uL (ref 0.7–4.0)
MCHC: 33.6 g/dL (ref 30.0–36.0)
MCV: 96 fl (ref 78.0–100.0)
Monocytes Absolute: 0.4 10*3/uL (ref 0.1–1.0)
Monocytes Relative: 6.5 % (ref 3.0–12.0)
Neutro Abs: 3.6 10*3/uL (ref 1.4–7.7)
Neutrophils Relative %: 57.7 % (ref 43.0–77.0)
Platelets: 191 10*3/uL (ref 150.0–400.0)
RBC: 4.31 Mil/uL (ref 3.87–5.11)
RDW: 12.8 % (ref 11.5–15.5)
WBC: 6.2 10*3/uL (ref 4.0–10.5)

## 2018-10-25 LAB — HEMOGLOBIN A1C: Hgb A1c MFr Bld: 6.1 % (ref 4.6–6.5)

## 2018-10-25 LAB — LIPID PANEL
Cholesterol: 159 mg/dL (ref 0–200)
HDL: 47.3 mg/dL (ref >39.00)
LDL Cholesterol: 87 mg/dL (ref 0–99)
NonHDL: 111.34
Total CHOL/HDL Ratio: 3
Triglycerides: 122 mg/dL (ref 0.0–149.0)
VLDL: 24.4 mg/dL (ref 0.0–40.0)

## 2018-10-25 LAB — COMPREHENSIVE METABOLIC PANEL
ALT: 18 U/L (ref 0–35)
AST: 23 U/L (ref 0–37)
Albumin: 4.1 g/dL (ref 3.5–5.2)
Alkaline Phosphatase: 70 U/L (ref 39–117)
BUN: 16 mg/dL (ref 6–23)
CO2: 25 mEq/L (ref 19–32)
Calcium: 9.2 mg/dL (ref 8.4–10.5)
Chloride: 107 mEq/L (ref 96–112)
Creatinine, Ser: 0.81 mg/dL (ref 0.40–1.20)
GFR: 67.86 mL/min (ref >60.00)
Glucose, Bld: 123 mg/dL — ABNORMAL HIGH (ref 70–99)
Potassium: 4.4 mEq/L (ref 3.5–5.1)
Sodium: 142 mEq/L (ref 135–145)
Total Bilirubin: 0.7 mg/dL (ref 0.2–1.2)
Total Protein: 6.9 g/dL (ref 6.0–8.3)

## 2018-10-25 LAB — VITAMIN D 25 HYDROXY (VIT D DEFICIENCY, FRACTURES): VITD: 34.68 ng/mL (ref 30.00–100.00)

## 2018-10-25 LAB — TSH: TSH: 3.13 u[IU]/mL (ref 0.35–4.50)

## 2018-10-30 ENCOUNTER — Ambulatory Visit (INDEPENDENT_AMBULATORY_CARE_PROVIDER_SITE_OTHER): Payer: Medicare Other | Admitting: Family Medicine

## 2018-10-30 ENCOUNTER — Encounter: Payer: Self-pay | Admitting: Family Medicine

## 2018-10-30 ENCOUNTER — Other Ambulatory Visit: Payer: Self-pay

## 2018-10-30 VITALS — BP 130/64 | HR 75 | Temp 98.0°F | Ht <= 58 in | Wt 184.4 lb

## 2018-10-30 DIAGNOSIS — Z0000 Encounter for general adult medical examination without abnormal findings: Secondary | ICD-10-CM

## 2018-10-30 DIAGNOSIS — E559 Vitamin D deficiency, unspecified: Secondary | ICD-10-CM

## 2018-10-30 DIAGNOSIS — E119 Type 2 diabetes mellitus without complications: Secondary | ICD-10-CM

## 2018-10-30 DIAGNOSIS — M8580 Other specified disorders of bone density and structure, unspecified site: Secondary | ICD-10-CM | POA: Diagnosis not present

## 2018-10-30 DIAGNOSIS — Z23 Encounter for immunization: Secondary | ICD-10-CM | POA: Diagnosis not present

## 2018-10-30 DIAGNOSIS — E785 Hyperlipidemia, unspecified: Secondary | ICD-10-CM

## 2018-10-30 DIAGNOSIS — I10 Essential (primary) hypertension: Secondary | ICD-10-CM

## 2018-10-30 DIAGNOSIS — E1169 Type 2 diabetes mellitus with other specified complication: Secondary | ICD-10-CM

## 2018-10-30 DIAGNOSIS — Z6839 Body mass index (BMI) 39.0-39.9, adult: Secondary | ICD-10-CM

## 2018-10-30 DIAGNOSIS — N3941 Urge incontinence: Secondary | ICD-10-CM

## 2018-10-30 LAB — MICROALBUMIN / CREATININE URINE RATIO
Creatinine,U: 127.6 mg/dL
Microalb Creat Ratio: 1.7 mg/g (ref 0.0–30.0)
Microalb, Ur: 2.2 mg/dL — ABNORMAL HIGH (ref 0.0–1.9)

## 2018-10-30 MED ORDER — SIMVASTATIN 20 MG PO TABS: ORAL_TABLET | ORAL | 3 refills | Status: DC

## 2018-10-30 MED ORDER — METFORMIN HCL 500 MG PO TABS: 250.0000 mg | ORAL_TABLET | Freq: Two times a day (BID) | ORAL | 3 refills | Status: DC

## 2018-10-30 NOTE — Assessment & Plan Note (Signed)
dexa 6/17- per pt managed by gyn  She will disc scheduling this at her next gyn appt No falls or fx  D level improved now in the 30s  Continue ca and D Enc more exercise when able

## 2018-10-30 NOTE — Assessment & Plan Note (Signed)
vesicare works well but no longer covered by her ins  She will call for a list of covered alt agents and let us know

## 2018-10-30 NOTE — Assessment & Plan Note (Signed)
Reviewed health habits including diet and exercise and skin cancer prevention Reviewed appropriate screening tests for age  Also reviewed health mt list, fam hx and immunization status , as well as social and family history   See HPI Labs reviewed  Pt will schedule gyn appt to get her mammogram and dexa  Flu shot given today  Has advance directive Cognitive function is good/no problems  Disc imp of wt loss for better health Good hearing screen and utd with dm eye exam

## 2018-10-30 NOTE — Progress Notes (Signed)
Subjective:    Patient ID: Cindy Whitehead, female    DOB: 1935-05-18, 83 y.o.   MRN: [id number]  HPI Pt presents for amw and annual health mt exam with rev of chronic medical problems   I have personally reviewed the Medicare Annual Wellness questionnaire and have noted 1. The patient's medical and social history 2. Their use of alcohol, tobacco or illicit drugs 3. Their current medications and supplements 4. The patient's functional ability including ADL's, fall risks, home safety risks and hearing or visual             impairment. 5. Diet and physical activities 6. Evidence for depression or mood disorders  The patients weight, height, BMI have been recorded in the chart and visual acuity is per eye clinic.  I have made referrals, counseling and provided education to the patient based review of the above and I have provided the pt with a written personalized care plan for preventive services. Reviewed and updated provider list, see scanned forms.  See scanned forms.  Routine anticipatory guidance given to patient.  See health maintenance. Colon cancer screening 7/19 colonoscopy (fam hx)- said no recall  Breast cancer screening mammogram 6/18  (gets it at Dr Johnney Ou office)  Self breast exam-no lumps  Flu vaccine-wants today Tetanus vaccine 8/18 Td Pneumovax completed  Zoster vaccine shingrix 5/19  dexa 6/17 osteopenia -managed by gyn/will plan it in their office  Falls- none Fractures-none  Supplements vit D and ca  D level 34.6 improved  Exercise - walking in the house  Advance directive-has a living will and poa  (daughter is her poa) Cognitive function addressed- see scanned forms- and if abnormal then additional documentation follows.  No memory problems at all-still does finances and household things  Concentrates well   PMH and SH reviewed  Meds, vitals, and allergies reviewed.   ROS: See HPI.  Otherwise negative.   Staying home during the pandemic  Wt Readings  from Last 3 Encounters:  10/30/18 184 lb 7 oz (83.7 kg)  12/27/17 187 lb 8 oz (85 kg)  08/10/17 182 lb 4 oz (82.7 kg)  too hot to get outdoors  Thinking about exercise videos  Eating healthier now that she is not out  Less chips and cookies and candy  38.88 kg/m    Hearing Screening   125Hz  250Hz  500Hz  1000Hz  2000Hz  3000Hz  4000Hz  6000Hz  8000Hz   Right ear:   40 40 40  40    Left ear:   40 40 40  40    Vision Screening Comments: Pt had eye exam at with Dr. Delman Cheadle on 08/14/18  bp is stable today  No cp or palpitations or headaches or edema  No side effects to medicines  BP Readings from Last 3 Encounters:  10/30/18 130/64  12/27/17 118/72  08/10/17 132/84      DM2 Lab Results  Component Value Date   HGBA1C 6.1 10/25/2018  up from 5.9  Low dose metformin  Foot care-good/no problems  Needs microalb test    Hyperlipidemia Lab Results  Component Value Date   CHOL 159 10/25/2018   CHOL 141 12/15/2017   CHOL 146 06/12/2017   Lab Results  Component Value Date   HDL 47.30 10/25/2018   HDL 52.10 12/15/2017   HDL 45.60 06/12/2017   Lab Results  Component Value Date   LDLCALC 87 10/25/2018   [place] 58 12/15/2017   [place] 71 06/12/2017   Lab Results  Component Value Date  TRIG 122.0 10/25/2018   TRIG 156.0 (H) 12/15/2017   TRIG 149.0 06/12/2017   Lab Results  Component Value Date   CHOLHDL 3 10/25/2018   CHOLHDL 3 12/15/2017   CHOLHDL 3 06/12/2017   Lab Results  Component Value Date   LDLDIRECT 94.0 11/11/2014   LDLDIRECT 125.8 09/23/2008   LDLDIRECT 130.3 05/21/2008   zocor and diet  [employer] foods - she eats a lot of it  (is trying to change to shake and bake)  She eats some cheese   Lab Results  Component Value Date   CREATININE 0.81 10/25/2018   BUN 16 10/25/2018   NA 142 10/25/2018   K 4.4 10/25/2018   CL 107 10/25/2018   CO2 25 10/25/2018   Lab Results  Component Value Date   ALT 18 10/25/2018   AST 23 10/25/2018   ALKPHOS 70 10/25/2018    BILITOT 0.7 10/25/2018    Lab Results  Component Value Date   TSH 3.13 10/25/2018    Lab Results  Component Value Date   WBC 6.2 10/25/2018   HGB 13.9 10/25/2018   HCT 41.4 10/25/2018   MCV 96.0 10/25/2018   PLT 191.0 10/25/2018     Patient Active Problem List   Diagnosis Date Noted  . Oral aphthous ulcer 07/09/2018  . Tinnitus 12/26/2016  . Urge incontinence 11/24/2015  . Routine general medical examination at a health care facility 05/20/2015  . Family history of colon cancer 05/20/2015  . Chronic back pain 05/20/2015  . Low back pain radiating to right leg 09/02/2014  . Encounter for Medicare annual wellness exam 05/13/2014  . Adjustment disorder with mixed anxiety and depressed mood 10/29/2013  . GERD (gastroesophageal reflux disease) 10/13/2010  . Vitamin D deficiency 04/14/2010  . Osteopenia 05/26/2008  . ESSENTIAL HYPERTENSION, BENIGN 05/21/2008  . Class 2 severe obesity due to excess calories with serious comorbidity and body mass index (BMI) of 39.0 to 39.9 in adult ([place]) 08/22/2007  . REACTION, ACUTE STRESS W/EMOTIONAL DSTURB 09/14/2006  . Diabetes type 2, controlled ([place]) 05/11/2006  . Hyperlipidemia associated with type 2 diabetes mellitus ([place]) 05/11/2006  . ALLERGIC RHINITIS 05/11/2006  . ASTHMA 05/11/2006   Past Medical History:  Diagnosis Date  . Allergic rhinitis   . Asthma   . Bone loss    ? osteopenia   . Diabetes mellitus   . Hyperlipidemia   . Kidney stones   . Obesity   . Pes planus   . Stress    cares for mohter who is verbally abusive    Past Surgical History:  Procedure Laterality Date  . KNEE SURGERY     Social History   Tobacco Use  . Smoking status: Never Smoker  . Smokeless tobacco: Never Used  Substance Use Topics  . Alcohol use: No    Alcohol/week: 0.0 standard drinks  . Drug use: No   Family History  Problem Relation Age of Onset  . Colon cancer Mother    Allergies  Allergen Reactions  . Atorvastatin Other (See  Comments)    REACTION: leg pain  . Ace Inhibitors Other (See Comments)    cough   Current Outpatient Medications on File Prior to Visit  Medication Sig Dispense Refill  . albuterol (PROVENTIL HFA;VENTOLIN HFA) 108 (90 BASE) MCG/ACT inhaler Inhale 2 puffs into the lungs every 6 (six) hours as needed for wheezing or shortness of breath. 1 Inhaler 1  . Blood Glucose Monitoring Suppl (ONE TOUCH ULTRA MINI) w/Device KIT Use to  check blood sugar once daily (dx. E11.9) 1 each 0  . glucose blood (ONE TOUCH ULTRA TEST) test strip Use to check blood sugar once daily (dx. E11.9) 100 each 1  . Multiple Vitamin (MULTIVITAMIN WITH MINERALS) TABS tablet Take 1 tablet by mouth daily.    . naproxen (NAPROSYN) 500 MG tablet Take 1 tablet (500 mg total) by mouth 2 (two) times daily as needed. With a meal 60 tablet 0  . ONETOUCH DELICA LANCETS 34L MISC Use to check blood sugar once daily (dx. E11.9) 100 each 1  . triamcinolone (KENALOG) 0.1 % paste Use as directed 1 application in the mouth or throat 2 (two) times daily. To affected area 5 g 1  . solifenacin (VESICARE) 10 MG tablet Take 1 tablet (10 mg total) by mouth daily. (Patient not taking: Reported on 10/30/2018) 90 tablet 3   No current facility-administered medications on file prior to visit.     Review of Systems  Constitutional: Negative for activity change, appetite change, fatigue, fever and unexpected weight change.  HENT: Negative for congestion, ear pain, rhinorrhea, sinus pressure and sore throat.   Eyes: Negative for pain, redness and visual disturbance.  Respiratory: Negative for cough, shortness of breath and wheezing.   Cardiovascular: Negative for chest pain and palpitations.  Gastrointestinal: Negative for abdominal pain, blood in stool, constipation and diarrhea.  Endocrine: Negative for polydipsia and polyuria.  Genitourinary: Negative for dysuria, frequency and urgency.  Musculoskeletal: Negative for arthralgias, back pain and  myalgias.  Skin: Negative for pallor and rash.  Allergic/Immunologic: Negative for environmental allergies.  Neurological: Negative for dizziness, syncope and headaches.  Hematological: Negative for adenopathy. Does not bruise/bleed easily.  Psychiatric/Behavioral: Negative for decreased concentration and dysphoric mood. The patient is not nervous/anxious.        Mood has been ok       Objective:   Physical Exam Constitutional:      General: She is not in acute distress.    Appearance: Normal appearance. She is well-developed. She is obese. She is not ill-appearing or diaphoretic.  HENT:     Head: Normocephalic and atraumatic.     Right Ear: Tympanic membrane, ear canal and external ear normal.     Left Ear: Tympanic membrane, ear canal and external ear normal.     Nose: Nose normal.     Mouth/Throat:     Mouth: Mucous membranes are moist.     Pharynx: Oropharynx is clear. No posterior oropharyngeal erythema.  Eyes:     General: No scleral icterus.    Extraocular Movements: Extraocular movements intact.     Conjunctiva/sclera: Conjunctivae normal.     Pupils: Pupils are equal, round, and reactive to light.  Neck:     Musculoskeletal: Normal range of motion and neck supple. No neck rigidity or muscular tenderness.     Thyroid: No thyromegaly.     Vascular: No carotid bruit or JVD.  Cardiovascular:     Rate and Rhythm: Normal rate and regular rhythm.     Pulses: Normal pulses.     Heart sounds: Normal heart sounds. No gallop.   Pulmonary:     Effort: Pulmonary effort is normal. No respiratory distress.     Breath sounds: Normal breath sounds. No wheezing or rales.     Comments: Good air exch Chest:     Chest wall: No tenderness.  Abdominal:     General: Bowel sounds are normal. There is no distension or abdominal bruit.  Palpations: Abdomen is soft. There is no mass.     Tenderness: There is no abdominal tenderness.     Hernia: No hernia is present.  Genitourinary:     Comments: Breast exam: No mass, nodules, thickening, tenderness, bulging, retraction, inflamation, nipple discharge or skin changes noted.  No axillary or clavicular LA.       Musculoskeletal: Normal range of motion.        General: No tenderness.     Right lower leg: No edema.     Left lower leg: No edema.  Lymphadenopathy:     Cervical: No cervical adenopathy.  Skin:    General: Skin is warm and dry.     Coloration: Skin is not pale.     Findings: No erythema or rash.     Comments: Solar lentigines diffusely   Neurological:     Mental Status: She is alert.     Cranial Nerves: No cranial nerve deficit.     Motor: No abnormal muscle tone.     Coordination: Coordination normal.     Gait: Gait normal.     Deep Tendon Reflexes: Reflexes are normal and symmetric. Reflexes normal.  Psychiatric:        Mood and Affect: Mood normal.        Cognition and Memory: Cognition and memory normal.           Assessment & Plan:   Problem List Items Addressed This Visit      Cardiovascular and Mediastinum   ESSENTIAL HYPERTENSION, BENIGN    bp in fair control at this time  BP Readings from Last 1 Encounters:  10/30/18 130/64   No changes needed Most recent labs reviewed  Disc lifstyle change with low sodium diet and exercise        Relevant Medications   simvastatin (ZOCOR) 20 MG tablet     Endocrine   Diabetes type 2, controlled ([place])    Lab Results  Component Value Date   HGBA1C 6.1 10/25/2018  well controlled on low dose metformin  Stressed imp of wt loss/better diet and exercise  microalb test today  utd eye exam        Relevant Medications   metFORMIN (GLUCOPHAGE) 500 MG tablet   simvastatin (ZOCOR) 20 MG tablet   Other Relevant Orders   Microalbumin / creatinine urine ratio (Completed)   Hyperlipidemia associated with type 2 diabetes mellitus ([facility])    LDl cholesterol is up from 50s to 80s  Counseled on diet and goal of LDL under 70 Disc goals for lipids and  reasons to control them Rev last labs with pt Rev low sat fat diet in detail She is eating fried food and cheese regularly  Diet handout given  Continue simvastatin        Relevant Medications   metFORMIN (GLUCOPHAGE) 500 MG tablet   simvastatin (ZOCOR) 20 MG tablet     Musculoskeletal and Integument   Osteopenia    dexa 6/17- per pt managed by gyn  She will disc scheduling this at her next gyn appt No falls or fx  D level improved now in the 30s  Continue ca and D Enc more exercise when able         Other   Class 2 severe obesity due to excess calories with serious comorbidity and body mass index (BMI) of 39.0 to 39.9 in adult [facility])    Discussed how this problem influences overall health and the risks it imposes  Reviewed plan  for weight loss with lower calorie diet (via better food choices and also portion control or program like weight watchers) and exercise building up to or more than 30 minutes 5 days per week including some aerobic activity   Needs to cut fat and carb calories Disc options for indoor exercise       Relevant Medications   metFORMIN (GLUCOPHAGE) 500 MG tablet   Vitamin D deficiency    Improved with D3 otc Level in 30s  Enc compliance with this Disc imp to bone and overall health      Encounter for Medicare annual wellness exam - Primary    Reviewed health habits including diet and exercise and skin cancer prevention Reviewed appropriate screening tests for age  Also reviewed health mt list, fam hx and immunization status , as well as social and family history   See HPI Labs reviewed  Pt will schedule gyn appt to get her mammogram and dexa  Flu shot given today  Has advance directive Cognitive function is good/no problems  Disc imp of wt loss for better health Good hearing screen and utd with dm eye exam         Relevant Orders   Flu Vaccine QUAD 6+ mos PF IM (Fluarix Quad PF) (Completed)   Routine general medical examination at a health  care facility    Reviewed health habits including diet and exercise and skin cancer prevention Reviewed appropriate screening tests for age  Also reviewed health mt list, fam hx and immunization status , as well as social and family history   See HPI Labs reviewed  Pt will schedule gyn appt to get her mammogram and dexa  Flu shot given today  Has advance directive Cognitive function is good/no problems  Disc imp of wt loss for better health Good hearing screen and utd with dm eye exam       Urge incontinence    vesicare works well but no longer covered by her ins  She will call for a list of covered alt agents and let us know        Other Visit Diagnoses    Need for influenza vaccination       Relevant Orders   Flu Vaccine QUAD 6+ mos PF IM (Fluarix Quad PF) (Completed)

## 2018-10-30 NOTE — Assessment & Plan Note (Signed)
Discussed how this problem influences overall health and the risks it imposes  Reviewed plan for weight loss with lower calorie diet (via better food choices and also portion control or program like weight watchers) and exercise building up to or more than 30 minutes 5 days per week including some aerobic activity   Needs to cut fat and carb calories Disc options for indoor exercise

## 2018-10-30 NOTE — Assessment & Plan Note (Signed)
bp in fair control at this time  BP Readings from Last 1 Encounters:  10/30/18 130/64   No changes needed Most recent labs reviewed  Disc lifstyle change with low sodium diet and exercise

## 2018-10-30 NOTE — Assessment & Plan Note (Signed)
LDl cholesterol is up from 50s to 80s  Counseled on diet and goal of LDL under 70 Disc goals for lipids and reasons to control them Rev last labs with pt Rev low sat fat diet in detail She is eating fried food and cheese regularly  Diet handout given  Continue simvastatin

## 2018-10-30 NOTE — Patient Instructions (Addendum)
You need a mammogram  Call and make your appt. At Dr Johnney Ou office  See if you are also due for a bone density test (they may tell you when you call)   Look for some exercise videos  When cooler you can exercise outdoors   Try to eliminate fried food as much as possible For cholesterol Avoid red meat/ fried foods/ egg yolks/ fatty breakfast meats/ butter, cheese and high fat dairy/ and shellfish   Try to pick more lean proteins   Call your insurance co or talk to your pharmacist to see what alternatives they cover to vesicare  Let me know and I will px what is affordable   Flu shot today  Also we need to check a urine protein check for diabetes on the way out

## 2018-10-30 NOTE — Assessment & Plan Note (Signed)
Lab Results  Component Value Date   HGBA1C 6.1 10/25/2018  well controlled on low dose metformin  Stressed imp of wt loss/better diet and exercise  microalb test today  utd eye exam

## 2018-10-30 NOTE — Assessment & Plan Note (Signed)
Improved with D3 otc Level in 30s  Enc compliance with this Disc imp to bone and overall health

## 2018-11-02 ENCOUNTER — Telehealth: Payer: Self-pay | Admitting: Family Medicine

## 2018-11-02 MED ORDER — OXYBUTYNIN CHLORIDE ER 10 MG PO TB24: 10.0000 mg | ORAL_TABLET | Freq: Every day | ORAL | 3 refills | Status: DC

## 2018-11-02 NOTE — Telephone Encounter (Signed)
Best number [phone]  Pt called stating she called her insurance company they will pay for oxypubtuyin for over active bladder  [facility]

## 2018-11-02 NOTE — Telephone Encounter (Signed)
I sent it  All of these medications can cause dry mouth/constipation/fatigue so keep me posted I hope it helpf

## 2018-11-02 NOTE — Telephone Encounter (Signed)
Left VM letting pt know Dr. Tower's comments  

## 2018-12-13 ENCOUNTER — Ambulatory Visit (INDEPENDENT_AMBULATORY_CARE_PROVIDER_SITE_OTHER): Payer: Medicare Other | Admitting: Family Medicine

## 2018-12-13 ENCOUNTER — Other Ambulatory Visit: Payer: Self-pay

## 2018-12-13 ENCOUNTER — Ambulatory Visit (INDEPENDENT_AMBULATORY_CARE_PROVIDER_SITE_OTHER)
Admission: RE | Admit: 2018-12-13 | Discharge: 2018-12-13 | Disposition: A | Discharge status: Home / Self Care | Payer: Medicare Other | Source: Ambulatory Visit | Attending: Family Medicine | Admitting: Family Medicine

## 2018-12-13 ENCOUNTER — Encounter: Payer: Self-pay | Admitting: Family Medicine

## 2018-12-13 DIAGNOSIS — R0789 Other chest pain: Secondary | ICD-10-CM

## 2018-12-13 DIAGNOSIS — R0781 Pleurodynia: Secondary | ICD-10-CM | POA: Diagnosis not present

## 2018-12-13 NOTE — Progress Notes (Signed)
Subjective:    Patient ID: Cindy Whitehead, female    DOB: 01-20-1936, 83 y.o.   MRN: [id number]  HPI  Pt presents with soreness of R chest wall   Wt Readings from Last 3 Encounters:  12/13/18 183 lb 2 oz (83.1 kg)  10/30/18 184 lb 7 oz (83.7 kg)  12/27/17 187 lb 8 oz (85 kg)   38.61 kg/m  Area in anterior ribs is sore to the touch  No redness or swelling No trauma  No bruise No new bras   It is sore to take a deep breath   No cough  No fever No sob  No wheezing No recent illness  No GI symptoms   Had heartburn 2 wk ago- this is not often  Better now   Last mammogram 2017   Patient Active Problem List   Diagnosis Date Noted  . Right-sided chest wall pain 12/13/2018  . Oral aphthous ulcer 07/09/2018  . Tinnitus 12/26/2016  . Urge incontinence 11/24/2015  . Routine general medical examination at a health care facility 05/20/2015  . Family history of colon cancer 05/20/2015  . Chronic back pain 05/20/2015  . Low back pain radiating to right leg 09/02/2014  . Encounter for Medicare annual wellness exam 05/13/2014  . Adjustment disorder with mixed anxiety and depressed mood 10/29/2013  . GERD (gastroesophageal reflux disease) 10/13/2010  . Vitamin D deficiency 04/14/2010  . Osteopenia 05/26/2008  . ESSENTIAL HYPERTENSION, BENIGN 05/21/2008  . Class 2 severe obesity due to excess calories with serious comorbidity and body mass index (BMI) of 39.0 to 39.9 in adult ([place]) 08/22/2007  . REACTION, ACUTE STRESS W/EMOTIONAL DSTURB 09/14/2006  . Diabetes type 2, controlled ([place]) 05/11/2006  . Hyperlipidemia associated with type 2 diabetes mellitus ([place]) 05/11/2006  . ALLERGIC RHINITIS 05/11/2006  . ASTHMA 05/11/2006   Past Medical History:  Diagnosis Date  . Allergic rhinitis   . Asthma   . Bone loss    ? osteopenia   . Diabetes mellitus   . Hyperlipidemia   . Kidney stones   . Obesity   . Pes planus   . Stress    cares for mohter who is verbally abusive     Past Surgical History:  Procedure Laterality Date  . KNEE SURGERY     Social History   Tobacco Use  . Smoking status: Never Smoker  . Smokeless tobacco: Never Used  Substance Use Topics  . Alcohol use: No    Alcohol/week: 0.0 standard drinks  . Drug use: No   Family History  Problem Relation Age of Onset  . Colon cancer Mother    Allergies  Allergen Reactions  . Atorvastatin Other (See Comments)    REACTION: leg pain  . Ace Inhibitors Other (See Comments)    cough   Current Outpatient Medications on File Prior to Visit  Medication Sig Dispense Refill  . albuterol (PROVENTIL HFA;VENTOLIN HFA) 108 (90 BASE) MCG/ACT inhaler Inhale 2 puffs into the lungs every 6 (six) hours as needed for wheezing or shortness of breath. 1 Inhaler 1  . Blood Glucose Monitoring Suppl (ONE TOUCH ULTRA MINI) w/Device KIT Use to check blood sugar once daily (dx. E11.9) 1 each 0  . glucose blood (ONE TOUCH ULTRA TEST) test strip Use to check blood sugar once daily (dx. E11.9) 100 each 1  . metFORMIN (GLUCOPHAGE) 500 MG tablet Take 0.5 tablets (250 mg total) by mouth 2 (two) times daily. 90 tablet 3  . Multiple Vitamin (MULTIVITAMIN  WITH MINERALS) TABS tablet Take 1 tablet by mouth daily.    . naproxen (NAPROSYN) 500 MG tablet Take 1 tablet (500 mg total) by mouth 2 (two) times daily as needed. With a meal 60 tablet 0  . ONETOUCH DELICA LANCETS 80D MISC Use to check blood sugar once daily (dx. E11.9) 100 each 1  . oxybutynin (DITROPAN XL) 10 MG 24 hr tablet Take 1 tablet (10 mg total) by mouth at bedtime. 90 tablet 3  . simvastatin (ZOCOR) 20 MG tablet TAKE 1/2 TABLET BY MOUTH EVERY EVENING WITH A LOW FAT SNACK 45 tablet 3  . triamcinolone (KENALOG) 0.1 % paste Use as directed 1 application in the mouth or throat 2 (two) times daily. To affected area 5 g 1   No current facility-administered medications on file prior to visit.      Review of Systems  Constitutional: Negative for activity change,  appetite change, fatigue, fever and unexpected weight change.  HENT: Negative for congestion, ear pain, rhinorrhea, sinus pressure and sore throat.   Eyes: Negative for pain, redness and visual disturbance.  Respiratory: Negative for cough, shortness of breath and wheezing.   Cardiovascular: Negative for chest pain and palpitations.  Gastrointestinal: Negative for abdominal pain, blood in stool, constipation and diarrhea.  Endocrine: Negative for polydipsia and polyuria.  Genitourinary: Negative for dysuria, frequency and urgency.  Musculoskeletal: Negative for arthralgias, back pain and myalgias.       Pain in chest wall on the right  Sore ribs  Skin: Negative for pallor and rash.  Allergic/Immunologic: Negative for environmental allergies.  Neurological: Negative for dizziness, syncope and headaches.  Hematological: Negative for adenopathy. Does not bruise/bleed easily.  Psychiatric/Behavioral: Negative for decreased concentration and dysphoric mood. The patient is not nervous/anxious.        Objective:   Physical Exam Constitutional:      General: She is not in acute distress.    Appearance: Normal appearance. She is obese. She is not ill-appearing or diaphoretic.  HENT:     Head: Normocephalic and atraumatic.     Mouth/Throat:     Mouth: Mucous membranes are moist.     Pharynx: Oropharynx is clear.  Eyes:     General: No scleral icterus.    Conjunctiva/sclera: Conjunctivae normal.     Pupils: Pupils are equal, round, and reactive to light.  Neck:     Musculoskeletal: Normal range of motion and neck supple. No neck rigidity or muscular tenderness.     Vascular: No carotid bruit.  Cardiovascular:     Rate and Rhythm: Normal rate and regular rhythm.     Pulses: Normal pulses.     Heart sounds: Normal heart sounds.  Pulmonary:     Effort: Pulmonary effort is normal. No respiratory distress.     Breath sounds: Normal breath sounds. No stridor. No wheezing, rhonchi or rales.      Comments: Tender in mid anterior/lateral R chest wall  No crepitus or skin change Chest:     Chest wall: Tenderness present.  Abdominal:     General: Abdomen is flat. Bowel sounds are normal. There is no distension.     Palpations: Abdomen is soft. There is no mass.     Tenderness: There is no abdominal tenderness.  Musculoskeletal:     Right lower leg: No edema.     Left lower leg: No edema.  Lymphadenopathy:     Cervical: No cervical adenopathy.  Skin:    General: Skin is warm and dry.  Coloration: Skin is not pale.     Findings: No erythema or rash.  Neurological:     Mental Status: She is alert.     Cranial Nerves: No cranial nerve deficit.     Sensory: No sensory deficit.     Motor: No weakness.  Psychiatric:        Mood and Affect: Mood normal.           Assessment & Plan:   Problem List Items Addressed This Visit      Other   Right-sided chest wall pain    With tenderness - R anterolateral ribs and intercostal space  No skin changes and otherwise nl exam Suspect costochondritis  Handout given  Rib xr ordered to r/o other process Recommend warm compresses and her naproxen prn  Watch for sob/fever/cough or worse pain  Update if not starting to improve in a week or if worsening        Relevant Orders   DG Ribs Unilateral Right

## 2018-12-13 NOTE — Assessment & Plan Note (Signed)
With tenderness - R anterolateral ribs and intercostal space  No skin changes and otherwise nl exam Suspect costochondritis  Handout given  Rib xr ordered to r/o other process Recommend warm compresses and her naproxen prn  Watch for sob/fever/cough or worse pain  Update if not starting to improve in a week or if worsening

## 2018-12-13 NOTE — Patient Instructions (Addendum)
Xray now of ribs  Use a warm compress as needed for 10 minutes at a time Take a good deep breath at least once per hour   If pain worsens you can try your naproxen (with food)

## 2018-12-25 ENCOUNTER — Ambulatory Visit (INDEPENDENT_AMBULATORY_CARE_PROVIDER_SITE_OTHER): Payer: Medicare Other | Admitting: Family Medicine

## 2018-12-25 ENCOUNTER — Telehealth: Payer: Self-pay

## 2018-12-25 ENCOUNTER — Encounter: Payer: Self-pay | Admitting: Family Medicine

## 2018-12-25 DIAGNOSIS — R42 Dizziness and giddiness: Secondary | ICD-10-CM | POA: Diagnosis not present

## 2018-12-25 MED ORDER — MECLIZINE HCL 25 MG PO TABS: 25.0000 mg | ORAL_TABLET | Freq: Three times a day (TID) | ORAL | 0 refills | Status: DC | PRN

## 2018-12-25 NOTE — Telephone Encounter (Signed)
Aware-will watch for records thanks

## 2018-12-25 NOTE — Patient Instructions (Addendum)
Try meclizine 25 mg up to three times daily as needed for dizziness It should really help at night  The days you take this-hold your bladder medicine (oxybutinin)  Change position slowly   Start drinking more fluid - 64 oz of fluid per day  Mostly water   Update if not starting to improve in a week or if worsening

## 2018-12-25 NOTE — Telephone Encounter (Signed)
Pt has dizziness for 3 days that is worse today. Pt said room is not spinning but pt feels like it is going to spin. Pt has generalized weakness and is nauseated now. Pt has had H/A last 2 mornings but H/A resolves on its on as the day goes on. Pt does have SOB but no CP. Pt does not have a way to ck BP. Pt has no covid symptoms except SOB, H/A and weakness, no travel and no known exposure to + covid. Pt will have her daughter take her to an [facility] in [place]. FYI to Dr Glori Bickers.

## 2018-12-25 NOTE — Progress Notes (Signed)
Virtual Visit via Video Note  I connected with Cindy Whitehead on 12/25/18 at  4:15 PM EDT by a video enabled telemedicine application and verified that I am speaking with the correct person using two identifiers.  Location: Patient: home Provider: office    I discussed the limitations of evaluation and management by telemedicine and the availability of in person appointments. The patient expressed understanding and agreed to proceed.  History of Present Illness: Pt is here for urgent care follow up -fast med Was seen today for dizziness (for 3 days)  Thought possibly related to poor hydration and ear issue EKG done cmp and cbc drawn   Pt c/o dizziness Feels like the room is going to spin but does not  No falls and no stumbling  No head trauma  occ feels a little nausea-no vomiting  Top part of her head feels a little sore on the top all the way across  No ear symptoms   Never had this before   No cp or palpitations  No sinus issues  No congestion or facial pain or cough   Dizziness is much worse when she rolls over in bed  Not turning head sides/up/down   Lab work is pending   Is just a little dizziness right now  It tends to get worse at night   No sick exposures No covid exposures   Patient Active Problem List   Diagnosis Date Noted  . Dizziness 12/25/2018  . Right-sided chest wall pain 12/13/2018  . Oral aphthous ulcer 07/09/2018  . Tinnitus 12/26/2016  . Urge incontinence 11/24/2015  . Routine general medical examination at a health care facility 05/20/2015  . Family history of colon cancer 05/20/2015  . Chronic back pain 05/20/2015  . Low back pain radiating to right leg 09/02/2014  . Encounter for Medicare annual wellness exam 05/13/2014  . Adjustment disorder with mixed anxiety and depressed mood 10/29/2013  . GERD (gastroesophageal reflux disease) 10/13/2010  . Vitamin D deficiency 04/14/2010  . Osteopenia 05/26/2008  . ESSENTIAL HYPERTENSION, BENIGN  05/21/2008  . Class 2 severe obesity due to excess calories with serious comorbidity and body mass index (BMI) of 39.0 to 39.9 in adult ([place]) 08/22/2007  . REACTION, ACUTE STRESS W/EMOTIONAL DSTURB 09/14/2006  . Diabetes type 2, controlled ([place]) 05/11/2006  . Hyperlipidemia associated with type 2 diabetes mellitus ([place]) 05/11/2006  . ALLERGIC RHINITIS 05/11/2006  . ASTHMA 05/11/2006   Past Medical History:  Diagnosis Date  . Allergic rhinitis   . Asthma   . Bone loss    ? osteopenia   . Diabetes mellitus   . Hyperlipidemia   . Kidney stones   . Obesity   . Pes planus   . Stress    cares for mohter who is verbally abusive    Past Surgical History:  Procedure Laterality Date  . KNEE SURGERY     Social History   Tobacco Use  . Smoking status: Never Smoker  . Smokeless tobacco: Never Used  Substance Use Topics  . Alcohol use: No    Alcohol/week: 0.0 standard drinks  . Drug use: No   Family History  Problem Relation Age of Onset  . Colon cancer Mother    Allergies  Allergen Reactions  . Atorvastatin Other (See Comments)    REACTION: leg pain  . Ace Inhibitors Other (See Comments)    cough   Current Outpatient Medications on File Prior to Visit  Medication Sig Dispense Refill  . albuterol (PROVENTIL  HFA;VENTOLIN HFA) 108 (90 BASE) MCG/ACT inhaler Inhale 2 puffs into the lungs every 6 (six) hours as needed for wheezing or shortness of breath. 1 Inhaler 1  . Blood Glucose Monitoring Suppl (ONE TOUCH ULTRA MINI) w/Device KIT Use to check blood sugar once daily (dx. E11.9) 1 each 0  . glucose blood (ONE TOUCH ULTRA TEST) test strip Use to check blood sugar once daily (dx. E11.9) 100 each 1  . metFORMIN (GLUCOPHAGE) 500 MG tablet Take 0.5 tablets (250 mg total) by mouth 2 (two) times daily. 90 tablet 3  . Multiple Vitamin (MULTIVITAMIN WITH MINERALS) TABS tablet Take 1 tablet by mouth daily.    . naproxen (NAPROSYN) 500 MG tablet Take 1 tablet (500 mg total) by mouth 2  (two) times daily as needed. With a meal 60 tablet 0  . ONETOUCH DELICA LANCETS 11B MISC Use to check blood sugar once daily (dx. E11.9) 100 each 1  . oxybutynin (DITROPAN XL) 10 MG 24 hr tablet Take 1 tablet (10 mg total) by mouth at bedtime. 90 tablet 3  . simvastatin (ZOCOR) 20 MG tablet TAKE 1/2 TABLET BY MOUTH EVERY EVENING WITH A LOW FAT SNACK 45 tablet 3  . triamcinolone (KENALOG) 0.1 % paste Use as directed 1 application in the mouth or throat 2 (two) times daily. To affected area 5 g 1   No current facility-administered medications on file prior to visit.    Review of Systems  Constitutional: Negative for chills, fever and malaise/fatigue.  HENT: Negative for congestion, ear pain, sinus pain and sore throat.   Eyes: Negative for blurred vision, discharge and redness.  Respiratory: Negative for cough, shortness of breath and stridor.   Cardiovascular: Negative for chest pain, palpitations and leg swelling.  Gastrointestinal: Negative for abdominal pain, diarrhea, nausea and vomiting.  Musculoskeletal: Negative for myalgias.  Skin: Negative for rash.  Neurological: Positive for dizziness. Negative for tingling, tremors, sensory change, speech change, focal weakness, seizures, loss of consciousness and headaches.       Scalp feels tender-not really a headache     Observations/Objective: Patient appears well, in no distress Weight is baseline (obese) No facial swelling or asymmetry Normal voice-not hoarse and no slurred speech No obvious tremor or mobility impairment Moving neck and UEs normally Able to hear the call well  No cough or shortness of breath during interview  Talkative and mentally sharp with no cognitive changes No skin changes on face or neck , no rash or pallor Affect is mildly anxious (pleasant)   Assessment and Plan: Problem List Items Addressed This Visit      Other   Dizziness    Reviewed pt's urgent care notes/orders/ekg and examination  Suspect  vertigo /inner ear based on nystagmus  Px meclizine to try (hold the oxybutinin) Warned of sedation  May help sleep since symptoms are at night Pending labs from UC If not better in several days inst to make appt here If suddenly severe inst to go to ED           Follow Up Instructions: Try meclizine 25 mg up to three times daily as needed for dizziness It should really help at night  The days you take this-hold your bladder medicine (oxybutinin)  Change position slowly   Start drinking more fluid - 64 oz of fluid per day  Mostly water   Update if not starting to improve in a week or if worsening   I discussed the assessment and treatment plan with  the patient. The patient was provided an opportunity to ask questions and all were answered. The patient agreed with the plan and demonstrated an understanding of the instructions.   The patient was advised to call back or seek an in-person evaluation if the symptoms worsen or if the condition fails to improve as anticipated.     Loura Pardon, MD

## 2018-12-25 NOTE — Assessment & Plan Note (Signed)
Reviewed pt's urgent care notes/orders/ekg and examination  Suspect vertigo /inner ear based on nystagmus  Px meclizine to try (hold the oxybutinin) Warned of sedation  May help sleep since symptoms are at night Pending labs from UC If not better in several days inst to make appt here If suddenly severe inst to go to ED

## 2019-04-02 DIAGNOSIS — Z1231 Encounter for screening mammogram for malignant neoplasm of breast: Secondary | ICD-10-CM | POA: Diagnosis not present

## 2019-04-03 DIAGNOSIS — Z124 Encounter for screening for malignant neoplasm of cervix: Secondary | ICD-10-CM | POA: Diagnosis not present

## 2019-04-04 LAB — HM MAMMOGRAPHY

## 2019-08-27 ENCOUNTER — Other Ambulatory Visit: Payer: Self-pay

## 2019-08-27 ENCOUNTER — Ambulatory Visit (INDEPENDENT_AMBULATORY_CARE_PROVIDER_SITE_OTHER): Payer: Medicare Other | Admitting: Family Medicine

## 2019-08-27 ENCOUNTER — Encounter: Payer: Self-pay | Admitting: Family Medicine

## 2019-08-27 DIAGNOSIS — R202 Paresthesia of skin: Secondary | ICD-10-CM | POA: Diagnosis not present

## 2019-08-27 NOTE — Progress Notes (Signed)
This visit occurred during the SARS-CoV-2 public health emergency.  Safety protocols were in place, including screening questions prior to the visit, additional usage of staff PPE, and extensive cleaning of exam room while observing appropriate contact time as indicated for disinfecting solutions.  R hand tingling and uncomfortable sensation.  Has occ dropped an object.  Having trouble for a few weeks, worse in the last few days.  No L hand sx.  No R leg sx like the R hand.  Tingling on the palm but not on the back of the hand.  "it's like it goes to sleep."  No specific injury or trauma.  No FCNAVD.    Covid vaccination encouraged.  Meds, vitals, and allergies reviewed.   ROS: Per HPI unless specifically indicated in ROS section   nad ncat Neck supple, normal ROM Normal right shoulder elbow range of motion Dec R hand vibration and monofilament on palm side but normal on extensor side.  Slightly dec grip but no tendon failure.  R wrist flexor tinel positive.  Distally vascular intact. Sensation intact on the left hand and right leg.

## 2019-08-27 NOTE — Patient Instructions (Signed)
Get an over the counter wrist splint.  Wear it during the day when possible and at night when sleeping.  Update [place] if that isn't helping.  Take care.  Glad to see you.

## 2019-08-28 DIAGNOSIS — R202 Paresthesia of skin: Secondary | ICD-10-CM | POA: Insufficient documentation

## 2019-08-28 NOTE — Assessment & Plan Note (Signed)
Unilateral with Tinel positive, likely carpal tunnel symptoms.  Discussed getting an over-the-counter wrist brace and using that in the meantime.  She can wear it during the day and she can also sleep in it.  She will update [place] if not better.  She agrees.

## 2019-09-16 ENCOUNTER — Ambulatory Visit (INDEPENDENT_AMBULATORY_CARE_PROVIDER_SITE_OTHER): Payer: Medicare Other | Admitting: Family Medicine

## 2019-09-16 ENCOUNTER — Other Ambulatory Visit: Payer: Self-pay

## 2019-09-16 ENCOUNTER — Encounter: Payer: Self-pay | Admitting: Family Medicine

## 2019-09-16 ENCOUNTER — Telehealth: Payer: Self-pay

## 2019-09-16 VITALS — BP 140/78 | HR 77 | Temp 96.9°F | Ht <= 58 in | Wt 181.4 lb

## 2019-09-16 DIAGNOSIS — R110 Nausea: Secondary | ICD-10-CM | POA: Diagnosis not present

## 2019-09-16 DIAGNOSIS — L03116 Cellulitis of left lower limb: Secondary | ICD-10-CM | POA: Diagnosis not present

## 2019-09-16 DIAGNOSIS — Z6838 Body mass index (BMI) 38.0-38.9, adult: Secondary | ICD-10-CM | POA: Diagnosis not present

## 2019-09-16 DIAGNOSIS — L0390 Cellulitis, unspecified: Secondary | ICD-10-CM | POA: Insufficient documentation

## 2019-09-16 MED ORDER — CEPHALEXIN 500 MG PO CAPS: 500.0000 mg | ORAL_CAPSULE | Freq: Three times a day (TID) | ORAL | 0 refills | Status: DC

## 2019-09-16 NOTE — Assessment & Plan Note (Signed)
Suspect a superficial scrape/wound may have become infected in the pool  Disc care incl elevation /compress/ abx ointment and watching inv area (marked with marker) tx with keflex tid for 7d Update if not starting to improve in a week or if worsening   Red flags for worse infection discussed (see avs) and handout given  inst to avoid pool until this is resolved and avoid scratching it

## 2019-09-16 NOTE — Progress Notes (Signed)
Subjective:    Patient ID: Cindy Whitehead, female    DOB: 06-Apr-1935, 84 y.o.   MRN: [id number]  This visit occurred during the SARS-CoV-2 public health emergency.  Safety protocols were in place, including screening questions prior to the visit, additional usage of staff PPE, and extensive cleaning of exam room while observing appropriate contact time as indicated for disinfecting solutions.    HPI Pt presents with L lower leg swelling and redness  Also nausea that just started   Wt Readings from Last 3 Encounters:  09/16/19 181 lb 7 oz (82.3 kg)  08/27/19 181 lb 9 oz (82.4 kg)  12/13/18 183 lb 2 oz (83.1 kg)   38.25 kg/m  Was in pool Saturday L foot felt tingly  Then her calf started to swell and turn red  Is tender to the touch and sometimes throbs   No trauma  Pt has mild DM2   Some itching  No exp to poison ivy  She has scratched it  Used neosporin  Used ice   No h/o blood clot   She became nauseated upon walking in  Feels better now  A little weak   It is really hot outside   Patient Active Problem List   Diagnosis Date Noted  . Cellulitis 09/16/2019  . Hand paresthesia 08/28/2019  . Dizziness 12/25/2018  . Right-sided chest wall pain 12/13/2018  . Oral aphthous ulcer 07/09/2018  . Tinnitus 12/26/2016  . Urge incontinence 11/24/2015  . Routine general medical examination at a health care facility 05/20/2015  . Family history of colon cancer 05/20/2015  . Chronic back pain 05/20/2015  . Low back pain radiating to right leg 09/02/2014  . Encounter for Medicare annual wellness exam 05/13/2014  . Adjustment disorder with mixed anxiety and depressed mood 10/29/2013  . GERD (gastroesophageal reflux disease) 10/13/2010  . Vitamin D deficiency 04/14/2010  . Osteopenia 05/26/2008  . ESSENTIAL HYPERTENSION, BENIGN 05/21/2008  . Class 2 severe obesity due to excess calories with serious comorbidity and body mass index (BMI) of 39.0 to 39.9 in adult ([place])  08/22/2007  . REACTION, ACUTE STRESS W/EMOTIONAL DSTURB 09/14/2006  . Diabetes type 2, controlled ([place]) 05/11/2006  . Hyperlipidemia associated with type 2 diabetes mellitus ([place]) 05/11/2006  . ALLERGIC RHINITIS 05/11/2006  . ASTHMA 05/11/2006   Past Medical History:  Diagnosis Date  . Allergic rhinitis   . Asthma   . Bone loss    ? osteopenia   . Diabetes mellitus   . Hyperlipidemia   . Kidney stones   . Obesity   . Pes planus   . Stress    cares for mohter who is verbally abusive    Past Surgical History:  Procedure Laterality Date  . KNEE SURGERY     Social History   Tobacco Use  . Smoking status: Never Smoker  . Smokeless tobacco: Never Used  Vaping Use  . Vaping Use: Never used  Substance Use Topics  . Alcohol use: No    Alcohol/week: 0.0 standard drinks  . Drug use: No   Family History  Problem Relation Age of Onset  . Colon cancer Mother    Allergies  Allergen Reactions  . Atorvastatin Other (See Comments)    REACTION: leg pain  . Ace Inhibitors Other (See Comments)    cough   Current Outpatient Medications on File Prior to Visit  Medication Sig Dispense Refill  . Blood Glucose Monitoring Suppl (ONE TOUCH ULTRA MINI) w/Device KIT Use to check  blood sugar once daily (dx. E11.9) 1 each 0  . glucose blood (ONE TOUCH ULTRA TEST) test strip Use to check blood sugar once daily (dx. E11.9) 100 each 1  . metFORMIN (GLUCOPHAGE) 500 MG tablet Take 0.5 tablets (250 mg total) by mouth 2 (two) times daily. 90 tablet 3  . Multiple Vitamin (MULTIVITAMIN WITH MINERALS) TABS tablet Take 1 tablet by mouth daily.    . naproxen (NAPROSYN) 500 MG tablet Take 1 tablet (500 mg total) by mouth 2 (two) times daily as needed. With a meal 60 tablet 0  . ONETOUCH DELICA LANCETS 26Z MISC Use to check blood sugar once daily (dx. E11.9) 100 each 1  . oxybutynin (DITROPAN XL) 10 MG 24 hr tablet Take 1 tablet (10 mg total) by mouth at bedtime. 90 tablet 3  . simvastatin (ZOCOR) 20 MG  tablet TAKE 1/2 TABLET BY MOUTH EVERY EVENING WITH A LOW FAT SNACK 45 tablet 3   No current facility-administered medications on file prior to visit.     Review of Systems  Constitutional: Negative for activity change, appetite change, fatigue, fever and unexpected weight change.  HENT: Negative for congestion, ear pain, rhinorrhea, sinus pressure and sore throat.   Eyes: Negative for pain, redness and visual disturbance.  Respiratory: Negative for cough, shortness of breath and wheezing.   Cardiovascular: Negative for chest pain and palpitations.  Gastrointestinal: Negative for abdominal pain, blood in stool, constipation and diarrhea.       Nausea is gone now  Endocrine: Negative for polydipsia and polyuria.  Genitourinary: Negative for dysuria, frequency and urgency.  Musculoskeletal: Negative for arthralgias, back pain and myalgias.  Skin: Positive for color change. Negative for pallor and rash.  Allergic/Immunologic: Negative for environmental allergies.  Neurological: Negative for dizziness, syncope and headaches.  Hematological: Negative for adenopathy. Does not bruise/bleed easily.  Psychiatric/Behavioral: Negative for decreased concentration and dysphoric mood. The patient is not nervous/anxious.        Objective:   Physical Exam Constitutional:      General: She is not in acute distress.    Appearance: Normal appearance. She is obese. She is not ill-appearing or diaphoretic.  HENT:     Mouth/Throat:     Mouth: Mucous membranes are moist.  Eyes:     General:        Right eye: No discharge.        Left eye: No discharge.     Pupils: Pupils are equal, round, and reactive to light.  Cardiovascular:     Rate and Rhythm: Normal rate and regular rhythm.     Pulses: Normal pulses.  Pulmonary:     Effort: Pulmonary effort is normal. No respiratory distress.     Breath sounds: Normal breath sounds. No wheezing or rales.  Musculoskeletal:     Cervical back: Normal range of  motion.     Right lower leg: No edema.     Left lower leg: Edema present.  Lymphadenopathy:     Cervical: No cervical adenopathy.  Skin:    Comments: Left lateral calf from ankle to below knee-erythematous and warm and swollen Mild tenderness- no mass or fluctuance Some healing scrapes (both legs) and excoriations on L leg  Nl rom leg/foot  No joint involvement   Neurological:     Mental Status: She is alert.     Sensory: No sensory deficit.     Coordination: Coordination normal.  Psychiatric:        Mood and Affect: Mood normal.  Assessment & Plan:   Problem List Items Addressed This Visit      Other   Body mass index (BMI) 38.0-38.9, adult    Obesity may add to pre disposition to leg swelling or infection        Cellulitis - Primary    Suspect a superficial scrape/wound may have become infected in the pool  Disc care incl elevation /compress/ abx ointment and watching inv area (marked with marker) tx with keflex tid for 7d Update if not starting to improve in a week or if worsening   Red flags for worse infection discussed (see avs) and handout given  inst to avoid pool until this is resolved and avoid scratching it      Nausea    Brief episode of nausea in office resolved within a minute  This may be due to heat outdoors or cellulitis Asked her to alert [place] if this returns

## 2019-09-16 NOTE — Telephone Encounter (Signed)
I will see her then  

## 2019-09-16 NOTE — Patient Instructions (Signed)
Elevate you leg every chance you get  Cool or warm compress is fine  Keep clean with soap and water No pool until this is resolved Take keflex as directed  If : Redness goes beyond lines Swelling or pain worsens You get a fever  Nausea returns  Please let me know   Update if not starting to improve in a week or if worsening    You can put antibiotic ointment on the scratched areas

## 2019-09-16 NOTE — Assessment & Plan Note (Signed)
Brief episode of nausea in office resolved within a minute  This may be due to heat outdoors or cellulitis Asked her to alert [place] if this returns

## 2019-09-16 NOTE — Assessment & Plan Note (Signed)
Obesity may add to pre disposition to leg swelling or infection

## 2019-09-16 NOTE — Telephone Encounter (Signed)
Starting on 09/15/19 pt has lt lower leg swelling, redness, itching and feels warm to touch; no pain just slightly uncomfortable.lt foot on and off has numbness. No CP or SOB. Pt does not want to go to UC Or ED. Pt scheduled in office appt with Dr Glori Bickers today at 2 PM. Pt to be at office at 1:45 to get cked in. Pt has no covid symptoms, no travel and no known exposure to + covid. UC & ED precautions given and pt voiced understanding.

## 2019-09-17 NOTE — Telephone Encounter (Signed)
Per chart review tab pt was seen on 09/16/19.

## 2019-09-18 ENCOUNTER — Telehealth: Payer: Self-pay

## 2019-09-18 NOTE — Telephone Encounter (Signed)
Pt said the swelling and redness have improved. The redness is almost resolved and the swelling has improved a lot. Pt said she was advised to start taking meds with food and that might be the issue. Pt said she doesn't have an appetite so she hasn't really been eating and maybe that's what's causing some if her GI issues with taking the abx. I did advised pt that she should take the abx with food and that may help her GI sxs also. Pt said she has started eating something before taking meds today even if she's not hungry but wanted to see about cutting back the dose

## 2019-09-18 NOTE — Telephone Encounter (Signed)
Try eating before each dose three times daily.  If that does not work- drop down to twice daily (would rather avoid if possible)  If still not tolerated may need to change abx Glad she is improving!

## 2019-09-18 NOTE — Telephone Encounter (Signed)
Pt left v/m that pt was seen on 09/16/19 and started on cephalexin 500 mg caps taking tid with food; med makes pt nauseated and pt wants to know if could cut back and take bid with food instead. Pt request cb.

## 2019-09-18 NOTE — Telephone Encounter (Signed)
How are her symptoms?   (leg redness/swelling)- any improvement ?

## 2019-09-18 NOTE — Telephone Encounter (Signed)
Pt notified of Dr. Marliss Coots instructions and she verbalized understanding she will keep [place] posted

## 2019-10-16 ENCOUNTER — Other Ambulatory Visit: Payer: Self-pay | Admitting: Family Medicine

## 2019-10-31 ENCOUNTER — Ambulatory Visit: Payer: Medicare Other

## 2019-11-01 ENCOUNTER — Encounter: Payer: Medicare Other | Admitting: Family Medicine

## 2019-11-01 ENCOUNTER — Ambulatory Visit (INDEPENDENT_AMBULATORY_CARE_PROVIDER_SITE_OTHER): Payer: Medicare Other

## 2019-11-01 ENCOUNTER — Other Ambulatory Visit: Payer: Self-pay

## 2019-11-01 DIAGNOSIS — Z0000 Encounter for general adult medical examination without abnormal findings: Secondary | ICD-10-CM

## 2019-11-01 NOTE — Progress Notes (Signed)
PCP notes:  Health Maintenance: Flu- due Shingrix #2 due Mammogram- due, has been notified this is due and will call to set up appointment  Dexa- due, has been notified this is due and will call to set up appointment     Abnormal Screenings: none   Patient concerns: none   Nurse concerns: none   Next PCP appt: 11/18/2019 @ 10:30 am
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
## 2019-11-01 NOTE — Patient Instructions (Signed)
Cindy Whitehead , Thank you for taking time to come for your Medicare Wellness Visit. I appreciate your ongoing commitment to your health goals. Please review the following plan we discussed and let me know if I can assist you in the future.   Screening recommendations/referrals: Colonoscopy: no longer required Mammogram: due, has been notified this is due and will call to set up appointment  Bone Density: due, has been notified this is due and will call to set up appointment  Recommended yearly ophthalmology/optometry visit for glaucoma screening and checkup Recommended yearly dental visit for hygiene and checkup  Vaccinations: Influenza vaccine: due, will get at physical next month  Pneumococcal vaccine: Completed series Tdap vaccine: Up to date, completed 10/17/2016, due 10/2026 Shingles vaccine: #2 due, will have this completed at pharmacy   Covid-19:Completed series  Advanced directives: Advance directive discussed with you today. Even though you declined this today please call our office should you change your mind and we can give you the proper paperwork for you to fill out.  Conditions/risks identified: diabetes  Next appointment: Follow up in one year for your annual wellness visit    Preventive Care 37 Years and Older, Female Preventive care refers to lifestyle choices and visits with your health care provider that can promote health and wellness. What does preventive care include?  A yearly physical exam. This is also called an annual well check.  Dental exams once or twice a year.  Routine eye exams. Ask your health care provider how often you should have your eyes checked.  Personal lifestyle choices, including:  Daily care of your teeth and gums.  Regular physical activity.  Eating a healthy diet.  Avoiding tobacco and drug use.  Limiting alcohol use.  Practicing safe sex.  Taking low-dose aspirin every day.  Taking vitamin and mineral supplements as recommended  by your health care provider. What happens during an annual well check? The services and screenings done by your health care provider during your annual well check will depend on your age, overall health, lifestyle risk factors, and family history of disease. Counseling  Your health care provider may ask you questions about your:  Alcohol use.  Tobacco use.  Drug use.  Emotional well-being.  Home and relationship well-being.  Sexual activity.  Eating habits.  History of falls.  Memory and ability to understand (cognition).  Work and work Statistician.  Reproductive health. Screening  You may have the following tests or measurements:  Height, weight, and BMI.  Blood pressure.  Lipid and cholesterol levels. These may be checked every 5 years, or more frequently if you are over 37 years old.  Skin check.  Lung cancer screening. You may have this screening every year starting at age 34 if you have a 30-pack-year history of smoking and currently smoke or have quit within the past 15 years.  Fecal occult blood test (FOBT) of the stool. You may have this test every year starting at age 17.  Flexible sigmoidoscopy or colonoscopy. You may have a sigmoidoscopy every 5 years or a colonoscopy every 10 years starting at age 27.  Hepatitis C blood test.  Hepatitis B blood test.  Sexually transmitted disease (STD) testing.  Diabetes screening. This is done by checking your blood sugar (glucose) after you have not eaten for a while (fasting). You may have this done every 1-3 years.  Bone density scan. This is done to screen for osteoporosis. You may have this done starting at age 77.  Mammogram.  This may be done every 1-2 years. Talk to your health care provider about how often you should have regular mammograms. Talk with your health care provider about your test results, treatment options, and if necessary, the need for more tests. Vaccines  Your health care provider may  recommend certain vaccines, such as:  Influenza vaccine. This is recommended every year.  Tetanus, diphtheria, and acellular pertussis (Tdap, Td) vaccine. You may need a Td booster every 10 years.  Zoster vaccine. You may need this after age 7.  Pneumococcal 13-valent conjugate (PCV13) vaccine. One dose is recommended after age 37.  Pneumococcal polysaccharide (PPSV23) vaccine. One dose is recommended after age 56. Talk to your health care provider about which screenings and vaccines you need and how often you need them. This information is not intended to replace advice given to you by your health care provider. Make sure you discuss any questions you have with your health care provider. Document Released: 03/20/2015 Document Revised: 11/11/2015 Document Reviewed: 12/23/2014 Elsevier Interactive Patient Education  2017 [employer] Prevention in the Home Falls can cause injuries. They can happen to people of all ages. There are many things you can do to make your home safe and to help prevent falls. What can I do on the outside of my home?  Regularly fix the edges of walkways and driveways and fix any cracks.  Remove anything that might make you trip as you walk through a door, such as a raised step or threshold.  Trim any bushes or trees on the path to your home.  Use bright outdoor lighting.  Clear any walking paths of anything that might make someone trip, such as rocks or tools.  Regularly check to see if handrails are loose or broken. Make sure that both sides of any steps have handrails.  Any raised decks and porches should have guardrails on the edges.  Have any leaves, snow, or ice cleared regularly.  Use sand or salt on walking paths during winter.  Clean up any spills in your garage right away. This includes oil or grease spills. What can I do in the bathroom?  Use night lights.  Install grab bars by the toilet and in the tub and shower. Do not use towel  bars as grab bars.  Use non-skid mats or decals in the tub or shower.  If you need to sit down in the shower, use a plastic, non-slip stool.  Keep the floor dry. Clean up any water that spills on the floor as soon as it happens.  Remove soap buildup in the tub or shower regularly.  Attach bath mats securely with double-sided non-slip rug tape.  Do not have throw rugs and other things on the floor that can make you trip. What can I do in the bedroom?  Use night lights.  Make sure that you have a light by your bed that is easy to reach.  Do not use any sheets or blankets that are too big for your bed. They should not hang down onto the floor.  Have a firm chair that has side arms. You can use this for support while you get dressed.  Do not have throw rugs and other things on the floor that can make you trip. What can I do in the kitchen?  Clean up any spills right away.  Avoid walking on wet floors.  Keep items that you use a lot in easy-to-reach places.  If you need to reach something  above you, use a strong step stool that has a grab bar.  Keep electrical cords out of the way.  Do not use floor polish or wax that makes floors slippery. If you must use wax, use non-skid floor wax.  Do not have throw rugs and other things on the floor that can make you trip. What can I do with my stairs?  Do not leave any items on the stairs.  Make sure that there are handrails on both sides of the stairs and use them. Fix handrails that are broken or loose. Make sure that handrails are as long as the stairways.  Check any carpeting to make sure that it is firmly attached to the stairs. Fix any carpet that is loose or worn.  Avoid having throw rugs at the top or bottom of the stairs. If you do have throw rugs, attach them to the floor with carpet tape.  Make sure that you have a light switch at the top of the stairs and the bottom of the stairs. If you do not have them, ask someone to  add them for you. What else can I do to help prevent falls?  Wear shoes that:  Do not have high heels.  Have rubber bottoms.  Are comfortable and fit you well.  Are closed at the toe. Do not wear sandals.  If you use a stepladder:  Make sure that it is fully opened. Do not climb a closed stepladder.  Make sure that both sides of the stepladder are locked into place.  Ask someone to hold it for you, if possible.  Clearly mark and make sure that you can see:  Any grab bars or handrails.  First and last steps.  Where the edge of each step is.  Use tools that help you move around (mobility aids) if they are needed. These include:  Canes.  Walkers.  Scooters.  Crutches.  Turn on the lights when you go into a dark area. Replace any light bulbs as soon as they burn out.  Set up your furniture so you have a clear path. Avoid moving your furniture around.  If any of your floors are uneven, fix them.  If there are any pets around you, be aware of where they are.  Review your medicines with your doctor. Some medicines can make you feel dizzy. This can increase your chance of falling. Ask your doctor what other things that you can do to help prevent falls. This information is not intended to replace advice given to you by your health care provider. Make sure you discuss any questions you have with your health care provider. Document Released: 12/18/2008 Document Revised: 07/30/2015 Document Reviewed: 03/28/2014 Elsevier Interactive Patient Education  2017 [employer].

## 2019-11-18 ENCOUNTER — Encounter: Payer: Self-pay | Admitting: Family Medicine

## 2019-11-18 ENCOUNTER — Ambulatory Visit (INDEPENDENT_AMBULATORY_CARE_PROVIDER_SITE_OTHER): Payer: Medicare Other | Admitting: Family Medicine

## 2019-11-18 ENCOUNTER — Other Ambulatory Visit: Payer: Self-pay

## 2019-11-18 VITALS — BP 134/80 | HR 79 | Temp 96.9°F | Ht <= 58 in | Wt 181.3 lb

## 2019-11-18 DIAGNOSIS — I10 Essential (primary) hypertension: Secondary | ICD-10-CM

## 2019-11-18 DIAGNOSIS — G8929 Other chronic pain: Secondary | ICD-10-CM

## 2019-11-18 DIAGNOSIS — E559 Vitamin D deficiency, unspecified: Secondary | ICD-10-CM | POA: Diagnosis not present

## 2019-11-18 DIAGNOSIS — E1169 Type 2 diabetes mellitus with other specified complication: Secondary | ICD-10-CM | POA: Diagnosis not present

## 2019-11-18 DIAGNOSIS — E785 Hyperlipidemia, unspecified: Secondary | ICD-10-CM

## 2019-11-18 DIAGNOSIS — E119 Type 2 diabetes mellitus without complications: Secondary | ICD-10-CM | POA: Diagnosis not present

## 2019-11-18 DIAGNOSIS — Z23 Encounter for immunization: Secondary | ICD-10-CM

## 2019-11-18 DIAGNOSIS — Z6838 Body mass index (BMI) 38.0-38.9, adult: Secondary | ICD-10-CM

## 2019-11-18 DIAGNOSIS — M8589 Other specified disorders of bone density and structure, multiple sites: Secondary | ICD-10-CM

## 2019-11-18 DIAGNOSIS — M5441 Lumbago with sciatica, right side: Secondary | ICD-10-CM

## 2019-11-18 DIAGNOSIS — Z0000 Encounter for general adult medical examination without abnormal findings: Secondary | ICD-10-CM

## 2019-11-18 LAB — LIPID PANEL
Cholesterol: 129 mg/dL (ref 0–200)
HDL: 45.9 mg/dL (ref >39.00)
LDL Cholesterol: 57 mg/dL (ref 0–99)
NonHDL: 82.64
Total CHOL/HDL Ratio: 3
Triglycerides: 127 mg/dL (ref 0.0–149.0)
VLDL: 25.4 mg/dL (ref 0.0–40.0)

## 2019-11-18 LAB — CBC WITH DIFFERENTIAL/PLATELET
Basophils Absolute: 0.1 10*3/uL (ref 0.0–0.1)
Basophils Relative: 1 % (ref 0.0–3.0)
Eosinophils Absolute: 0.2 10*3/uL (ref 0.0–0.7)
Eosinophils Relative: 3 % (ref 0.0–5.0)
HCT: 41.4 % (ref 36.0–46.0)
Hemoglobin: 14 g/dL (ref 12.0–15.0)
Lymphocytes Relative: 26.1 % (ref 12.0–46.0)
Lymphs Abs: 1.7 10*3/uL (ref 0.7–4.0)
MCHC: 33.8 g/dL (ref 30.0–36.0)
MCV: 95.9 fl (ref 78.0–100.0)
Monocytes Absolute: 0.4 10*3/uL (ref 0.1–1.0)
Monocytes Relative: 5.9 % (ref 3.0–12.0)
Neutro Abs: 4.1 10*3/uL (ref 1.4–7.7)
Neutrophils Relative %: 64 % (ref 43.0–77.0)
Platelets: 193 10*3/uL (ref 150.0–400.0)
RBC: 4.32 Mil/uL (ref 3.87–5.11)
RDW: 12.7 % (ref 11.5–15.5)
WBC: 6.4 10*3/uL (ref 4.0–10.5)

## 2019-11-18 LAB — COMPREHENSIVE METABOLIC PANEL
ALT: 20 U/L (ref 0–35)
AST: 26 U/L (ref 0–37)
Albumin: 4 g/dL (ref 3.5–5.2)
Alkaline Phosphatase: 77 U/L (ref 39–117)
BUN: 16 mg/dL (ref 6–23)
CO2: 25 mEq/L (ref 19–32)
Calcium: 8.9 mg/dL (ref 8.4–10.5)
Chloride: 107 mEq/L (ref 96–112)
Creatinine, Ser: 0.81 mg/dL (ref 0.40–1.20)
GFR: 67.68 mL/min (ref >60.00)
Glucose, Bld: 104 mg/dL — ABNORMAL HIGH (ref 70–99)
Potassium: 4.4 mEq/L (ref 3.5–5.1)
Sodium: 142 mEq/L (ref 135–145)
Total Bilirubin: 0.5 mg/dL (ref 0.2–1.2)
Total Protein: 6.5 g/dL (ref 6.0–8.3)

## 2019-11-18 LAB — TSH: TSH: 3.04 u[IU]/mL (ref 0.35–4.50)

## 2019-11-18 LAB — VITAMIN D 25 HYDROXY (VIT D DEFICIENCY, FRACTURES): VITD: 21.76 ng/mL — ABNORMAL LOW (ref 30.00–100.00)

## 2019-11-18 LAB — HEMOGLOBIN A1C: Hgb A1c MFr Bld: 6.1 % (ref 4.6–6.5)

## 2019-11-18 MED ORDER — METFORMIN HCL 500 MG PO TABS: 250.0000 mg | ORAL_TABLET | Freq: Two times a day (BID) | ORAL | 3 refills | Status: DC

## 2019-11-18 MED ORDER — SIMVASTATIN 20 MG PO TABS: ORAL_TABLET | ORAL | 3 refills | Status: DC

## 2019-11-18 MED ORDER — NAPROXEN 500 MG PO TABS: 500.0000 mg | ORAL_TABLET | Freq: Two times a day (BID) | ORAL | 0 refills | Status: DC | PRN

## 2019-11-18 MED ORDER — OXYBUTYNIN CHLORIDE ER 10 MG PO TB24: ORAL_TABLET | ORAL | 3 refills | Status: DC

## 2019-11-18 NOTE — Assessment & Plan Note (Signed)
Reviewed health habits including diet and exercise and skin cancer prevention Reviewed appropriate screening tests for age  Also reviewed health mt list, fam hx and immunization status , as well as social and family history   See HPI amw rev Pt plans to get 2nd shingrix in pharmacy Flu shot given today  covid immunized  Will send for last dexa and mammogram from gyn (she thinks utd)  Labs drawn today

## 2019-11-18 NOTE — Assessment & Plan Note (Signed)
Takes occ naprosyn Takes with food Watches for GI side eff

## 2019-11-18 NOTE — Progress Notes (Signed)
Subjective:    Patient ID: Cindy Whitehead, female    DOB: 14-Jul-1935, 84 y.o.   MRN: [id number]  This visit occurred during the SARS-CoV-2 public health emergency.  Safety protocols were in place, including screening questions prior to the visit, additional usage of staff PPE, and extensive cleaning of exam room while observing appropriate contact time as indicated for disinfecting solutions.    HPI Here for health maintenance exam and to review chronic medical problems    Wt Readings from Last 3 Encounters:  11/18/19 181 lb 5 oz (82.2 kg)  09/16/19 181 lb 7 oz (82.3 kg)  08/27/19 181 lb 9 oz (82.4 kg)   38.56 kg/m   Had amw on 8/27 Noted due for 2nd shingrix  Also mammogram and dexa   She is covid immunized (July)  Mammogram - last summer at gyn office Self breast exam - no lumps   Some hard/crusty moles   dexa 6/17 osteopenia (Dr Verlon Au office)  Falls - none  fx-none Supplements-taking her ca and D   (out for a week-going to get it today) H/o D def Exercise - goes for walks daily   Colonoscopy 7/19  Her mother had colon cancer    Flu shot -today   Eye exam 2/21  HTN bp is stable today  No cp or palpitations or headaches or edema  No side effects to medicines  BP Readings from Last 3 Encounters:  11/18/19 134/80  09/16/19 140/78  08/27/19 136/82     Pulse Readings from Last 3 Encounters:  11/18/19 79  09/16/19 77  08/27/19 84   Due for labs   DM2 with low dose metformin  She has had more diarrhea - urgency after bm   250 bid  occ has to take pepto  No blood in stool  Has been well controlled  Taking simvastatin for cholesterol   Lab Results  Component Value Date   CHOL 159 10/25/2018   HDL 47.30 10/25/2018   LDLCALC 87 10/25/2018   LDLDIRECT 94.0 11/11/2014   TRIG 122.0 10/25/2018   CHOLHDL 3 10/25/2018   Patient Active Problem List   Diagnosis Date Noted  . Hand paresthesia 08/28/2019  . Dizziness 12/25/2018  . Right-sided chest wall  pain 12/13/2018  . Oral aphthous ulcer 07/09/2018  . Tinnitus 12/26/2016  . Urge incontinence 11/24/2015  . Routine general medical examination at a health care facility 05/20/2015  . Family history of colon cancer 05/20/2015  . Chronic back pain 05/20/2015  . Low back pain radiating to right leg 09/02/2014  . Encounter for Medicare annual wellness exam 05/13/2014  . Adjustment disorder with mixed anxiety and depressed mood 10/29/2013  . GERD (gastroesophageal reflux disease) 10/13/2010  . Vitamin D deficiency 04/14/2010  . Osteopenia 05/26/2008  . ESSENTIAL HYPERTENSION, BENIGN 05/21/2008  . Body mass index (BMI) 38.0-38.9, adult 08/22/2007  . REACTION, ACUTE STRESS W/EMOTIONAL DSTURB 09/14/2006  . Diabetes type 2, controlled ([place]) 05/11/2006  . Hyperlipidemia associated with type 2 diabetes mellitus ([place]) 05/11/2006  . ALLERGIC RHINITIS 05/11/2006  . ASTHMA 05/11/2006   Past Medical History:  Diagnosis Date  . Allergic rhinitis   . Asthma   . Bone loss    ? osteopenia   . Diabetes mellitus   . Hyperlipidemia   . Kidney stones   . Obesity   . Pes planus   . Stress    cares for mohter who is verbally abusive    Past Surgical History:  Procedure Laterality Date  .  KNEE SURGERY     Social History   Tobacco Use  . Smoking status: Never Smoker  . Smokeless tobacco: Never Used  Vaping Use  . Vaping Use: Never used  Substance Use Topics  . Alcohol use: No    Alcohol/week: 0.0 standard drinks  . Drug use: No   Family History  Problem Relation Age of Onset  . Colon cancer Mother    Allergies  Allergen Reactions  . Atorvastatin Other (See Comments)    REACTION: leg pain  . Ace Inhibitors Other (See Comments)    cough   Current Outpatient Medications on File Prior to Visit  Medication Sig Dispense Refill  . Blood Glucose Monitoring Suppl (ONE TOUCH ULTRA MINI) w/Device KIT Use to check blood sugar once daily (dx. E11.9) 1 each 0  . glucose blood (ONE TOUCH ULTRA  TEST) test strip Use to check blood sugar once daily (dx. E11.9) 100 each 1  . Multiple Vitamin (MULTIVITAMIN WITH MINERALS) TABS tablet Take 1 tablet by mouth daily.    Glory Rosebush DELICA LANCETS 93A MISC Use to check blood sugar once daily (dx. E11.9) 100 each 1   No current facility-administered medications on file prior to visit.      Review of Systems  Constitutional: Negative for activity change, appetite change, fatigue, fever and unexpected weight change.  HENT: Negative for congestion, ear pain, rhinorrhea, sinus pressure and sore throat.   Eyes: Negative for pain, redness and visual disturbance.  Respiratory: Negative for cough, shortness of breath and wheezing.   Cardiovascular: Negative for chest pain and palpitations.  Gastrointestinal: Negative for abdominal pain, blood in stool, constipation and diarrhea.  Endocrine: Negative for polydipsia and polyuria.  Genitourinary: Negative for dysuria, frequency and urgency.  Musculoskeletal: Negative for arthralgias, back pain and myalgias.  Skin: Negative for pallor and rash.  Allergic/Immunologic: Negative for environmental allergies.  Neurological: Negative for dizziness, syncope and headaches.  Hematological: Negative for adenopathy. Does not bruise/bleed easily.  Psychiatric/Behavioral: Negative for decreased concentration and dysphoric mood. The patient is not nervous/anxious.        Objective:   Physical Exam Constitutional:      General: She is not in acute distress.    Appearance: Normal appearance. She is well-developed. She is obese. She is not ill-appearing or diaphoretic.  HENT:     Head: Normocephalic and atraumatic.     Right Ear: Tympanic membrane, ear canal and external ear normal.     Left Ear: Tympanic membrane, ear canal and external ear normal.     Nose: Nose normal. No congestion.     Mouth/Throat:     Mouth: Mucous membranes are moist.     Pharynx: Oropharynx is clear. No posterior oropharyngeal  erythema.  Eyes:     General: No scleral icterus.    Extraocular Movements: Extraocular movements intact.     Conjunctiva/sclera: Conjunctivae normal.     Pupils: Pupils are equal, round, and reactive to light.  Neck:     Thyroid: No thyromegaly.     Vascular: No carotid bruit or JVD.  Cardiovascular:     Rate and Rhythm: Normal rate and regular rhythm.     Pulses: Normal pulses.     Heart sounds: Normal heart sounds. No gallop.   Pulmonary:     Effort: Pulmonary effort is normal. No respiratory distress.     Breath sounds: Normal breath sounds. No wheezing.     Comments: Good air exch Chest:     Chest wall: No  tenderness.  Abdominal:     General: Bowel sounds are normal. There is no distension or abdominal bruit.     Palpations: Abdomen is soft. There is no mass.     Tenderness: There is no abdominal tenderness.     Hernia: No hernia is present.  Genitourinary:    Comments: Breast exam: No mass, nodules, thickening, tenderness, bulging, retraction, inflamation, nipple discharge or skin changes noted.  No axillary or clavicular LA.     Musculoskeletal:        General: No tenderness. Normal range of motion.     Cervical back: Normal range of motion and neck supple. No rigidity. No muscular tenderness.     Right lower leg: No edema.     Left lower leg: No edema.     Comments: No kyphosis   Lymphadenopathy:     Cervical: No cervical adenopathy.  Skin:    General: Skin is warm and dry.     Coloration: Skin is not pale.     Findings: No erythema or rash.     Comments: Several skin tags and sks on trunk  Neurological:     Mental Status: She is alert. Mental status is at baseline.     Cranial Nerves: No cranial nerve deficit.     Motor: No abnormal muscle tone.     Coordination: Coordination normal.     Gait: Gait normal.     Deep Tendon Reflexes: Reflexes are normal and symmetric. Reflexes normal.  Psychiatric:        Mood and Affect: Mood normal.        Cognition and  Memory: Cognition and memory normal.           Assessment & Plan:   Problem List Items Addressed This Visit      Cardiovascular and Mediastinum   ESSENTIAL HYPERTENSION, BENIGN    bp in fair control at this time  BP Readings from Last 1 Encounters:  11/18/19 134/80   No changes needed Most recent labs reviewed  Disc lifstyle change with low sodium diet and exercise  Labs today      Relevant Medications   simvastatin (ZOCOR) 20 MG tablet   Other Relevant Orders   CBC with Differential/Platelet   Comprehensive metabolic panel   Lipid panel   TSH     Endocrine   Diabetes type 2, controlled ([place])    A1C today  Doing well  ? If diarrhea from metformin-is tolerable Disc need to cut sweets and candy Nl foot exam utd eye care  Needs to work on weight loss Flu shot given      Relevant Medications   metFORMIN (GLUCOPHAGE) 500 MG tablet   simvastatin (ZOCOR) 20 MG tablet   Other Relevant Orders   Hemoglobin A1c   Hyperlipidemia associated with type 2 diabetes mellitus ([place])    Labs today  Simvastatin and diet  Also diabetic Disc goals for lipids and reasons to control them Rev last labs with pt Rev low sat fat diet in detail       Relevant Medications   metFORMIN (GLUCOPHAGE) 500 MG tablet   simvastatin (ZOCOR) 20 MG tablet   Other Relevant Orders   Lipid panel     Musculoskeletal and Integument   Osteopenia    Managed by gyn  Unsure when last dexa was-she will sign release to get report  No falls/fx Off ca/D for a week  D level today Enc exercise  Other   Body mass index (BMI) 38.0-38.9, adult    Discussed how this problem influences overall health and the risks it imposes  Reviewed plan for weight loss with lower calorie diet (via better food choices and also portion control or program like weight watchers) and exercise building up to or more than 30 minutes 5 days per week including some aerobic activity         Vitamin D deficiency      Level today  Has osteopenia  Missed for a week-needs to pick up her supplement      Relevant Orders   VITAMIN D 25 Hydroxy (Vit-D Deficiency, Fractures)   Routine general medical examination at a health care facility - Primary    Reviewed health habits including diet and exercise and skin cancer prevention Reviewed appropriate screening tests for age  Also reviewed health mt list, fam hx and immunization status , as well as social and family history   See HPI amw rev Pt plans to get 2nd shingrix in pharmacy Flu shot given today  covid immunized  Will send for last dexa and mammogram from gyn (she thinks utd)  Labs drawn today       Chronic back pain    Takes occ naprosyn Takes with food Watches for GI side eff      Relevant Medications   naproxen (NAPROSYN) 500 MG tablet

## 2019-11-18 NOTE — Assessment & Plan Note (Signed)
Discussed how this problem influences overall health and the risks it imposes  Reviewed plan for weight loss with lower calorie diet (via better food choices and also portion control or program like weight watchers) and exercise building up to or more than 30 minutes 5 days per week including some aerobic activity    

## 2019-11-18 NOTE — Assessment & Plan Note (Signed)
bp in fair control at this time  BP Readings from Last 1 Encounters:  11/18/19 134/80   No changes needed Most recent labs reviewed  Disc lifstyle change with low sodium diet and exercise  Labs today

## 2019-11-18 NOTE — Assessment & Plan Note (Signed)
Level today  Has osteopenia  Missed for a week-needs to pick up her supplement

## 2019-11-18 NOTE — Assessment & Plan Note (Signed)
Managed by gyn  Unsure when last dexa was-she will sign release to get report  No falls/fx Off ca/D for a week  D level today Enc exercise

## 2019-11-18 NOTE — Assessment & Plan Note (Signed)
Labs today  Simvastatin and diet  Also diabetic Disc goals for lipids and reasons to control them Rev last labs with pt Rev low sat fat diet in detail

## 2019-11-18 NOTE — Assessment & Plan Note (Signed)
A1C today  Doing well  ? If diarrhea from metformin-is tolerable Disc need to cut sweets and candy Nl foot exam utd eye care  Needs to work on weight loss Flu shot given

## 2019-11-18 NOTE — Patient Instructions (Addendum)
You are overdue for the 2nd shingles vaccine (shingrix) Call your pharmacy to set that up   Flu shot today    Do follow up with gyn for your yearly visit and mammogram and bone density test if needed   Labs today   Take care of yourself  Keep walking Stay safe

## 2019-12-12 ENCOUNTER — Telehealth: Payer: Self-pay

## 2019-12-12 NOTE — Telephone Encounter (Signed)
Left VM letting daughter know letter ready for pick up 

## 2019-12-12 NOTE — Telephone Encounter (Signed)
Pt's daughter called and said pt has lost her social security card and in order for her to get another one,  the Social Security office needs a patient information sheet that says the patient's name, dob, and provider. It has to be signed by her provider on our letterhead. I am unsure of who would do this for patient? Please advise.

## 2019-12-12 NOTE — Telephone Encounter (Signed)
Printed and in IN box  

## 2019-12-18 ENCOUNTER — Other Ambulatory Visit: Payer: Self-pay | Admitting: Family Medicine

## 2019-12-18 NOTE — Telephone Encounter (Signed)
Last filled at [facility] on 11/18/19 #60 tabs with 0 refills, 6 month f/u scheduled for 05/18/20, please advise

## 2020-03-02 ENCOUNTER — Telehealth: Payer: Self-pay

## 2020-03-02 NOTE — Telephone Encounter (Signed)
I tried calling pt and unable to reach pt by any contact #. I spoke with Alvis Lemmings (DPR signed).  Pt is not SOB, pts daughter said pt is not having any SOB or CP. Pt is not feeling weak; Dawn thinks pt just got upset when pt was advised by access nurse to go to ED. Dawn was with her mom and if pt develops high fever, SOB,CP, or any difficulty breathing Dawn will take pt to ED and Dawn will cb in one to two days with update on how pt is doing. FYI to Dr Milinda Antis.

## 2020-03-02 NOTE — Telephone Encounter (Signed)
Aware, please check on her in the next several days

## 2020-03-02 NOTE — Telephone Encounter (Signed)
[facility] Day - Client TELEPHONE ADVICE RECORD AccessNurse Patient Name: Cindy Whitehead Gender: Female DOB: [birth year] Age: 84 Y 4 M 3 D Return Phone Number: [phone] (Primary), [phone] (Secondary) Address: [place] [address] Client [facility] Day - Client Client Site [facility] - Day Physician Milinda Antis, Idamae Schuller - MD Contact Type Call Who Is Calling Patient / Member / Family / Caregiver Call Type Triage / Clinical Relationship To Patient Self Return Phone Number [phone] (Primary) Chief Complaint Weakness, Generalized Reason for Call Symptomatic / Request for Health Information Initial Comment Caller states she is covid positive and needs to know what she needs to do. She is coughing and has a stuffy head. She is also feeling really weak. Translation No Nurse Assessment Nurse: Letitia Libra, RN, Shaun Date/Time (Eastern Time): 03/02/2020 10:13:53 AM Confirm and document reason for call. If symptomatic, describe symptoms. ---Caller states she is Covid positive with home test and needs to know what she needs to do. She is coughing and has a stuffy head. She is also feeling really weak. No fever. No other symptoms. Does the patient have any new or worsening symptoms? ---Yes Will a triage be completed? ---Yes Related visit to physician within the last 2 weeks? ---No Does the PT have any chronic conditions? (i.e. diabetes, asthma, this includes High risk factors for pregnancy, etc.) ---Yes List chronic conditions. ---Diabetic Is this a behavioral health or substance abuse call? ---No Guidelines Guideline Title Affirmed Question Affirmed Notes Nurse Date/Time (Eastern Time) COVID-19 - Diagnosed or Suspected SEVERE or constant chest pain or pressure (Exception: mild central chest pain, present only when coughing) Letitia Libra, RN, Shaun 03/02/2020 10:16:19 AM Disp. Time Lamount Cohen Time)  Disposition Final User 03/02/2020 10:20:02 AM Go to ED Now Yes Letitia Libra, RN, Shaun PLEASE NOTE: All timestamps contained within this report are represented as [place] Standard Time. CONFIDENTIALTY NOTICE: This fax transmission is intended only for the addressee. It contains information that is legally privileged, confidential or otherwise protected from use or disclosure. If you are not the intended recipient, you are strictly prohibited from reviewing, disclosing, copying using or disseminating any of this information or taking any action in reliance on or regarding this information. If you have received this fax in error, please notify us immediately by telephone so that we can arrange for its return to [place]. Phone: [phone], Toll-Free: [phone], Fax: [phone] Page: 2 of 2 Call Id: [id number] Caller Disagree/Comply Comply Caller Understands Yes PreDisposition InappropriateToAsk Care Advice Given Per Guideline GO TO ED NOW: * You need to be seen in the Emergency Department. * Go to the ED at ___________ Hospital. * Leave now. Drive carefully. ANOTHER ADULT SHOULD DRIVE: * It is better and safer if another adult drives instead of you. CARE ADVICE given per COVID-19 - DIAGNOSED OR SUSPECTED (Adult) guideline. CALL EMS 911 IF: * Severe difficulty breathing occurs * Confusion occurs. * Lips or face turns blue Comments User: Dolores Patty, RN Date/Time Lamount Cohen Time): 03/02/2020 10:20:37 AM Described chest pressure as something sitting on her chest. Referrals GO TO FACILITY UNDECIDED

## 2020-03-10 NOTE — Telephone Encounter (Signed)
Called to check in on pt. She said she never went to [facility] or ER because Dawn didn't want her to she wanted her to stay away from "sick ppl". Pt said she is starting to feel a little better. She is still weak and has leg pains, pt also still has a HA off and on but for the most part she is improving. Pt advise if she wanted to schedule virtual visit to let us know. Pt declined appt right now she said since she is feeling better she will give it a few more days, I advise pt if sxs don't continue to improve or she develops any new sxs or worsening of sxs to call us back, pt verbalized understanding

## 2020-03-20 ENCOUNTER — Other Ambulatory Visit: Payer: Self-pay | Admitting: Family Medicine

## 2020-03-20 ENCOUNTER — Telehealth: Payer: Self-pay | Admitting: *Deleted

## 2020-03-20 ENCOUNTER — Telehealth (INDEPENDENT_AMBULATORY_CARE_PROVIDER_SITE_OTHER): Payer: Medicare Other | Admitting: Family Medicine

## 2020-03-20 ENCOUNTER — Other Ambulatory Visit: Payer: Self-pay

## 2020-03-20 ENCOUNTER — Ambulatory Visit
Admission: RE | Admit: 2020-03-20 | Discharge: 2020-03-20 | Disposition: A | Discharge status: Home / Self Care | Payer: Medicare Other | Source: Ambulatory Visit | Attending: Family Medicine | Admitting: Family Medicine

## 2020-03-20 ENCOUNTER — Encounter: Payer: Self-pay | Admitting: Family Medicine

## 2020-03-20 ENCOUNTER — Ambulatory Visit
Admission: RE | Admit: 2020-03-20 | Discharge: 2020-03-20 | Disposition: A | Discharge status: Home / Self Care | Payer: Medicare Other | Attending: Family Medicine | Admitting: Family Medicine

## 2020-03-20 DIAGNOSIS — U071 COVID-19: Secondary | ICD-10-CM

## 2020-03-20 DIAGNOSIS — R531 Weakness: Secondary | ICD-10-CM | POA: Diagnosis not present

## 2020-03-20 DIAGNOSIS — K8020 Calculus of gallbladder without cholecystitis without obstruction: Secondary | ICD-10-CM | POA: Diagnosis not present

## 2020-03-20 DIAGNOSIS — R0789 Other chest pain: Secondary | ICD-10-CM | POA: Diagnosis not present

## 2020-03-20 DIAGNOSIS — I700 Atherosclerosis of aorta: Secondary | ICD-10-CM | POA: Diagnosis not present

## 2020-03-20 MED ORDER — ONDANSETRON HCL 8 MG PO TABS: 8.0000 mg | ORAL_TABLET | Freq: Three times a day (TID) | ORAL | 0 refills | Status: DC | PRN

## 2020-03-20 MED ORDER — AZITHROMYCIN 250 MG PO TABS: ORAL_TABLET | ORAL | 0 refills | Status: DC

## 2020-03-20 MED ORDER — AMOXICILLIN-POT CLAVULANATE 875-125 MG PO TABS: 1.0000 | ORAL_TABLET | Freq: Two times a day (BID) | ORAL | 0 refills | Status: DC

## 2020-03-20 NOTE — Progress Notes (Signed)
Virtual Visit via Video Note  I connected with Cindy Whitehead on 03/20/20 at 12:00 PM EST by a video enabled telemedicine application and verified that I am speaking with the correct person using two identifiers.  Location: Patient: home Provider: office   I discussed the limitations of evaluation and management by telemedicine and the availability of in person appointments. The patient expressed understanding and agreed to proceed.  Parties involved in encounter  Patient: Daughter Bufford Spikes   Provider:  Loura Pardon MD   History of Present Illness: Pt presents for nausea   Started a week ago  No fever  Some diarrhea - better now - that lasted 2-3 days  ? What she ate prior   Not eating enough to get strong again  Was able to get biscuit down this am   Cough - sometimes phlegm- yellow  No wheeze or sob and tightness  Congestion in head -yellow mucous  Some sinus pain above eyes- tender    She tested positive for covid 12/26 -that is better  Got pretty sick and got mostly better  Still a little cough and congestion - improved    Everything she eats makes her sick  Weak  Tired  Tightness in chest like indigestion   Drinking lots of fluids- juice/ginger ale and wate    imm for covid  [facility] -July and June  No booster - will be 6 months for jan   Otc:  Tylenol  mucinex  Zinc Elderberry  Vit c  Delsym    Patient Active Problem List   Diagnosis Date Noted  . COVID-19 03/20/2020  . Hand paresthesia 08/28/2019  . Dizziness 12/25/2018  . Right-sided chest wall pain 12/13/2018  . Oral aphthous ulcer 07/09/2018  . Tinnitus 12/26/2016  . Urge incontinence 11/24/2015  . Routine general medical examination at a health care facility 05/20/2015  . Family history of colon cancer 05/20/2015  . Chronic back pain 05/20/2015  . Low back pain radiating to right leg 09/02/2014  . Encounter for Medicare annual wellness exam 05/13/2014  . Adjustment disorder with mixed  anxiety and depressed mood 10/29/2013  . GERD (gastroesophageal reflux disease) 10/13/2010  . Vitamin D deficiency 04/14/2010  . Osteopenia 05/26/2008  . ESSENTIAL HYPERTENSION, BENIGN 05/21/2008  . Body mass index (BMI) 38.0-38.9, adult 08/22/2007  . REACTION, ACUTE STRESS W/EMOTIONAL DSTURB 09/14/2006  . Diabetes type 2, controlled ([place]) 05/11/2006  . Hyperlipidemia associated with type 2 diabetes mellitus ([place]) 05/11/2006  . ALLERGIC RHINITIS 05/11/2006  . ASTHMA 05/11/2006   Past Medical History:  Diagnosis Date  . Allergic rhinitis   . Asthma   . Bone loss    ? osteopenia   . Diabetes mellitus   . Hyperlipidemia   . Kidney stones   . Obesity   . Pes planus   . Stress    cares for mohter who is verbally abusive    Past Surgical History:  Procedure Laterality Date  . KNEE SURGERY     Social History   Tobacco Use  . Smoking status: Never Smoker  . Smokeless tobacco: Never Used  Vaping Use  . Vaping Use: Never used  Substance Use Topics  . Alcohol use: No    Alcohol/week: 0.0 standard drinks  . Drug use: No   Family History  Problem Relation Age of Onset  . Colon cancer Mother    Allergies  Allergen Reactions  . Atorvastatin Other (See Comments)    REACTION: leg pain  . Ace  Inhibitors Other (See Comments)    cough   Current Outpatient Medications on File Prior to Visit  Medication Sig Dispense Refill  . Blood Glucose Monitoring Suppl (ONE TOUCH ULTRA MINI) w/Device KIT Use to check blood sugar once daily (dx. E11.9) 1 each 0  . glucose blood (ONE TOUCH ULTRA TEST) test strip Use to check blood sugar once daily (dx. E11.9) 100 each 1  . metFORMIN (GLUCOPHAGE) 500 MG tablet Take 0.5 tablets (250 mg total) by mouth 2 (two) times daily. 90 tablet 3  . Multiple Vitamin (MULTIVITAMIN WITH MINERALS) TABS tablet Take 1 tablet by mouth daily.    . naproxen (NAPROSYN) 500 MG tablet TAKE 1 TABLET (500 MG TOTAL) BY MOUTH 2 (TWO) TIMES DAILY AS NEEDED FOR MODERATE PAIN.  WITH A MEAL 60 tablet 3  . ONETOUCH DELICA LANCETS 00Q MISC Use to check blood sugar once daily (dx. E11.9) 100 each 1  . oxybutynin (DITROPAN-XL) 10 MG 24 hr tablet TAKE 1 TABLET BY MOUTH EVERYDAY AT BEDTIME 90 tablet 3  . simvastatin (ZOCOR) 20 MG tablet TAKE 1/2 TABLET BY MOUTH EVERY EVENING WITH A LOW FAT SNACK 45 tablet 3   No current facility-administered medications on file prior to visit.   Review of Systems  Constitutional: Positive for malaise/fatigue. Negative for chills and fever.  HENT: Positive for congestion and sinus pain. Negative for ear pain and sore throat.   Eyes: Negative for blurred vision, discharge and redness.  Respiratory: Positive for cough and sputum production. Negative for shortness of breath, wheezing and stridor.   Cardiovascular: Negative for chest pain, palpitations and leg swelling.  Gastrointestinal: Negative for abdominal pain, diarrhea, nausea and vomiting.  Musculoskeletal: Negative for myalgias.  Skin: Negative for rash.  Neurological: Positive for sensory change. Negative for dizziness and headaches.    Observations/Objective: Patient appears well, in no distress Weight is baseline  No facial swelling or asymmetry Mildly hoarse voice, occ clears her throat  No obvious tremor or mobility impairment Moving neck and UEs normally Able to hear the call well  No cough or shortness of breath during interview  Talkative and mentally sharp with no cognitive changes No skin changes on face or neck , no rash or pallor Affect is normal    Assessment and Plan: Problem List Items Addressed This Visit      Other   COVID-19    Was dx on 12/26 Congestion and cough improved but still present /some sinus pain and pressure (will tx for sinusitis with zpak) Some feelings of chest pressure with cough- cxr ordered and waiting on report  Cough sounds dry Nausea persists - but keeping fluids down  Disc imp of hydration  Px zofran for nausea  inst to adv  diet very gradually starting with small portions of bland food like bread or crackers        Relevant Medications   azithromycin (ZITHROMAX Z-PAK) 250 MG tablet       Follow Up Instructions: Try zofran for nausea as needed  Keep drinking fluids  Advance diet slowly- small amounts of bland food like bread/toast/apple sauce  If you become dizzy or have a dry mouth let us know (sign of dehydration)  mucinex is ok for congestion  Take zpak for possible sinus infection   I will order a chest xray-the office will call to set that up   I discussed the assessment and treatment plan with the patient. The patient was provided an opportunity to ask questions and all  were answered. The patient agreed with the plan and demonstrated an understanding of the instructions.   The patient was advised to call back or seek an in-person evaluation if the symptoms worsen or if the condition fails to improve as anticipated.     Loura Pardon, MD

## 2020-03-20 NOTE — Telephone Encounter (Signed)
-----   Message from Abner Greenspan, MD sent at 03/20/2020  4:36 PM EST ----- Some infiltrates seen (suspect mild/early pneumonia)  I want to add another antibiotic to cover this along with the zpak  (she will be on 2 abx)  Take the zpak I prescribed earlier today Please call in augmentin 875 mg bid for 7d #14 no refills  I recommend taking with a little food  If suddenly worse or any sob or s/s of dehydration go to the ER Schedule virtual f/u with me next week

## 2020-03-20 NOTE — Patient Instructions (Addendum)
Try zofran for nausea as needed  Keep drinking fluids  Advance diet slowly- small amounts of bland food like bread/toast/apple sauce  If you become dizzy or have a dry mouth let us know (sign of dehydration)  mucinex is ok for congestion  Take zpak for possible sinus infection   I will order a chest xray-the office will call to set that up

## 2020-03-20 NOTE — Assessment & Plan Note (Signed)
Was dx on 12/26 Congestion and cough improved but still present /some sinus pain and pressure (will tx for sinusitis with zpak) Some feelings of chest pressure with cough- cxr ordered and waiting on report  Cough sounds dry Nausea persists - but keeping fluids down  Disc imp of hydration  Px zofran for nausea  inst to adv diet very gradually starting with small portions of bland food like bread or crackers

## 2020-03-20 NOTE — Telephone Encounter (Signed)
Daughter/ pt notified of xray results and Dr. Marliss Coots comments. Rx sent to pharmacy and f/u appt scheduled for next week. ER precautions given

## 2020-03-25 ENCOUNTER — Telehealth (INDEPENDENT_AMBULATORY_CARE_PROVIDER_SITE_OTHER): Payer: Medicare Other | Admitting: Family Medicine

## 2020-03-25 ENCOUNTER — Encounter: Payer: Self-pay | Admitting: Family Medicine

## 2020-03-25 ENCOUNTER — Other Ambulatory Visit: Payer: Self-pay

## 2020-03-25 ENCOUNTER — Other Ambulatory Visit (INDEPENDENT_AMBULATORY_CARE_PROVIDER_SITE_OTHER): Payer: Medicare Other

## 2020-03-25 DIAGNOSIS — R197 Diarrhea, unspecified: Secondary | ICD-10-CM | POA: Diagnosis not present

## 2020-03-25 DIAGNOSIS — R112 Nausea with vomiting, unspecified: Secondary | ICD-10-CM

## 2020-03-25 DIAGNOSIS — U071 COVID-19: Secondary | ICD-10-CM | POA: Diagnosis not present

## 2020-03-25 MED ORDER — ONDANSETRON HCL 8 MG PO TABS: 8.0000 mg | ORAL_TABLET | Freq: Three times a day (TID) | ORAL | 0 refills | Status: DC | PRN

## 2020-03-25 NOTE — Progress Notes (Signed)
Virtual Visit via Video Note  I connected with Cindy Whitehead on 03/25/20 at 12:00 PM EST by a video enabled telemedicine application and verified that I am speaking with the correct person using two identifiers.  Location: Patient: home Provider: office   I discussed the limitations of evaluation and management by telemedicine and the availability of in person appointments. The patient expressed understanding and agreed to proceed.  Parties involved in encounter  Patient: Cindy Whitehead  Provider:  Loura Pardon MD    History of Present Illness:   pt presents for f/u of covid 19 symptoms (dx was on 12/26) At last visit had sinus symptoms and was tx for sinusitis with zpak Noted nausea-px zofran for that  cxr was ordered DG Chest 2 View  Result Date: 03/20/2020 CLINICAL DATA:  Chest tightness and weakness, status post COVID-19. EXAM: CHEST - 2 VIEW COMPARISON:  December 13, 2018 FINDINGS: Mild, ill-defined infiltrates are seen along the periphery of the bilateral lung bases. There is stable elevation of the right hemidiaphragm, without evidence of a pleural effusion or pneumothorax. The heart size and mediastinal contours are within normal limits. There is mild calcification of the aortic arch. Chronic and degenerative changes seen within the thoracolumbar spine. Multiple gallstones are seen within the right upper quadrant, with additional calculi suspected within the left kidney. IMPRESSION: 1. Mild, bibasilar infiltrates. 2. Cholelithiasis. 3. Probable left-sided renal calculi. Electronically Signed   By: Virgina Norfolk M.D.   On: 03/20/2020 16:11   Another abx (augmentin) was added to cover for poss CAP Of note- the gallstones are not a new finding)   Improved today  Chest no longer bothers her  Cough is diminished (mostly at night)   Some nausea -not as bad as it was  Yesterday ok until dinner time (then nausea) -does not matter what she eats  Last vomiting was last night  No abd  pain at all Some diarrhea as well (not every day but pretty often)  Legs hurt at night /tingling also =some cramps   Fluids - water, coke , ginger ale  Does not have gatorade   Patient Active Problem List   Diagnosis Date Noted  . Nausea vomiting and diarrhea 03/25/2020  . COVID-19 03/20/2020  . Hand paresthesia 08/28/2019  . Dizziness 12/25/2018  . Right-sided chest wall pain 12/13/2018  . Oral aphthous ulcer 07/09/2018  . Tinnitus 12/26/2016  . Urge incontinence 11/24/2015  . Routine general medical examination at a health care facility 05/20/2015  . Family history of colon cancer 05/20/2015  . Chronic back pain 05/20/2015  . Low back pain radiating to right leg 09/02/2014  . Encounter for Medicare annual wellness exam 05/13/2014  . Adjustment disorder with mixed anxiety and depressed mood 10/29/2013  . GERD (gastroesophageal reflux disease) 10/13/2010  . Vitamin D deficiency 04/14/2010  . Osteopenia 05/26/2008  . ESSENTIAL HYPERTENSION, BENIGN 05/21/2008  . Body mass index (BMI) 38.0-38.9, adult 08/22/2007  . REACTION, ACUTE STRESS W/EMOTIONAL DSTURB 09/14/2006  . Diabetes type 2, controlled ([place]) 05/11/2006  . Hyperlipidemia associated with type 2 diabetes mellitus ([place]) 05/11/2006  . ALLERGIC RHINITIS 05/11/2006  . ASTHMA 05/11/2006   Past Medical History:  Diagnosis Date  . Allergic rhinitis   . Asthma   . Bone loss    ? osteopenia   . Diabetes mellitus   . Hyperlipidemia   . Kidney stones   . Obesity   . Pes planus   . Stress    cares for mohter  who is verbally abusive    Past Surgical History:  Procedure Laterality Date  . KNEE SURGERY     Social History   Tobacco Use  . Smoking status: Never Smoker  . Smokeless tobacco: Never Used  Vaping Use  . Vaping Use: Never used  Substance Use Topics  . Alcohol use: No    Alcohol/week: 0.0 standard drinks  . Drug use: No   Family History  Problem Relation Age of Onset  . Colon cancer Mother     Allergies  Allergen Reactions  . Atorvastatin Other (See Comments)    REACTION: leg pain  . Ace Inhibitors Other (See Comments)    cough   Current Outpatient Medications on File Prior to Visit  Medication Sig Dispense Refill  . amoxicillin-clavulanate (AUGMENTIN) 875-125 MG tablet Take 1 tablet by mouth 2 (two) times daily. 14 tablet 0  . Blood Glucose Monitoring Suppl (ONE TOUCH ULTRA MINI) w/Device KIT Use to check blood sugar once daily (dx. E11.9) 1 each 0  . glucose blood (ONE TOUCH ULTRA TEST) test strip Use to check blood sugar once daily (dx. E11.9) 100 each 1  . metFORMIN (GLUCOPHAGE) 500 MG tablet Take 0.5 tablets (250 mg total) by mouth 2 (two) times daily. 90 tablet 3  . Multiple Vitamin (MULTIVITAMIN WITH MINERALS) TABS tablet Take 1 tablet by mouth daily.    . naproxen (NAPROSYN) 500 MG tablet TAKE 1 TABLET (500 MG TOTAL) BY MOUTH 2 (TWO) TIMES DAILY AS NEEDED FOR MODERATE PAIN. WITH A MEAL 60 tablet 3  . ONETOUCH DELICA LANCETS 69G MISC Use to check blood sugar once daily (dx. E11.9) 100 each 1  . oxybutynin (DITROPAN-XL) 10 MG 24 hr tablet TAKE 1 TABLET BY MOUTH EVERYDAY AT BEDTIME 90 tablet 3  . simvastatin (ZOCOR) 20 MG tablet TAKE 1/2 TABLET BY MOUTH EVERY EVENING WITH A LOW FAT SNACK 45 tablet 3   No current facility-administered medications on file prior to visit.   Review of Systems  Constitutional: Negative for chills, fever and malaise/fatigue.       Some fatigue- is improving  HENT: Positive for congestion. Negative for ear pain, sinus pain and sore throat.   Eyes: Negative for blurred vision, discharge and redness.  Respiratory: Positive for cough. Negative for sputum production, shortness of breath, wheezing and stridor.   Cardiovascular: Negative for chest pain, palpitations and leg swelling.  Gastrointestinal: Positive for diarrhea, nausea and vomiting. Negative for abdominal pain.  Musculoskeletal: Negative for myalgias.  Skin: Negative for rash.   Neurological: Negative for dizziness and headaches.    Observations/Objective: Patient appears well, in no distress Improved from last time -energy level is higher  Weight is baseline  No facial swelling or asymmetry Normal voice-not hoarse and no slurred speech No obvious tremor or mobility impairment Moving neck and UEs normally Able to hear the call well  No cough or shortness of breath during interview  Talkative and mentally sharp with no cognitive changes No skin changes on face or neck , no rash or pallor Affect is normal    Assessment and Plan: Problem List Items Addressed This Visit      Digestive   Nausea vomiting and diarrhea    Intermittent (not all the time) following covid 19  Some leg cramps (want to check a K if we can)-order put in for bmet  Also re sent zofran (the pharmacy did not give it to her last time) to help nausea  Continue bland diet/small portions  Update if not starting to improve in a week or if worsening   Rev s/s of dehydration        Relevant Orders   Basic metabolic panel     Other   COVID-19 - Primary    Overall improving but still some intermittent n/v/d tx pneumonia with zpak and augmentin (taken with food) and breathing/cough have improved Sent in zofran (per pt it did not go the first time?)  Disc s/s of dehydration to watch for and need to continue fluids best she can (consider sport drink for K)  Lab ordered for bmet in light of leg cramps (? Possible low K) and she will arrange to come to the office  ER parameters reviewed  Update if no further improvement this week          Follow Up Instructions:   Continue to drink fluids and rest  Try the zofran for nausea /I sent it again  Let me know if this does not help  When able -advance diet (bland) very slowly  If new symptoms let us know  If you feel dehydrated (rapid heart rate  ,dry mouth and dizzy) go to the ER  Update if not starting to improve in a week or if  worsening   The office will call you to set up a blood draw to check potassium I discussed the assessment and treatment plan with the patient. The patient was provided an opportunity to ask questions and all were answered. The patient agreed with the plan and demonstrated an understanding of the instructions.   The patient was advised to call back or seek an in-person evaluation if the symptoms worsen or if the condition fails to improve as anticipated.     Loura Pardon, MD

## 2020-03-25 NOTE — Assessment & Plan Note (Signed)
Overall improving but still some intermittent n/v/d tx pneumonia with zpak and augmentin (taken with food) and breathing/cough have improved Sent in zofran (per pt it did not go the first time?)  Disc s/s of dehydration to watch for and need to continue fluids best she can (consider sport drink for K)  Lab ordered for bmet in light of leg cramps (? Possible low K) and she will arrange to come to the office  ER parameters reviewed  Update if no further improvement this week

## 2020-03-25 NOTE — Patient Instructions (Signed)
Continue to drink fluids and rest  Try the zofran for nausea /I sent it again  Let me know if this does not help  When able -advance diet (bland) very slowly  If new symptoms let us know  If you feel dehydrated (rapid heart rate  ,dry mouth and dizzy) go to the ER  Update if not starting to improve in a week or if worsening   The office will call you to set up a blood draw to check potassium

## 2020-03-25 NOTE — Assessment & Plan Note (Signed)
Intermittent (not all the time) following covid 19  Some leg cramps (want to check a K if we can)-order put in for bmet  Also re sent zofran (the pharmacy did not give it to her last time) to help nausea  Continue bland diet/small portions  Update if not starting to improve in a week or if worsening   Rev s/s of dehydration

## 2020-03-26 ENCOUNTER — Telehealth: Payer: Self-pay | Admitting: *Deleted

## 2020-03-26 LAB — BASIC METABOLIC PANEL
BUN: 17 mg/dL (ref 6–23)
CO2: 25 mEq/L (ref 19–32)
Calcium: 9.1 mg/dL (ref 8.4–10.5)
Chloride: 107 mEq/L (ref 96–112)
Creatinine, Ser: 0.81 mg/dL (ref 0.40–1.20)
GFR: 67.61 mL/min (ref >60.00)
Glucose, Bld: 116 mg/dL — ABNORMAL HIGH (ref 70–99)
Potassium: 4.2 mEq/L (ref 3.5–5.1)
Sodium: 139 mEq/L (ref 135–145)

## 2020-03-26 NOTE — Telephone Encounter (Signed)
Left VM requesting pt to call the office back 

## 2020-03-26 NOTE — Telephone Encounter (Signed)
-----   Message from Abner Greenspan, MD sent at 03/26/2020  2:01 PM EST ----- Labs are ok  K is not low  Keep working on fluid intake Let us know if symptoms do not improve

## 2020-05-18 ENCOUNTER — Other Ambulatory Visit: Payer: Self-pay

## 2020-05-18 ENCOUNTER — Encounter: Payer: Self-pay | Admitting: Family Medicine

## 2020-05-18 ENCOUNTER — Ambulatory Visit (INDEPENDENT_AMBULATORY_CARE_PROVIDER_SITE_OTHER): Payer: Medicare Other | Admitting: Family Medicine

## 2020-05-18 VITALS — BP 128/72 | HR 79 | Temp 96.9°F | Ht <= 58 in | Wt 176.4 lb

## 2020-05-18 DIAGNOSIS — Z6837 Body mass index (BMI) 37.0-37.9, adult: Secondary | ICD-10-CM

## 2020-05-18 DIAGNOSIS — E119 Type 2 diabetes mellitus without complications: Secondary | ICD-10-CM | POA: Diagnosis not present

## 2020-05-18 DIAGNOSIS — E1169 Type 2 diabetes mellitus with other specified complication: Secondary | ICD-10-CM

## 2020-05-18 DIAGNOSIS — I10 Essential (primary) hypertension: Secondary | ICD-10-CM

## 2020-05-18 DIAGNOSIS — E785 Hyperlipidemia, unspecified: Secondary | ICD-10-CM

## 2020-05-18 DIAGNOSIS — R202 Paresthesia of skin: Secondary | ICD-10-CM

## 2020-05-18 LAB — COMPREHENSIVE METABOLIC PANEL
ALT: 15 U/L (ref 0–35)
AST: 23 U/L (ref 0–37)
Albumin: 3.7 g/dL (ref 3.5–5.2)
Alkaline Phosphatase: 57 U/L (ref 39–117)
BUN: 15 mg/dL (ref 6–23)
CO2: 27 mEq/L (ref 19–32)
Calcium: 9.3 mg/dL (ref 8.4–10.5)
Chloride: 107 mEq/L (ref 96–112)
Creatinine, Ser: 0.73 mg/dL (ref 0.40–1.20)
GFR: 76.52 mL/min (ref >60.00)
Glucose, Bld: 109 mg/dL — ABNORMAL HIGH (ref 70–99)
Potassium: 4.4 mEq/L (ref 3.5–5.1)
Sodium: 140 mEq/L (ref 135–145)
Total Bilirubin: 0.9 mg/dL (ref 0.2–1.2)
Total Protein: 6.8 g/dL (ref 6.0–8.3)

## 2020-05-18 LAB — LIPID PANEL
Cholesterol: 129 mg/dL (ref 0–200)
HDL: 47.4 mg/dL (ref >39.00)
LDL Cholesterol: 58 mg/dL (ref 0–99)
NonHDL: 81.47
Total CHOL/HDL Ratio: 3
Triglycerides: 118 mg/dL (ref 0.0–149.0)
VLDL: 23.6 mg/dL (ref 0.0–40.0)

## 2020-05-18 LAB — HEMOGLOBIN A1C: Hgb A1c MFr Bld: 5.9 % (ref 4.6–6.5)

## 2020-05-18 LAB — MICROALBUMIN / CREATININE URINE RATIO
Creatinine,U: 146.3 mg/dL
Microalb Creat Ratio: 2.2 mg/g (ref 0.0–30.0)
Microalb, Ur: 3.2 mg/dL — ABNORMAL HIGH (ref 0.0–1.9)

## 2020-05-18 NOTE — Progress Notes (Signed)
Subjective:    Patient ID: Cindy Whitehead, female    DOB: Mar 26, 1935, 85 y.o.   MRN: [id number]  This visit occurred during the SARS-CoV-2 public health emergency.  Safety protocols were in place, including screening questions prior to the visit, additional usage of staff PPE, and extensive cleaning of exam room while observing appropriate contact time as indicated for disinfecting solutions.    HPI Pt presents for f/u of chronic health problems  Wt Readings from Last 3 Encounters:  05/18/20 176 lb 6 oz (80 kg)  11/18/19 181 lb 5 oz (82.2 kg)  09/16/19 181 lb 7 oz (82.3 kg)  intentions weight loss/ 7 lb  She cut back on sweets  37.51 kg/m   Feeling ok overall  Not doing a whole lot   Wears her mask  Had covid  Feeling better   Her hand/right is not getting better  Thumb bothers her  Wrist brace did not help a lot   HTN bp is stable today  No cp or palpitations or headaches or edema  No side effects to medicines  BP Readings from Last 3 Encounters:  05/18/20 128/72  11/18/19 134/80  09/16/19 140/78    No medications at this time Lifestyle change for control   DM2 Lab Results  Component Value Date   HGBA1C 6.1 11/18/2019   Due for labs  Taking metformin 250 mg bid  No blood sugar problems  Needs a new meter Going for walks at lunch   Eating better occ pc of candy    On statin  Intol of ace Lab Results  Component Value Date   MICROALBUR 2.2 (H) 10/30/2018   MICROALBUR 1.6 06/12/2017  due for microalbumin  Hyperlipidemia Lab Results  Component Value Date   CHOL 129 11/18/2019   HDL 45.90 11/18/2019   LDLCALC 57 11/18/2019   LDLDIRECT 94.0 11/11/2014   TRIG 127.0 11/18/2019   CHOLHDL 3 11/18/2019   Due for labs Taking simvasatatin 20 mg daily    Patient Active Problem List   Diagnosis Date Noted   Hand paresthesia 08/28/2019   Tinnitus 12/26/2016   Urge incontinence 11/24/2015   Routine general medical examination at a health care  facility 05/20/2015   Family history of colon cancer 05/20/2015   Chronic back pain 05/20/2015   Low back pain radiating to right leg 09/02/2014   Encounter for Medicare annual wellness exam 05/13/2014   Adjustment disorder with mixed anxiety and depressed mood 10/29/2013   GERD (gastroesophageal reflux disease) 10/13/2010   Vitamin D deficiency 04/14/2010   Osteopenia 05/26/2008   ESSENTIAL HYPERTENSION, BENIGN 05/21/2008   Class 2 severe obesity due to excess calories with serious comorbidity and body mass index (BMI) of 37.0 to 37.9 in adult ([place]) 08/22/2007   REACTION, ACUTE STRESS W/EMOTIONAL DSTURB 09/14/2006   Diabetes type 2, controlled ([place]) 05/11/2006   Hyperlipidemia associated with type 2 diabetes mellitus ([place]) 05/11/2006   ALLERGIC RHINITIS 05/11/2006   ASTHMA 05/11/2006   Past Medical History:  Diagnosis Date   Allergic rhinitis    Asthma    Bone loss    ? osteopenia    Diabetes mellitus    Hyperlipidemia    Kidney stones    Obesity    Pes planus    Stress    cares for mohter who is verbally abusive    Past Surgical History:  Procedure Laterality Date   KNEE SURGERY     Social History   Tobacco Use   Smoking  status: Never Smoker   Smokeless tobacco: Never Used  Vaping Use   Vaping Use: Never used  Substance Use Topics   Alcohol use: No    Alcohol/week: 0.0 standard drinks   Drug use: No   Family History  Problem Relation Age of Onset   Colon cancer Mother    Allergies  Allergen Reactions   Atorvastatin Other (See Comments)    REACTION: leg pain   Ace Inhibitors Other (See Comments)    cough   Current Outpatient Medications on File Prior to Visit  Medication Sig Dispense Refill   Blood Glucose Monitoring Suppl (ONE TOUCH ULTRA MINI) w/Device KIT Use to check blood sugar once daily (dx. E11.9) 1 each 0   glucose blood (ONE TOUCH ULTRA TEST) test strip Use to check blood sugar once daily (dx. E11.9) 100 each 1    metFORMIN (GLUCOPHAGE) 500 MG tablet Take 0.5 tablets (250 mg total) by mouth 2 (two) times daily. 90 tablet 3   Multiple Vitamin (MULTIVITAMIN WITH MINERALS) TABS tablet Take 1 tablet by mouth daily.     naproxen (NAPROSYN) 500 MG tablet TAKE 1 TABLET (500 MG TOTAL) BY MOUTH 2 (TWO) TIMES DAILY AS NEEDED FOR MODERATE PAIN. WITH A MEAL 60 tablet 3   ONETOUCH DELICA LANCETS 78M MISC Use to check blood sugar once daily (dx. E11.9) 100 each 1   oxybutynin (DITROPAN-XL) 10 MG 24 hr tablet TAKE 1 TABLET BY MOUTH EVERYDAY AT BEDTIME 90 tablet 3   simvastatin (ZOCOR) 20 MG tablet TAKE 1/2 TABLET BY MOUTH EVERY EVENING WITH A LOW FAT SNACK 45 tablet 3   No current facility-administered medications on file prior to visit.    Review of Systems  Constitutional: Positive for fatigue. Negative for activity change, appetite change, fever and unexpected weight change.  HENT: Negative for congestion, ear pain, rhinorrhea, sinus pressure and sore throat.   Eyes: Negative for pain, redness and visual disturbance.  Respiratory: Negative for cough, shortness of breath and wheezing.   Cardiovascular: Negative for chest pain and palpitations.  Gastrointestinal: Negative for abdominal pain, blood in stool, constipation and diarrhea.  Endocrine: Negative for polydipsia and polyuria.  Genitourinary: Negative for dysuria, frequency and urgency.  Musculoskeletal: Positive for arthralgias. Negative for back pain and myalgias.  Skin: Negative for pallor and rash.  Allergic/Immunologic: Negative for environmental allergies.  Neurological: Negative for dizziness, syncope and headaches.  Hematological: Negative for adenopathy. Does not bruise/bleed easily.  Psychiatric/Behavioral: Negative for decreased concentration and dysphoric mood. The patient is nervous/anxious.        Objective:   Physical Exam Constitutional:      General: She is not in acute distress.    Appearance: Normal appearance. She is  well-developed. She is obese. She is not ill-appearing or diaphoretic.  HENT:     Head: Normocephalic and atraumatic.  Eyes:     General: No scleral icterus.    Conjunctiva/sclera: Conjunctivae normal.     Pupils: Pupils are equal, round, and reactive to light.  Neck:     Thyroid: No thyromegaly.     Vascular: No carotid bruit or JVD.  Cardiovascular:     Rate and Rhythm: Normal rate and regular rhythm.     Heart sounds: Normal heart sounds. No gallop.   Pulmonary:     Effort: Pulmonary effort is normal. No respiratory distress.     Breath sounds: Normal breath sounds. No wheezing or rales.  Abdominal:     General: Bowel sounds are normal. There is  no distension or abdominal bruit.     Palpations: Abdomen is soft. There is no mass.     Tenderness: There is no abdominal tenderness.  Musculoskeletal:     Cervical back: Normal range of motion and neck supple.     Right lower leg: No edema.     Left lower leg: No edema.  Lymphadenopathy:     Cervical: No cervical adenopathy.  Skin:    General: Skin is warm and dry.     Findings: No rash.  Neurological:     Mental Status: She is alert.     Sensory: No sensory deficit.     Coordination: Coordination normal.     Deep Tendon Reflexes: Reflexes are normal and symmetric. Reflexes normal.  Psychiatric:        Mood and Affect: Mood normal.           Assessment & Plan:   Problem List Items Addressed This Visit      Cardiovascular and Mediastinum   ESSENTIAL HYPERTENSION, BENIGN    bp in fair control at this time  BP Readings from Last 1 Encounters:  05/18/20 128/72   No medications needed currently, controlled with healthier lifestyle Most recent labs reviewed  Disc lifstyle change with low sodium diet and exercise        Relevant Orders   Comprehensive metabolic panel (Completed)   Lipid panel (Completed)     Endocrine   Diabetes type 2, controlled ([place]) - Primary    Has been well controlled with metformin 250 mg  bid and diet  Now walking almost daily and eating better a1c ordered and expect continued good control microalbumin also ordered Taking statin but intolerant of ace Enc her to keep up with yearly DM eye exams      Relevant Orders   For home use only DME Other see comment   Hemoglobin A1c (Completed)   Microalbumin / creatinine urine ratio (Completed)   Hyperlipidemia associated with type 2 diabetes mellitus ([place])    Due for labs  Disc goals for lipids and reasons to control them Rev last labs with pt Rev low sat fat diet in detail Good control with diet and simvastatin 20 mg daily      Relevant Orders   Lipid panel (Completed)     Other   Class 2 severe obesity due to excess calories with serious comorbidity and body mass index (BMI) of 37.0 to 37.9 in adult [facility])    Discussed how this problem influences overall health and the risks it imposes  Reviewed plan for weight loss with lower calorie diet (via better food choices and also portion control or program like weight watchers) and exercise building up to or more than 30 minutes 5 days per week including some aerobic activity   Commended on wt lost so far      Hand paresthesia    Hand and thumb continue to tingle (bothersome) No weakness  Wrist splint did not help much  Ref made to hand specialist       Relevant Orders   Ambulatory referral to Orthopedic Surgery   For home use only DME Other see comment

## 2020-05-18 NOTE — Patient Instructions (Addendum)
We will refer you to a hand specialist- you will get a call   Labs today   Stay active  Keep eating better Try to get most of your carbohydrates from produce (with the exception of white potatoes)  Eat less bread/pasta/rice/snack foods/cereals/sweets and other items from the middle of the grocery store (processed carbs)

## 2020-05-19 NOTE — Assessment & Plan Note (Signed)
Discussed how this problem influences overall health and the risks it imposes  Reviewed plan for weight loss with lower calorie diet (via better food choices and also portion control or program like weight watchers) and exercise building up to or more than 30 minutes 5 days per week including some aerobic activity   Commended on wt lost so far

## 2020-05-19 NOTE — Assessment & Plan Note (Signed)
Hand and thumb continue to tingle (bothersome) No weakness  Wrist splint did not help much  Ref made to hand specialist

## 2020-05-19 NOTE — Assessment & Plan Note (Signed)
Due for labs  Disc goals for lipids and reasons to control them Rev last labs with pt Rev low sat fat diet in detail Good control with diet and simvastatin 20 mg daily

## 2020-05-19 NOTE — Assessment & Plan Note (Signed)
Has been well controlled with metformin 250 mg bid and diet  Now walking almost daily and eating better a1c ordered and expect continued good control microalbumin also ordered Taking statin but intolerant of ace Enc her to keep up with yearly DM eye exams

## 2020-05-19 NOTE — Assessment & Plan Note (Signed)
bp in fair control at this time  BP Readings from Last 1 Encounters:  05/18/20 128/72   No medications needed currently, controlled with healthier lifestyle Most recent labs reviewed  Disc lifstyle change with low sodium diet and exercise

## 2020-06-16 ENCOUNTER — Telehealth: Payer: Self-pay | Admitting: Family Medicine

## 2020-06-16 DIAGNOSIS — G5601 Carpal tunnel syndrome, right upper limb: Secondary | ICD-10-CM | POA: Diagnosis not present

## 2020-06-16 DIAGNOSIS — M67814 Other specified disorders of tendon, left shoulder: Secondary | ICD-10-CM | POA: Diagnosis not present

## 2020-06-16 MED ORDER — ONETOUCH DELICA LANCETS 33G MISC: 1 refills | Status: DC

## 2020-06-16 MED ORDER — GLUCOSE BLOOD VI STRP: ORAL_STRIP | 1 refills | Status: DC

## 2020-06-16 MED ORDER — ONETOUCH ULTRA MINI W/DEVICE KIT: PACK | 0 refills | Status: DC

## 2020-06-16 NOTE — Telephone Encounter (Signed)
Rx sent to pharmacy   

## 2020-06-16 NOTE — Telephone Encounter (Signed)
Patient came stated she was not able to get her Glucose meter for diabetes check . From [facility] stated she need it to be call in

## 2020-10-01 ENCOUNTER — Other Ambulatory Visit: Payer: Self-pay

## 2020-10-01 ENCOUNTER — Ambulatory Visit (INDEPENDENT_AMBULATORY_CARE_PROVIDER_SITE_OTHER): Payer: Medicare Other | Admitting: Family Medicine

## 2020-10-01 ENCOUNTER — Encounter: Payer: Self-pay | Admitting: Family Medicine

## 2020-10-01 VITALS — BP 140/70 | HR 67 | Temp 97.7°F | Ht <= 58 in | Wt 177.5 lb

## 2020-10-01 DIAGNOSIS — R202 Paresthesia of skin: Secondary | ICD-10-CM

## 2020-10-01 DIAGNOSIS — G8929 Other chronic pain: Secondary | ICD-10-CM

## 2020-10-01 DIAGNOSIS — M79605 Pain in left leg: Secondary | ICD-10-CM | POA: Diagnosis not present

## 2020-10-01 DIAGNOSIS — M545 Low back pain, unspecified: Secondary | ICD-10-CM

## 2020-10-01 DIAGNOSIS — M79604 Pain in right leg: Secondary | ICD-10-CM

## 2020-10-01 MED ORDER — GABAPENTIN 100 MG PO CAPS: 300.0000 mg | ORAL_CAPSULE | Freq: Every day | ORAL | 3 refills | Status: DC

## 2020-10-01 NOTE — Patient Instructions (Signed)
Generic Gabapentin Titration Schedule  Generic Gabapentin (generic form of Neurontin) comes in 100 mg tablets or capsules.   You have to titrate your dose slowly to reduce side effects and reduce sedation / sleepiness.    Week               Breakfast  Lunch   Dinner One                 0   0   100 mg Two   0   0   '200mg'$  Three   0    0   '300mg'$   If you have any problems at any time, drop back to the previous dosing schedule. Continue with this dose for 1 week, and then try to go up the next step again.

## 2020-10-01 NOTE — Progress Notes (Signed)
Cindy Whitehead T. Pricella Gaugh, MD, [place] at [facility] [place], [address]  Phone: [phone]  FAX: [phone]  Cindy Whitehead - 85 y.o. female  MRN [id number]  Date of Birth: Jun 23, 1935  Date: 10/01/2020  PCP: Abner Greenspan, MD  Referral: Abner Greenspan, MD  Chief Complaint  Patient presents with   Leg Cramps    At night    Back Pain    Pain Medication Dr. Glori Bickers gave her is not working   Leg Pain    Bilateral     This visit occurred during the SARS-CoV-2 public health emergency.  Safety protocols were in place, including screening questions prior to the visit, additional usage of staff PPE, and extensive cleaning of exam room while observing appropriate contact time as indicated for disinfecting solutions.   Subjective:   Cindy Whitehead is a 85 y.o. very pleasant female patient with Body mass index is 37.75 kg/m. who presents with the following:  Multiple complaints.  I was asked to see her acutely as a work-in for cramps, but now she has some questions about other ongoing pain.  Chronic back pain.  This is not new, and it has been an intermittent ongoing problem now for years.  She does have focal back pain and sometimes she has had some radicular pain down into her right leg.  Dr. Glori Bickers recently did give her some naproxen, but at this point she is still having ongoing back pain, and this is been bothersome to her on a day-to-day basis.  She does have pain in her cramps in her feet, but she is not having classic muscular contraction cramps.  There is a sensation however that is feels as if it is cramping, and this is been worse when she is sleeping.    Review of Systems is noted in the HPI, as appropriate  Objective:   BP 140/70   Pulse 67   Temp 97.7 F (36.5 C) (Temporal)   Ht 4' 9.5" (1.461 m)   Wt 177 lb 8 oz (80.5 kg)   SpO2 98%   BMI 37.75 kg/m    Range of motion at  the waist: Flexion:  normal Extension: normal Lateral bending: normal Rotation: all normal  No echymosis or edema Rises to examination table with no difficulty Gait: non antalgic  Inspection/Deformity: N Paraspinus Tenderness: L2-S1, mild  B Ankle Dorsiflexion (L5,4): 5/5 B Great Toe Dorsiflexion (L5,4): 5/5 Heel Walk (L5): WNL Toe Walk (S1): WNL Rise/Squat (L4): WNL  SENSORY B Medial Foot (L4): WNL B Dorsum (L5): WNL B Lateral (S1): WNL  B SLR, seated: neg B FABER: neg B Reverse FABER: neg B Greater Troch: NT B Log Roll: neg  Approaching full range of motion at the hip with no pain with terminal rotation maneuvers.  Laboratory and Imaging Data:  Assessment and Plan:     ICD-10-CM   1. Tingling of both feet  R20.2     2. Leg pain, bilateral  M79.604    M79.605     3. Chronic midline low back pain without sciatica  M54.50    G89.29      Nonfocal exam.  She certainly does have chronic back pain going on for years with ongoing exacerbation.  Tingling without sensation loss or changes in strength.  Cramping sensation.  More typically from spine by history, cannot fully exclude secondary to diabetic neuropathy.  Nevertheless, I think that neuropathic pain agents would  be appropriate regardless.  Slow titration with this 85 year old.  Sedation being the limiting step.  I will try to see if taking at nighttime will help, but decrease her sedation/balance issues during the daytime.  Patient Instructions  Generic Gabapentin Titration Schedule  Generic Gabapentin (generic form of Neurontin) comes in 100 mg tablets or capsules.   You have to titrate your dose slowly to reduce side effects and reduce sedation / sleepiness.    Week               Breakfast  Lunch   Dinner One                 0   0   100 mg Two   0   0   267m Three   0    0   306m If you have any problems at any time, drop back to the previous dosing schedule. Continue with this dose for 1 week, and then try to go up  the next step again.     Meds ordered this encounter  Medications   gabapentin (NEURONTIN) 100 MG capsule    Sig: Take 3 capsules (300 mg total) by mouth at bedtime. Taper dose upward as directed    Dispense:  90 capsule    Refill:  3   There are no discontinued medications. No orders of the defined types were placed in this encounter.   Follow-up: No follow-ups on file.  Signed,  SpMaud DeedCopland, MD   Outpatient Encounter Medications as of 10/01/2020  Medication Sig   Blood Glucose Monitoring Suppl (ONE TOUCH ULTRA MINI) w/Device KIT Use to check blood sugar once daily (dx. E11.9)   gabapentin (NEURONTIN) 100 MG capsule Take 3 capsules (300 mg total) by mouth at bedtime. Taper dose upward as directed   glucose blood (ONE TOUCH ULTRA TEST) test strip Use to check blood sugar once daily (dx. E11.9)   metFORMIN (GLUCOPHAGE) 500 MG tablet Take 0.5 tablets (250 mg total) by mouth 2 (two) times daily.   Multiple Vitamin (MULTIVITAMIN WITH MINERALS) TABS tablet Take 1 tablet by mouth daily.   naproxen (NAPROSYN) 500 MG tablet TAKE 1 TABLET (500 MG TOTAL) BY MOUTH 2 (TWO) TIMES DAILY AS NEEDED FOR MODERATE PAIN. WITH A MEAL   OneTouch Delica Lancets 3370YISC Use to check blood sugar once daily (dx. E11.9)   oxybutynin (DITROPAN-XL) 10 MG 24 hr tablet TAKE 1 TABLET BY MOUTH EVERYDAY AT BEDTIME   simvastatin (ZOCOR) 20 MG tablet TAKE 1/2 TABLET BY MOUTH EVERY EVENING WITH A LOW FAT SNACK   No facility-administered encounter medications on file as of 10/01/2020.

## 2020-11-04 ENCOUNTER — Ambulatory Visit (INDEPENDENT_AMBULATORY_CARE_PROVIDER_SITE_OTHER): Payer: Medicare Other | Admitting: Family Medicine

## 2020-11-04 ENCOUNTER — Other Ambulatory Visit: Payer: Self-pay

## 2020-11-04 ENCOUNTER — Telehealth: Payer: Self-pay

## 2020-11-04 ENCOUNTER — Encounter: Payer: Self-pay | Admitting: Family Medicine

## 2020-11-04 DIAGNOSIS — L239 Allergic contact dermatitis, unspecified cause: Secondary | ICD-10-CM | POA: Diagnosis not present

## 2020-11-04 DIAGNOSIS — L259 Unspecified contact dermatitis, unspecified cause: Secondary | ICD-10-CM | POA: Insufficient documentation

## 2020-11-04 MED ORDER — PREDNISONE 10 MG PO TABS: ORAL_TABLET | ORAL | 0 refills | Status: DC
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
## 2020-11-04 NOTE — Telephone Encounter (Signed)
Pt already has appt with DR Glori Bickers 11/04/20 at 11 AM.

## 2020-11-04 NOTE — Telephone Encounter (Signed)
[name] - Client TELEPHONE ADVICE RECORD [facility] Patient Name: Cindy Whitehead Gender: Female DOB: 04-27-1935 Age: 85 Y 40 D Return Phone Number: [phone] (Primary), [phone] (Secondary) Address: City/ State/ Zip: [place] [address] Client [name] - Client Client Site [name] MD Contact Type Call Who Is Calling Patient / Member / Family / Caregiver Call Type Triage / Clinical Relationship To Patient Self Return Phone Number [phone] (Secondary) Chief Complaint Rash - Widespread Reason for Call Symptomatic / Request for [name] states she has a rash all over her body. Caller states the rash is itchy and she scratch the area open. Translation No Nurse Assessment Nurse: Doyle Askew, RN, Beth Date/Time (Eastern Time): 11/04/2020 9:41:00 AM Confirm and document reason for call. If symptomatic, describe symptoms. ---Caller states she has a rash all over her body. Caller states the rash is itchy and she scratch the area open. Caller states rash started 3 days ago. Does the patient have any new or worsening symptoms? ---Yes Will a triage be completed? ---Yes Related visit to physician within the last 2 weeks? ---N/A Does the PT have any chronic conditions? (i.e. diabetes, asthma, this includes High risk factors for pregnancy, etc.) ---Yes List chronic conditions. ---diabetic Is this a behavioral health or substance abuse call? ---No Guidelines Guideline Title Affirmed Question Affirmed Notes Nurse Date/Time (Eastern Time) Rash or Redness - Widespread [1] Purple or bloodcolored rash (spots or dots) AND [2] no fever AND [3] sounds well to triager Doyle Askew, RN, [facility] 11/04/2020 9:42:02 AM Disp. Time [name] Time) Disposition Final User PLEASE NOTE: All timestamps contained within this report are represented as  [place] Standard Time. CONFIDENTIALTY NOTICE: This fax transmission is intended only for the addressee. It contains information that is legally privileged, confidential or otherwise protected from use or disclosure. If you are not the intended recipient, you are strictly prohibited from reviewing, disclosing, copying using or disseminating any of this information or taking any action in reliance on or regarding this information. If you have received this fax in error, please notify us immediately by telephone so that we can arrange for its return to [place]. Phone: [phone], Toll-Free: [phone], Fax: [phone] Page: 2 of 2 Call Id: [id number] 11/04/2020 9:48:16 AM See HCP within 4 Hours (or PCP triage) Yes Doyle Askew, RN, Beth Caller Disagree/Comply Comply Caller Understands Yes PreDisposition Call Doctor Care Advice Given Per Guideline SEE HCP (OR PCP TRIAGE) WITHIN 4 HOURS: * IF OFFICE WILL BE OPEN: You need to be seen within the next 3 or 4 hours. Call your doctor (or NP/PA) now or as soon as the office opens. BRING MEDICINES: * Please bring a list of your current medicines when you go to see the doctor. * It is also a good idea to bring the pill bottles too. This will help the doctor to make certain you are taking the right medicines and the right dose. CARE ADVICE given per Rash - Widespread and Cause Unknown (Adult) guideline. Referrals Warm transfer to backline

## 2020-11-04 NOTE — Assessment & Plan Note (Signed)
Itchy rash resembles plant dermatitis-? When or where contacted  Possible on clothing or pet fur Disc use of antihistamine otc Also prednisone 40 mg taper px Disc side effects incl inc glucose readings  Update if not starting to improve in a week or if worsening

## 2020-11-04 NOTE — Telephone Encounter (Signed)
[name] - Client Nonclinical Telephone Record [facility] Client [name] - Client Client Site [place] Physician Loura Pardon - MD Contact Type Call Who Is Calling Patient / Member / Family / Caregiver Caller Name Jaleaha Hight Caller Phone Number [phone] Patient Name Cindy Whitehead Patient DOB 23-Apr-1935 Call Type Message Only Information Provided Reason for Call Request to Schedule Office Appointment Initial Comment Caller states, needs an appt to be seen today. has rash, very itchy. Disp. Time Disposition Final User 11/04/2020 8:12:55 AM General Information Provided Yes Jobie Quaker Call Closed By: Jobie Quaker Transaction Date/Time: 11/04/2020 8:10:37 AM (ET)

## 2020-11-04 NOTE — Patient Instructions (Signed)
I think you have contact dermatitis   Could be poison ivy or oak Wash clothes or anything that could touch plant  You may want to bathe your pet   Get zyrtec or allegra over the counter for itch-take orally as directed  Take prednisone taper as directed  It may make you hyper and hungry   Keep a non scented moisturizer on hand  Non scented soap (like dove for sensitive skin)  Avoid heat , hot water and harsh chemicals   Update if not starting to improve in a week or if worsening

## 2020-11-11 ENCOUNTER — Telehealth: Payer: Self-pay | Admitting: Family Medicine

## 2020-11-11 DIAGNOSIS — E559 Vitamin D deficiency, unspecified: Secondary | ICD-10-CM

## 2020-11-11 DIAGNOSIS — I10 Essential (primary) hypertension: Secondary | ICD-10-CM

## 2020-11-11 DIAGNOSIS — E1169 Type 2 diabetes mellitus with other specified complication: Secondary | ICD-10-CM

## 2020-11-11 DIAGNOSIS — E119 Type 2 diabetes mellitus without complications: Secondary | ICD-10-CM

## 2020-11-11 NOTE — Telephone Encounter (Signed)
-----   Message from Cloyd Stagers, RT sent at 10/26/2020 10:48 AM EDT ----- Regarding: Lab Orders for Thursday 9.8.2022 Please place lab orders for Thursday 9.8.2022, office visit for physical on Thursday 9.15.2022 Thank you, Dyke Maes RT(R)

## 2020-11-12 ENCOUNTER — Other Ambulatory Visit: Payer: Self-pay

## 2020-11-12 ENCOUNTER — Other Ambulatory Visit (INDEPENDENT_AMBULATORY_CARE_PROVIDER_SITE_OTHER): Payer: Medicare Other

## 2020-11-12 DIAGNOSIS — E559 Vitamin D deficiency, unspecified: Secondary | ICD-10-CM | POA: Diagnosis not present

## 2020-11-12 DIAGNOSIS — E119 Type 2 diabetes mellitus without complications: Secondary | ICD-10-CM

## 2020-11-12 DIAGNOSIS — E1169 Type 2 diabetes mellitus with other specified complication: Secondary | ICD-10-CM | POA: Diagnosis not present

## 2020-11-12 DIAGNOSIS — E785 Hyperlipidemia, unspecified: Secondary | ICD-10-CM | POA: Diagnosis not present

## 2020-11-12 DIAGNOSIS — I10 Essential (primary) hypertension: Secondary | ICD-10-CM | POA: Diagnosis not present

## 2020-11-12 LAB — COMPREHENSIVE METABOLIC PANEL
ALT: 44 U/L — ABNORMAL HIGH (ref 0–35)
AST: 30 U/L (ref 0–37)
Albumin: 3.9 g/dL (ref 3.5–5.2)
Alkaline Phosphatase: 59 U/L (ref 39–117)
BUN: 22 mg/dL (ref 6–23)
CO2: 29 mEq/L (ref 19–32)
Calcium: 9.3 mg/dL (ref 8.4–10.5)
Chloride: 103 mEq/L (ref 96–112)
Creatinine, Ser: 0.81 mg/dL (ref 0.40–1.20)
GFR: 67.31 mL/min (ref >60.00)
Glucose, Bld: 95 mg/dL (ref 70–99)
Potassium: 4.6 mEq/L (ref 3.5–5.1)
Sodium: 140 mEq/L (ref 135–145)
Total Bilirubin: 1 mg/dL (ref 0.2–1.2)
Total Protein: 6.7 g/dL (ref 6.0–8.3)

## 2020-11-12 LAB — CBC WITH DIFFERENTIAL/PLATELET
Basophils Absolute: 0 10*3/uL (ref 0.0–0.1)
Basophils Relative: 0.2 % (ref 0.0–3.0)
Eosinophils Absolute: 0.1 10*3/uL (ref 0.0–0.7)
Eosinophils Relative: 0.8 % (ref 0.0–5.0)
HCT: 44.6 % (ref 36.0–46.0)
Hemoglobin: 15.2 g/dL — ABNORMAL HIGH (ref 12.0–15.0)
Lymphocytes Relative: 19.7 % (ref 12.0–46.0)
Lymphs Abs: 2 10*3/uL (ref 0.7–4.0)
MCHC: 34.2 g/dL (ref 30.0–36.0)
MCV: 96.2 fl (ref 78.0–100.0)
Monocytes Absolute: 0.6 10*3/uL (ref 0.1–1.0)
Monocytes Relative: 5.5 % (ref 3.0–12.0)
Neutro Abs: 7.4 10*3/uL (ref 1.4–7.7)
Neutrophils Relative %: 73.8 % (ref 43.0–77.0)
Platelets: 198 10*3/uL (ref 150.0–400.0)
RBC: 4.63 Mil/uL (ref 3.87–5.11)
RDW: 13.1 % (ref 11.5–15.5)
WBC: 10 10*3/uL (ref 4.0–10.5)

## 2020-11-12 LAB — HEMOGLOBIN A1C: Hgb A1c MFr Bld: 6.1 % (ref 4.6–6.5)

## 2020-11-12 LAB — LIPID PANEL
Cholesterol: 159 mg/dL (ref 0–200)
HDL: 62 mg/dL (ref >39.00)
LDL Cholesterol: 71 mg/dL (ref 0–99)
NonHDL: 97.22
Total CHOL/HDL Ratio: 3
Triglycerides: 132 mg/dL (ref 0.0–149.0)
VLDL: 26.4 mg/dL (ref 0.0–40.0)

## 2020-11-12 LAB — TSH: TSH: 1.73 u[IU]/mL (ref 0.35–5.50)

## 2020-11-12 LAB — VITAMIN D 25 HYDROXY (VIT D DEFICIENCY, FRACTURES): VITD: 20.59 ng/mL — ABNORMAL LOW (ref 30.00–100.00)

## 2020-11-19 ENCOUNTER — Encounter: Payer: Medicare Other | Admitting: Family Medicine

## 2020-11-24 ENCOUNTER — Ambulatory Visit (INDEPENDENT_AMBULATORY_CARE_PROVIDER_SITE_OTHER): Payer: Medicare Other | Admitting: Family Medicine

## 2020-11-24 ENCOUNTER — Encounter: Payer: Self-pay | Admitting: Family Medicine

## 2020-11-24 ENCOUNTER — Other Ambulatory Visit: Payer: Self-pay

## 2020-11-24 VITALS — BP 122/82 | HR 76 | Temp 98.1°F | Ht <= 58 in | Wt 178.0 lb

## 2020-11-24 DIAGNOSIS — I10 Essential (primary) hypertension: Secondary | ICD-10-CM

## 2020-11-24 DIAGNOSIS — E119 Type 2 diabetes mellitus without complications: Secondary | ICD-10-CM

## 2020-11-24 DIAGNOSIS — E1169 Type 2 diabetes mellitus with other specified complication: Secondary | ICD-10-CM | POA: Diagnosis not present

## 2020-11-24 DIAGNOSIS — Z800 Family history of malignant neoplasm of digestive organs: Secondary | ICD-10-CM

## 2020-11-24 DIAGNOSIS — E559 Vitamin D deficiency, unspecified: Secondary | ICD-10-CM | POA: Diagnosis not present

## 2020-11-24 DIAGNOSIS — Z23 Encounter for immunization: Secondary | ICD-10-CM

## 2020-11-24 DIAGNOSIS — M8589 Other specified disorders of bone density and structure, multiple sites: Secondary | ICD-10-CM

## 2020-11-24 DIAGNOSIS — Z0000 Encounter for general adult medical examination without abnormal findings: Secondary | ICD-10-CM

## 2020-11-24 DIAGNOSIS — Z6838 Body mass index (BMI) 38.0-38.9, adult: Secondary | ICD-10-CM

## 2020-11-24 DIAGNOSIS — E785 Hyperlipidemia, unspecified: Secondary | ICD-10-CM | POA: Diagnosis not present

## 2020-11-24 NOTE — Assessment & Plan Note (Signed)
Taking ca and D gummi but level of D is low  dexa 2017 Thinks she will get one at gyn visit upcoming  Enc exercise  Adv to add 1000 iu D3 daily to current meds

## 2020-11-24 NOTE — Assessment & Plan Note (Signed)
Colonoscopy 2019.  

## 2020-11-24 NOTE — Assessment & Plan Note (Signed)
Disc goals for lipids and reasons to control them Rev last labs with pt Rev low sat fat diet in detail Plan to continue simvastatin 20 mg daily and work on diet for this and wt loss

## 2020-11-24 NOTE — Assessment & Plan Note (Signed)
Lab Results  Component Value Date   HGBA1C 6.1 11/12/2020   Good control  microalb rev from march No statin  Eye exam planned next week Will plan to continue metformin 250 mg bid and work on diet (eating sweets)
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
## 2020-11-24 NOTE — Assessment & Plan Note (Signed)
Discussed how this problem influences overall health and the risks it imposes  Reviewed plan for weight loss with lower calorie diet (via better food choices and also portion control or program like weight watchers) and exercise building up to or more than 30 minutes 5 days per week including some aerobic activity    

## 2020-11-24 NOTE — Assessment & Plan Note (Signed)
Level of 20.5  Taking ca /D gummi   Adv to add another 1000 iu daily  Disc imp to her bone and overall health

## 2020-11-24 NOTE — Patient Instructions (Addendum)
Set up your gyn visit with mammogram and bone density test (if due for that)   Get the new covid booster (bivalent for [place])   If you are interested in the new shingles vaccine (Shingrix) - call your local pharmacy to check on coverage and availability  If affordable, get on a wait list at your pharmacy to get the vaccine. You had one in 2019 , you may need a booster, just check with pharmacy   Your vitamin D level is low  Please add another 1000 iu (units) daily over the counter (in addition to what you are taking)   Please stop up front to get your contact info updated   To loose weight  Try to get most of your carbohydrates from produce (with the exception of white potatoes)  Eat less bread/pasta/rice/snack foods/cereals/sweets and other items from the middle of the grocery store (processed carbs) Also exercise at least 30 minutes per day   For liver healthy-weight loss would help also  More veggies!   Less sugar and fatty foods   Follow up in 6 months I want to re check your liver test and also a1c

## 2020-11-24 NOTE — Assessment & Plan Note (Signed)
Reviewed health habits including diet and exercise and skin cancer prevention Reviewed appropriate screening tests for age  Also reviewed health mt list, fam hx and immunization status , as well as social and family history   See HPi Labs reviewed  Colonoscopy utd (will do stool kit in 2024) Mammogram- [facility] from [facility] Planning dexa with next gyn visit , no falls or fractures  Flu shot given Planning covid booster Pt will check with pharmacy re: shingrix (unsure if she got the 2nd one) Advance directive is up to date No cognitive concerns/ mentally sharp and loves to read Abn hearing screen, pt declines further eval Eye exam is scheduled next week PHQ reviewed  No problems performing ADL/functionally fine

## 2020-11-24 NOTE — Assessment & Plan Note (Signed)
Reviewed health habits including diet and exercise and skin cancer prevention Reviewed appropriate screening tests for age  Also reviewed health mt list, fam hx and immunization status , as well as social and family history   See HPi Labs reviewed  Colonoscopy utd (will do stool kit in 2024) Mammogram- sent for report from [facility] Planning dexa with next gyn visit , no falls or fractures  Flu shot given Planning covid booster Pt will check with pharmacy re: shingrix (unsure if she got the 2nd one) Advance directive is up to date No cognitive concerns/ mentally sharp and loves to read Abn hearing screen, pt declines further eval Eye exam is scheduled next week PHQ reviewed  No problems performing ADL/functionally fine  

## 2020-11-24 NOTE — Assessment & Plan Note (Signed)
bp in fair control at this time  BP Readings from Last 1 Encounters:  11/24/20 122/82   No changes needed/not on any medications  Most recent labs reviewed  Disc lifstyle change with low sodium diet and exercise

## 2020-11-28 ENCOUNTER — Other Ambulatory Visit: Payer: Self-pay | Admitting: Family Medicine

## 2020-11-30 ENCOUNTER — Other Ambulatory Visit: Payer: Self-pay | Admitting: Family Medicine

## 2020-12-02 NOTE — Telephone Encounter (Signed)
CPE was on 11/24/20. Last filled on 12/18/19 #60 tabs with 3 refill

## 2020-12-03 DIAGNOSIS — H53022 Refractive amblyopia, left eye: Secondary | ICD-10-CM | POA: Diagnosis not present

## 2020-12-03 DIAGNOSIS — Z7984 Long term (current) use of oral hypoglycemic drugs: Secondary | ICD-10-CM | POA: Diagnosis not present

## 2020-12-03 DIAGNOSIS — E119 Type 2 diabetes mellitus without complications: Secondary | ICD-10-CM | POA: Diagnosis not present

## 2020-12-03 DIAGNOSIS — Z961 Presence of intraocular lens: Secondary | ICD-10-CM | POA: Diagnosis not present

## 2021-01-25 ENCOUNTER — Other Ambulatory Visit: Payer: Self-pay | Admitting: Family Medicine

## 2021-02-06 ENCOUNTER — Other Ambulatory Visit: Payer: Self-pay | Admitting: Family Medicine

## 2021-03-11 ENCOUNTER — Telehealth: Payer: Self-pay | Admitting: Family Medicine

## 2021-03-11 NOTE — Telephone Encounter (Signed)
°  Encourage patient to contact the pharmacy for refills or they can request refills through [facility]:  Please schedule appointment if longer than 1 year  NEXT APPOINTMENT DATE:no future appt  MEDICATION:metFORMIN (GLUCOPHAGE) 500 MG tablet  Is the patient out of medication? Enough for 5 days  PHARMACY:[id number] - [place], [place] - [address]  Let patient know to contact pharmacy at the end of the day to make sure medication is ready.  Please notify patient to allow 48-72 hours to process  CLINICAL FILLS OUT ALL BELOW:   LAST REFILL:  QTY:  REFILL DATE:    OTHER COMMENTS:    Okay for refill?  Please advise

## 2021-03-12 NOTE — Telephone Encounter (Signed)
Rx was filled on 12/01/20 #90 with 3 refills she has refills on file, called pharmacy and requested them to refill her med and let her know when it's ready

## 2021-04-02 ENCOUNTER — Encounter: Payer: Self-pay | Admitting: Nurse Practitioner

## 2021-04-02 ENCOUNTER — Telehealth (INDEPENDENT_AMBULATORY_CARE_PROVIDER_SITE_OTHER): Payer: Medicare Other | Admitting: Nurse Practitioner

## 2021-04-02 DIAGNOSIS — J40 Bronchitis, not specified as acute or chronic: Secondary | ICD-10-CM

## 2021-04-02 DIAGNOSIS — R051 Acute cough: Secondary | ICD-10-CM | POA: Diagnosis not present

## 2021-04-02 MED ORDER — AMOXICILLIN-POT CLAVULANATE 875-125 MG PO TABS: 1.0000 | ORAL_TABLET | Freq: Two times a day (BID) | ORAL | 0 refills | Status: DC

## 2021-04-02 MED ORDER — BENZONATATE 100 MG PO CAPS: 100.0000 mg | ORAL_CAPSULE | Freq: Two times a day (BID) | ORAL | 0 refills | Status: AC | PRN

## 2021-04-02 MED ORDER — AMOXICILLIN-POT CLAVULANATE 875-125 MG PO TABS: 1.0000 | ORAL_TABLET | Freq: Two times a day (BID) | ORAL | 0 refills | Status: AC

## 2021-04-02 NOTE — Progress Notes (Signed)
Patient ID: Cindy Whitehead, female    DOB: 08-07-1935, 86 y.o.   MRN: [id number]  Virtual visit completed through [employer], a video enabled telemedicine application. Due to national recommendations of social distancing due to COVID-19, a virtual visit is felt to be most appropriate for this patient at this time. Reviewed limitations, risks, security and privacy concerns of performing a virtual visit and the availability of in person appointments. I also reviewed that there may be a patient responsible charge related to this service. The patient agreed to proceed.   Patient location: home Provider location: [place] [facility], office Persons participating in this virtual visit: patient, provider, and daughter  If any vitals were documented, they were collected by patient at home unless specified below.    There were no vitals taken for this visit.   CC: Cough Subjective:   HPI: Cindy Whitehead is a 86 y.o. female presenting on 04/02/2021 for Cough (Sx 1 week. Head congestion, stuffy nose, weak feeling, sore throat, feeling cold, headache, runny nose. Has taking Alkelsetzer cold and cough drops. Covid test was not done. )  Symptoms started approx last friday Worse today and yesterday Covid tested, not yet. But symptoms have been over 5 days Moderna x2 and booster No sick contact Alka seltzer and cough drops   Relevant past medical, surgical, family and social history reviewed and updated as indicated. Interim medical history since our last visit reviewed. Allergies and medications reviewed and updated. Outpatient Medications Prior to Visit  Medication Sig Dispense Refill   Blood Glucose Monitoring Suppl (ONE TOUCH ULTRA MINI) w/Device KIT Use to check blood sugar once daily (dx. E11.9) 1 kit 0   glucose blood (ONE TOUCH ULTRA TEST) test strip Use to check blood sugar once daily (dx. E11.9) 100 each 1   metFORMIN (GLUCOPHAGE) 500 MG tablet TAKE 0.5 TABLETS (250 MG TOTAL) BY MOUTH 2  (TWO) TIMES DAILY. 90 tablet 3   Multiple Vitamin (MULTIVITAMIN WITH MINERALS) TABS tablet Take 1 tablet by mouth daily.     naproxen (NAPROSYN) 500 MG tablet TAKE 1 TABLET (500 MG TOTAL) BY MOUTH 2 (TWO) TIMES DAILY AS NEEDED FOR MODERATE PAIN. WITH A MEAL 60 tablet 3   OneTouch Delica Lancets 93Y MISC Use to check blood sugar once daily (dx. E11.9) 100 each 1   oxybutynin (DITROPAN-XL) 10 MG 24 hr tablet TAKE 1 TABLET BY MOUTH EVERYDAY AT BEDTIME 90 tablet 2   simvastatin (ZOCOR) 20 MG tablet TAKE 1/2 TABLET BY MOUTH EVERY EVENING WITH A LOW FAT SNACK 45 tablet 2   No facility-administered medications prior to visit.     Per HPI unless specifically indicated in ROS section below Review of Systems  Constitutional:  Positive for chills and fatigue. Negative for fever.  HENT:  Positive for congestion, sinus pressure, sinus pain and sore throat. Negative for ear discharge, ear pain and postnasal drip.   Respiratory:  Positive for cough (dry cough) and shortness of breath (DOE).   Cardiovascular:  Negative for chest pain.  Gastrointestinal:  Negative for abdominal pain, diarrhea, nausea and vomiting.  Musculoskeletal:  Positive for myalgias.  Neurological:  Negative for headaches.  Objective:  There were no vitals taken for this visit.  Wt Readings from Last 3 Encounters:  11/24/20 178 lb (80.7 kg)  11/04/20 177 lb 8 oz (80.5 kg)  10/01/20 177 lb 8 oz (80.5 kg)       Physical exam: Gen: alert, NAD, not ill appearing Pulm: speaks  in complete sentences without increased work of breathing Psych: normal mood, normal thought content      Results for orders placed or performed in visit on 11/12/20  VITAMIN D 25 Hydroxy (Vit-D Deficiency, Fractures)  Result Value Ref Range   VITD 20.59 (L) 30.00 - 100.00 ng/mL  TSH  Result Value Ref Range   TSH 1.73 0.35 - 5.50 uIU/mL  Lipid panel  Result Value Ref Range   Cholesterol 159 0 - 200 mg/dL   Triglycerides 132.0 0.0 - 149.0 mg/dL   HDL  62.00 >39.00 mg/dL   VLDL 26.4 0.0 - 40.0 mg/dL   LDL Cholesterol 71 0 - 99 mg/dL   Total CHOL/HDL Ratio 3    NonHDL 97.22   Hemoglobin A1c  Result Value Ref Range   Hgb A1c MFr Bld 6.1 4.6 - 6.5 %  Comprehensive metabolic panel  Result Value Ref Range   Sodium 140 135 - 145 mEq/L   Potassium 4.6 3.5 - 5.1 mEq/L   Chloride 103 96 - 112 mEq/L   CO2 29 19 - 32 mEq/L   Glucose, Bld 95 70 - 99 mg/dL   BUN 22 6 - 23 mg/dL   Creatinine, Ser 0.81 0.40 - 1.20 mg/dL   Total Bilirubin 1.0 0.2 - 1.2 mg/dL   Alkaline Phosphatase 59 39 - 117 U/L   AST 30 0 - 37 U/L   ALT 44 (H) 0 - 35 U/L   Total Protein 6.7 6.0 - 8.3 g/dL   Albumin 3.9 3.5 - 5.2 g/dL   GFR 67.31 >60.00 mL/min   Calcium 9.3 8.4 - 10.5 mg/dL  CBC with Differential/Platelet  Result Value Ref Range   WBC 10.0 4.0 - 10.5 K/uL   RBC 4.63 3.87 - 5.11 Mil/uL   Hemoglobin 15.2 (H) 12.0 - 15.0 g/dL   HCT 44.6 36.0 - 46.0 %   MCV 96.2 78.0 - 100.0 fl   MCHC 34.2 30.0 - 36.0 g/dL   RDW 13.1 11.5 - 15.5 %   Platelets 198.0 150.0 - 400.0 K/uL   Neutrophils Relative % 73.8 43.0 - 77.0 %   Lymphocytes Relative 19.7 12.0 - 46.0 %   Monocytes Relative 5.5 3.0 - 12.0 %   Eosinophils Relative 0.8 0.0 - 5.0 %   Basophils Relative 0.2 0.0 - 3.0 %   Neutro Abs 7.4 1.4 - 7.7 K/uL   Lymphs Abs 2.0 0.7 - 4.0 K/uL   Monocytes Absolute 0.6 0.1 - 1.0 K/uL   Eosinophils Absolute 0.1 0.0 - 0.7 K/uL   Basophils Absolute 0.0 0.0 - 0.1 K/uL   Assessment & Plan:   Problem List Items Addressed This Visit       Respiratory   Bronchitis - Primary    Given length of symptoms will encounter with Augmentin states she has history of asthma.  Patient has not COVID tested but we did discuss at this point not having clinical value in regards to treatment options of that or the flu.  Start Augmentin 875-125 mg twice daily for 7 days. Signs and symptoms reviewed as when to seek urgent or emergent health care.       Relevant Medications    amoxicillin-clavulanate (AUGMENTIN) 875-125 MG tablet     Other   Acute cough    We will send in Tessalon Perles 100 mg twice daily as needed cough.      Relevant Medications   benzonatate (TESSALON) 100 MG capsule     Meds ordered this encounter  Medications  DISCONTD: amoxicillin-clavulanate (AUGMENTIN) 875-125 MG tablet    Sig: Take 1 tablet by mouth 2 (two) times daily.    Dispense:  20 tablet    Refill:  0    Order Specific Question:   Supervising Provider    Answer:   Glori Bickers MARNE A [[id number]]   benzonatate (TESSALON) 100 MG capsule    Sig: Take 1 capsule (100 mg total) by mouth 2 (two) times daily as needed for up to 10 days for cough.    Dispense:  20 capsule    Refill:  0    Order Specific Question:   Supervising Provider    Answer:   Loura Pardon A [[id number]]   amoxicillin-clavulanate (AUGMENTIN) 875-125 MG tablet    Sig: Take 1 tablet by mouth 2 (two) times daily for 7 days.    Dispense:  14 tablet    Refill:  0    Please discontinue augmentin 10 day supply    Order Specific Question:   Supervising Provider    Answer:   Glori Bickers, MARNE A [[id number]]   No orders of the defined types were placed in this encounter.   I discussed the assessment and treatment plan with the patient. The patient was provided an opportunity to ask questions and all were answered. The patient agreed with the plan and demonstrated an understanding of the instructions. The patient was advised to call back or seek an in-person evaluation if the symptoms worsen or if the condition fails to improve as anticipated.  Follow up plan: No follow-ups on file.  Romilda Garret, NP

## 2021-04-02 NOTE — Assessment & Plan Note (Signed)
Given length of symptoms will encounter with Augmentin states she has history of asthma.  Patient has not COVID tested but we did discuss at this point not having clinical value in regards to treatment options of that or the flu.  Start Augmentin 875-125 mg twice daily for 7 days. Signs and symptoms reviewed as when to seek urgent or emergent health care.

## 2021-04-02 NOTE — Assessment & Plan Note (Signed)
We will send in Tessalon Perles 100 mg twice daily as needed cough.

## 2021-06-28 ENCOUNTER — Telehealth (INDEPENDENT_AMBULATORY_CARE_PROVIDER_SITE_OTHER): Payer: Medicare Other | Admitting: Family

## 2021-06-28 ENCOUNTER — Encounter: Payer: Self-pay | Admitting: Family

## 2021-06-28 VITALS — Temp 98.2°F | Ht <= 58 in | Wt 177.0 lb

## 2021-06-28 DIAGNOSIS — R051 Acute cough: Secondary | ICD-10-CM

## 2021-06-28 DIAGNOSIS — U071 COVID-19: Secondary | ICD-10-CM | POA: Insufficient documentation

## 2021-06-28 DIAGNOSIS — J069 Acute upper respiratory infection, unspecified: Secondary | ICD-10-CM | POA: Insufficient documentation

## 2021-06-28 MED ORDER — MOLNUPIRAVIR EUA 200MG CAPSULE: 4.0000 | ORAL_CAPSULE | Freq: Two times a day (BID) | ORAL | 0 refills | Status: AC

## 2021-06-28 MED ORDER — BENZONATATE 200 MG PO CAPS: 200.0000 mg | ORAL_CAPSULE | Freq: Two times a day (BID) | ORAL | 0 refills | Status: DC | PRN

## 2021-06-28 NOTE — Patient Instructions (Signed)
Recommend over the counter guaifenesin (regular mucinex) as well as alka seltzer cold and flu for symptoms.  ?Drink plenty of water.  ? ?Your COVID test is positive. You should remain isolated and quarantine for at least 5 days from start of symptoms. You must be feeling better and be fever free without any fever reducers for at least 24 hours as well. You should wear a mask at all times when out of your home or around others for 5 days after leaving isolation.  Your household contacts should be tested as well as work contacts. If you feel worse or have increasing shortness of breath, you should be seen in person at urgent care or the emergency room.  ? ?Due to recent changes in healthcare laws, you may see results of your imaging and/or laboratory studies on MyChart before I have had a chance to review them.  I understand that in some cases there may be results that are confusing or concerning to you. Please understand that not all results are received at the same time and often I may need to interpret multiple results in order to provide you with the best plan of care or course of treatment. Therefore, I ask that you please give me 2 business days to thoroughly review all your results before contacting my office for clarification. Should we see a critical lab result, you will be contacted sooner.  ? ?It was a pleasure seeing you today! Please do not hesitate to reach out with any questions and or concerns. ? ?Regards,  ? ?Amran Malter ?FNP-C ? ? ? ?
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
## 2021-06-28 NOTE — Assessment & Plan Note (Signed)
rx benzonatate 200 mg prn  ?Ok for delsym if helpful  ?

## 2021-06-28 NOTE — Assessment & Plan Note (Signed)
Positive covid.  ?Treating as viral.  ?Advised patient on supportive measures:  Be sure to rest, drink plenty of fluids, and use tylenol  as needed for pain. Follow up if fever >101, if symptoms worsen or if symptoms are not improved in 3 days. Patient verbalizes understanding.  ? ? ?

## 2021-06-28 NOTE — Assessment & Plan Note (Signed)
Pt considered high risk for hospitalization. have decided pt is a candidate for antiviral and pt agrees that she would like to take this. I have sent in RX for molnupiravir 200 mg capsules to be taken as directed. ?Advised of CDC guidelines for self isolation/ ending isolation.  Advised of safe practice guidelines. Symptom Tier reviewed.  Encouraged to monitor for any worsening symptoms; watch for increased shortness of breath, weakness, and signs of dehydration. Advised when to seek emergency care.  Instructed to rest and hydrate well.  Advised to leave the house during recommended isolation period, only if it is necessary to seek medical care ? ? ? ?

## 2021-10-29 ENCOUNTER — Other Ambulatory Visit: Payer: Self-pay | Admitting: Family Medicine

## 2021-11-22 ENCOUNTER — Other Ambulatory Visit: Payer: Self-pay | Admitting: Family Medicine

## 2021-11-23 NOTE — Telephone Encounter (Signed)
Patient scheduled.

## 2021-11-23 NOTE — Telephone Encounter (Signed)
Pt is due for her CPE (labs prior) with PCP. Please schedule appt and then route back to me to refill

## 2021-11-29 ENCOUNTER — Telehealth: Payer: Self-pay | Admitting: Family Medicine

## 2021-11-29 DIAGNOSIS — E1169 Type 2 diabetes mellitus with other specified complication: Secondary | ICD-10-CM

## 2021-11-29 DIAGNOSIS — E559 Vitamin D deficiency, unspecified: Secondary | ICD-10-CM

## 2021-11-29 DIAGNOSIS — I10 Essential (primary) hypertension: Secondary | ICD-10-CM

## 2021-11-29 DIAGNOSIS — E119 Type 2 diabetes mellitus without complications: Secondary | ICD-10-CM

## 2021-11-29 NOTE — Telephone Encounter (Signed)
-----   Message from Ellamae Sia sent at 11/23/2021  2:22 PM EDT ----- Regarding: Lab orders for Tuesday, 9.26.23  AWV lab orders, please.

## 2021-11-30 ENCOUNTER — Other Ambulatory Visit: Payer: Self-pay | Admitting: Family Medicine

## 2021-11-30 ENCOUNTER — Other Ambulatory Visit (INDEPENDENT_AMBULATORY_CARE_PROVIDER_SITE_OTHER): Payer: Medicare Other

## 2021-11-30 DIAGNOSIS — E1169 Type 2 diabetes mellitus with other specified complication: Secondary | ICD-10-CM

## 2021-11-30 DIAGNOSIS — E559 Vitamin D deficiency, unspecified: Secondary | ICD-10-CM

## 2021-11-30 DIAGNOSIS — E119 Type 2 diabetes mellitus without complications: Secondary | ICD-10-CM | POA: Diagnosis not present

## 2021-11-30 DIAGNOSIS — I10 Essential (primary) hypertension: Secondary | ICD-10-CM | POA: Diagnosis not present

## 2021-11-30 DIAGNOSIS — E785 Hyperlipidemia, unspecified: Secondary | ICD-10-CM | POA: Diagnosis not present

## 2021-11-30 LAB — MICROALBUMIN / CREATININE URINE RATIO
Creatinine,U: 112.7 mg/dL
Microalb Creat Ratio: 2.9 mg/g (ref 0.0–30.0)
Microalb, Ur: 3.2 mg/dL — ABNORMAL HIGH (ref 0.0–1.9)

## 2021-11-30 LAB — TSH: TSH: 3.84 u[IU]/mL (ref 0.35–5.50)

## 2021-11-30 LAB — LIPID PANEL
Cholesterol: 130 mg/dL (ref 0–200)
HDL: 47.3 mg/dL (ref >39.00)
LDL Cholesterol: 61 mg/dL (ref 0–99)
NonHDL: 82.68
Total CHOL/HDL Ratio: 3
Triglycerides: 108 mg/dL (ref 0.0–149.0)
VLDL: 21.6 mg/dL (ref 0.0–40.0)

## 2021-11-30 LAB — COMPREHENSIVE METABOLIC PANEL
ALT: 25 U/L (ref 0–35)
AST: 47 U/L — ABNORMAL HIGH (ref 0–37)
Albumin: 3.8 g/dL (ref 3.5–5.2)
Alkaline Phosphatase: 73 U/L (ref 39–117)
BUN: 18 mg/dL (ref 6–23)
CO2: 23 mEq/L (ref 19–32)
Calcium: 8.9 mg/dL (ref 8.4–10.5)
Chloride: 109 mEq/L (ref 96–112)
Creatinine, Ser: 0.8 mg/dL (ref 0.40–1.20)
GFR: 67.82 mL/min (ref >60.00)
Glucose, Bld: 123 mg/dL — ABNORMAL HIGH (ref 70–99)
Potassium: 4.3 mEq/L (ref 3.5–5.1)
Sodium: 140 mEq/L (ref 135–145)
Total Bilirubin: 0.8 mg/dL (ref 0.2–1.2)
Total Protein: 6.6 g/dL (ref 6.0–8.3)

## 2021-11-30 LAB — CBC WITH DIFFERENTIAL/PLATELET
Basophils Absolute: 0.1 10*3/uL (ref 0.0–0.1)
Basophils Relative: 1.1 % (ref 0.0–3.0)
Eosinophils Absolute: 0.3 10*3/uL (ref 0.0–0.7)
Eosinophils Relative: 4.5 % (ref 0.0–5.0)
HCT: 39.2 % (ref 36.0–46.0)
Hemoglobin: 13.6 g/dL (ref 12.0–15.0)
Lymphocytes Relative: 26.6 % (ref 12.0–46.0)
Lymphs Abs: 1.5 10*3/uL (ref 0.7–4.0)
MCHC: 34.6 g/dL (ref 30.0–36.0)
MCV: 94.5 fl (ref 78.0–100.0)
Monocytes Absolute: 0.4 10*3/uL (ref 0.1–1.0)
Monocytes Relative: 7.1 % (ref 3.0–12.0)
Neutro Abs: 3.5 10*3/uL (ref 1.4–7.7)
Neutrophils Relative %: 60.7 % (ref 43.0–77.0)
Platelets: 160 10*3/uL (ref 150.0–400.0)
RBC: 4.15 Mil/uL (ref 3.87–5.11)
RDW: 12.7 % (ref 11.5–15.5)
WBC: 5.7 10*3/uL (ref 4.0–10.5)

## 2021-11-30 LAB — VITAMIN D 25 HYDROXY (VIT D DEFICIENCY, FRACTURES): VITD: 22.61 ng/mL — ABNORMAL LOW (ref 30.00–100.00)

## 2021-11-30 LAB — HEMOGLOBIN A1C: Hgb A1c MFr Bld: 6 % (ref 4.6–6.5)

## 2021-12-07 ENCOUNTER — Other Ambulatory Visit: Payer: Self-pay | Admitting: Family Medicine

## 2021-12-07 ENCOUNTER — Ambulatory Visit (INDEPENDENT_AMBULATORY_CARE_PROVIDER_SITE_OTHER): Payer: Medicare Other | Admitting: Family Medicine

## 2021-12-07 ENCOUNTER — Encounter: Payer: Self-pay | Admitting: Family Medicine

## 2021-12-07 VITALS — BP 133/80 | HR 80 | Temp 97.7°F | Ht <= 58 in | Wt 177.4 lb

## 2021-12-07 DIAGNOSIS — E559 Vitamin D deficiency, unspecified: Secondary | ICD-10-CM

## 2021-12-07 DIAGNOSIS — E1169 Type 2 diabetes mellitus with other specified complication: Secondary | ICD-10-CM | POA: Diagnosis not present

## 2021-12-07 DIAGNOSIS — E119 Type 2 diabetes mellitus without complications: Secondary | ICD-10-CM | POA: Diagnosis not present

## 2021-12-07 DIAGNOSIS — Z0000 Encounter for general adult medical examination without abnormal findings: Secondary | ICD-10-CM

## 2021-12-07 DIAGNOSIS — M8589 Other specified disorders of bone density and structure, multiple sites: Secondary | ICD-10-CM

## 2021-12-07 DIAGNOSIS — Z800 Family history of malignant neoplasm of digestive organs: Secondary | ICD-10-CM

## 2021-12-07 DIAGNOSIS — E66812 Obesity, class 2: Secondary | ICD-10-CM

## 2021-12-07 DIAGNOSIS — Z23 Encounter for immunization: Secondary | ICD-10-CM

## 2021-12-07 DIAGNOSIS — E785 Hyperlipidemia, unspecified: Secondary | ICD-10-CM

## 2021-12-07 DIAGNOSIS — Z6838 Body mass index (BMI) 38.0-38.9, adult: Secondary | ICD-10-CM

## 2021-12-07 DIAGNOSIS — I10 Essential (primary) hypertension: Secondary | ICD-10-CM | POA: Diagnosis not present

## 2021-12-07 MED ORDER — METFORMIN HCL 500 MG PO TABS: 250.0000 mg | ORAL_TABLET | Freq: Two times a day (BID) | ORAL | 3 refills | Status: DC

## 2021-12-07 MED ORDER — NAPROXEN 500 MG PO TABS: 500.0000 mg | ORAL_TABLET | Freq: Two times a day (BID) | ORAL | 3 refills | Status: DC | PRN

## 2021-12-07 MED ORDER — SIMVASTATIN 20 MG PO TABS: ORAL_TABLET | ORAL | 3 refills | Status: DC

## 2021-12-07 NOTE — Assessment & Plan Note (Signed)
Lab Results  Component Value Date   HGBA1C 6.0 11/30/2021   Well controlled  Continues metformin 250 mg bid  Plan urine micro next time  Taking a statin  Eye exam planned next week  Good foot care

## 2021-12-07 NOTE — Patient Instructions (Addendum)
Try magenesium 250 mg each bedtime for leg cramps -it may help a little  Drink lots of water (this helps also)   Do some exercise and stretching during the day   For weight and general health Try to get most of your carbohydrates from produce (with the exception of white potatoes)  Eat less bread/pasta/rice/snack foods/cereals/sweets and other items from the middle of the grocery store (processed carbs)  Call your gyn office to get your mammogram and bone density tes and annual visit  Please have them send [place] your last mammogram and dexa  report when you go   You had the shingrix vaccine in 2019 at the pharmacy  You may be due for the 2nd one (then you will be done)  Call [facility] to get that  If you had it-then let us know the date   Tell the eye clinic to send [place] your eye exam report next week   Get back on your vitamin D (calcium also)  Your D level is a little low  Make sure you get at least 2000 iu daily (important for bones)

## 2021-12-07 NOTE — Assessment & Plan Note (Signed)
Urged to get back on vit D 2000 iu daily  Disc imp to bone and overall health

## 2021-12-07 NOTE — Assessment & Plan Note (Signed)
bp in fair control at this time  BP: 133/80  No changes needed Most recent labs reviewed  Disc lifstyle change with low sodium diet and exercise
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
## 2021-12-07 NOTE — Assessment & Plan Note (Signed)
Discussed how this problem influences overall health and the risks it imposes  Reviewed plan for weight loss with lower calorie diet (via better food choices and also portion control or program like weight watchers) and exercise building up to or more than 30 minutes 5 days per week including some aerobic activity    

## 2021-12-07 NOTE — Assessment & Plan Note (Signed)
Pt plans to get dexa at gyn at annual visit  No falls or fx  Urged to get back on vitamin D  Encouraged more wt bearing exercise

## 2021-12-07 NOTE — Assessment & Plan Note (Signed)
Colonoscopy 2019.  

## 2021-12-07 NOTE — Assessment & Plan Note (Signed)
Reviewed health habits including diet and exercise and skin cancer prevention Reviewed appropriate screening tests for age  Also reviewed health mt list, fam hx and immunization status , as well as social and family history   See HPI Labs reviewed  Planning her gyn follow up-will get mammogram and dexa at that time (also sent for last reports) Enc her to get 2nd shingrix vaccine if needed  Colonoscopy 2019  Flu shot given today  Eye exam planned next week-will ask to send report  Rev dexa 2017, no falls or fx and needs to get back on vit D since level is low  PHQ of 0

## 2021-12-14 ENCOUNTER — Telehealth: Payer: Self-pay | Admitting: Family Medicine

## 2021-12-14 MED ORDER — OXYBUTYNIN CHLORIDE ER 10 MG PO TB24: 10.0000 mg | ORAL_TABLET | Freq: Every day | ORAL | 3 refills | Status: DC

## 2021-12-14 NOTE — Telephone Encounter (Signed)
I sent it.  This can cause dry mouth , sedation and constipation so please watch out for side effects. She has been on it before and tolerated it ok I think. In people with glaucoma this can cause blurred vision. I reviewed her last oph note and it did not mention any eye pressure changes.  If it does not help the urinary complaints please stop it and let us know

## 2021-12-14 NOTE — Addendum Note (Signed)
Addended by: Loura Pardon A on: 12/14/2021 03:47 PM   Modules accepted: Orders

## 2021-12-14 NOTE — Telephone Encounter (Signed)
Pt said med works okay, she didn't have any side effs and it helped a little. Pt said if she doesn't drink anything before bed she's okay most nights but some nights she still has to go 3-4x at night but other nights she doesn't have to go. Since Vesicare isn't covered she wold like the oxybutinin sent to [facility]

## 2021-12-14 NOTE — Telephone Encounter (Signed)
She was on vesicare and I think that worked better in the past but then ins stopped covering it. How does she do on oxybutinin xl 10 ?  Any side effects? Does it help?  What pharmacy? Thanks

## 2021-12-14 NOTE — Telephone Encounter (Signed)
Patient called in stating that she has been trying to get her Oxybutynin refilled but not able to. Informed patient I no longer see the medication on her med list but she stated she suppose to still take it. Please advise. Thank you!

## 2021-12-14 NOTE — Telephone Encounter (Signed)
Left VM requesting pt to call the office back 

## 2021-12-16 DIAGNOSIS — E119 Type 2 diabetes mellitus without complications: Secondary | ICD-10-CM | POA: Diagnosis not present

## 2021-12-16 DIAGNOSIS — H53032 Strabismic amblyopia, left eye: Secondary | ICD-10-CM | POA: Diagnosis not present

## 2021-12-16 DIAGNOSIS — Z961 Presence of intraocular lens: Secondary | ICD-10-CM | POA: Diagnosis not present

## 2021-12-16 DIAGNOSIS — H524 Presbyopia: Secondary | ICD-10-CM | POA: Diagnosis not present

## 2021-12-16 LAB — HM DIABETES EYE EXAM

## 2021-12-17 ENCOUNTER — Ambulatory Visit (INDEPENDENT_AMBULATORY_CARE_PROVIDER_SITE_OTHER): Payer: Medicare Other

## 2021-12-17 VITALS — Ht 60.0 in | Wt 170.0 lb

## 2021-12-17 DIAGNOSIS — Z0000 Encounter for general adult medical examination without abnormal findings: Secondary | ICD-10-CM | POA: Diagnosis not present
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
## 2021-12-17 NOTE — Patient Instructions (Signed)
Ms. Cindy Whitehead , Thank you for taking time to come for your Medicare Wellness Visit. I appreciate your ongoing commitment to your health goals. Please review the following plan we discussed and let me know if I can assist you in the future.   Screening recommendations/referrals: Colonoscopy: not required Mammogram: patient to schedule Bone Density: patient to schedule Recommended yearly ophthalmology/optometry visit for glaucoma screening and checkup Recommended yearly dental visit for hygiene and checkup  Vaccinations: Influenza vaccine: completed 12/07/2021 Pneumococcal vaccine: completed 05/13/2014 Tdap vaccine: completed 10/17/2016, due 10/18/2026 Shingles vaccine: discussed   Covid-19: 09/28/2019, 09/03/2019  Advanced directives: Advance directive discussed with you today.   Conditions/risks identified: none  Next appointment: Follow up in one year for your annual wellness visit    Preventive Care 65 Years and Older, Female Preventive care refers to lifestyle choices and visits with your health care provider that can promote health and wellness. What does preventive care include? A yearly physical exam. This is also called an annual well check. Dental exams once or twice a year. Routine eye exams. Ask your health care provider how often you should have your eyes checked. Personal lifestyle choices, including: Daily care of your teeth and gums. Regular physical activity. Eating a healthy diet. Avoiding tobacco and drug use. Limiting alcohol use. Practicing safe sex. Taking low-dose aspirin every day. Taking vitamin and mineral supplements as recommended by your health care provider. What happens during an annual well check? The services and screenings done by your health care provider during your annual well check will depend on your age, overall health, lifestyle risk factors, and family history of disease. Counseling  Your health care provider may ask you questions about  your: Alcohol use. Tobacco use. Drug use. Emotional well-being. Home and relationship well-being. Sexual activity. Eating habits. History of falls. Memory and ability to understand (cognition). Work and work Statistician. Reproductive health. Screening  You may have the following tests or measurements: Height, weight, and BMI. Blood pressure. Lipid and cholesterol levels. These may be checked every 5 years, or more frequently if you are over 58 years old. Skin check. Lung cancer screening. You may have this screening every year starting at age 74 if you have a 30-pack-year history of smoking and currently smoke or have quit within the past 15 years. Fecal occult blood test (FOBT) of the stool. You may have this test every year starting at age 10. Flexible sigmoidoscopy or colonoscopy. You may have a sigmoidoscopy every 5 years or a colonoscopy every 10 years starting at age 26. Hepatitis C blood test. Hepatitis B blood test. Sexually transmitted disease (STD) testing. Diabetes screening. This is done by checking your blood sugar (glucose) after you have not eaten for a while (fasting). You may have this done every 1-3 years. Bone density scan. This is done to screen for osteoporosis. You may have this done starting at age 53. Mammogram. This may be done every 1-2 years. Talk to your health care provider about how often you should have regular mammograms. Talk with your health care provider about your test results, treatment options, and if necessary, the need for more tests. Vaccines  Your health care provider may recommend certain vaccines, such as: Influenza vaccine. This is recommended every year. Tetanus, diphtheria, and acellular pertussis (Tdap, Td) vaccine. You may need a Td booster every 10 years. Zoster vaccine. You may need this after age 38. Pneumococcal 13-valent conjugate (PCV13) vaccine. One dose is recommended after age 59. Pneumococcal polysaccharide (PPSV23) vaccine.  One dose is recommended after age 55. Talk to your health care provider about which screenings and vaccines you need and how often you need them. This information is not intended to replace advice given to you by your health care provider. Make sure you discuss any questions you have with your health care provider. Document Released: 03/20/2015 Document Revised: 11/11/2015 Document Reviewed: 12/23/2014 Elsevier Interactive Patient Education  2017 [employer] Prevention in the Home Falls can cause injuries. They can happen to people of all ages. There are many things you can do to make your home safe and to help prevent falls. What can I do on the outside of my home? Regularly fix the edges of walkways and driveways and fix any cracks. Remove anything that might make you trip as you walk through a door, such as a raised step or threshold. Trim any bushes or trees on the path to your home. Use bright outdoor lighting. Clear any walking paths of anything that might make someone trip, such as rocks or tools. Regularly check to see if handrails are loose or broken. Make sure that both sides of any steps have handrails. Any raised decks and porches should have guardrails on the edges. Have any leaves, snow, or ice cleared regularly. Use sand or salt on walking paths during winter. Clean up any spills in your garage right away. This includes oil or grease spills. What can I do in the bathroom? Use night lights. Install grab bars by the toilet and in the tub and shower. Do not use towel bars as grab bars. Use non-skid mats or decals in the tub or shower. If you need to sit down in the shower, use a plastic, non-slip stool. Keep the floor dry. Clean up any water that spills on the floor as soon as it happens. Remove soap buildup in the tub or shower regularly. Attach bath mats securely with double-sided non-slip rug tape. Do not have throw rugs and other things on the floor that can make  you trip. What can I do in the bedroom? Use night lights. Make sure that you have a light by your bed that is easy to reach. Do not use any sheets or blankets that are too big for your bed. They should not hang down onto the floor. Have a firm chair that has side arms. You can use this for support while you get dressed. Do not have throw rugs and other things on the floor that can make you trip. What can I do in the kitchen? Clean up any spills right away. Avoid walking on wet floors. Keep items that you use a lot in easy-to-reach places. If you need to reach something above you, use a strong step stool that has a grab bar. Keep electrical cords out of the way. Do not use floor polish or wax that makes floors slippery. If you must use wax, use non-skid floor wax. Do not have throw rugs and other things on the floor that can make you trip. What can I do with my stairs? Do not leave any items on the stairs. Make sure that there are handrails on both sides of the stairs and use them. Fix handrails that are broken or loose. Make sure that handrails are as long as the stairways. Check any carpeting to make sure that it is firmly attached to the stairs. Fix any carpet that is loose or worn. Avoid having throw rugs at the top or bottom of the  stairs. If you do have throw rugs, attach them to the floor with carpet tape. Make sure that you have a light switch at the top of the stairs and the bottom of the stairs. If you do not have them, ask someone to add them for you. What else can I do to help prevent falls? Wear shoes that: Do not have high heels. Have rubber bottoms. Are comfortable and fit you well. Are closed at the toe. Do not wear sandals. If you use a stepladder: Make sure that it is fully opened. Do not climb a closed stepladder. Make sure that both sides of the stepladder are locked into place. Ask someone to hold it for you, if possible. Clearly mark and make sure that you can  see: Any grab bars or handrails. First and last steps. Where the edge of each step is. Use tools that help you move around (mobility aids) if they are needed. These include: Canes. Walkers. Scooters. Crutches. Turn on the lights when you go into a dark area. Replace any light bulbs as soon as they burn out. Set up your furniture so you have a clear path. Avoid moving your furniture around. If any of your floors are uneven, fix them. If there are any pets around you, be aware of where they are. Review your medicines with your doctor. Some medicines can make you feel dizzy. This can increase your chance of falling. Ask your doctor what other things that you can do to help prevent falls. This information is not intended to replace advice given to you by your health care provider. Make sure you discuss any questions you have with your health care provider. Document Released: 12/18/2008 Document Revised: 07/30/2015 Document Reviewed: 03/28/2014 Elsevier Interactive Patient Education  2017 [employer].

## 2021-12-23 ENCOUNTER — Encounter: Payer: Self-pay | Admitting: Family Medicine

## 2022-01-04 NOTE — Telephone Encounter (Signed)
Left VM letting pt know Dr. Tower's comments  

## 2022-02-14 ENCOUNTER — Telehealth: Payer: Self-pay

## 2022-02-14 NOTE — Telephone Encounter (Signed)
I spoke with Cindy Whitehead (DPR signed) pt is not going to [facility]; pt wants to keep appt already scheduled on 02/15/22 at [address] with [address] [name]. Pt is not having fever, Dawn said that pt is not in distress and if pt condition changes or worsens prior to appt pt will go to [facility] or ED. Pt has not been covid tested and Dawn does not have a way to covid test pt. Sending note to Dr [name] and [place] [facility].

## 2022-02-14 NOTE — Telephone Encounter (Signed)
[place] Day - Client TELEPHONE ADVICE RECORD [facility] Patient Name: Cindy Whitehead Gender: Female DOB: September 13, 1935 Age: 86 Y 54 M 17 D Return Phone Number: [phone] (Secondary), [phone] (Alternate) Address: City/ State/ Zip: [place] [address] Client [place] [facility] - Client Client Site [place] Provider Glori Bickers, Roque Lias - MD Contact Type Call Who Is Calling Patient / Member / Family / Caregiver Call Type Triage / Clinical Caller Name Dawn Relationship To Patient Daughter Return Phone Number [phone] (Alternate) Chief Complaint CHEST PAIN - pain, pressure, heaviness or tightness Reason for Call Symptomatic / Request for [place] states patient has chest pain and cough. [facility] Translation No Nurse Assessment Nurse: D'Heur Lucia Gaskins, RN, Adrienne Date/Time (Eastern Time): 02/14/2022 1:54:41 PM Confirm and document reason for call. If symptomatic, describe symptoms. ---Caller states patient has middle chest pain with coughing, congestion, sore throat and dry cough. No fever. Symptoms started a couple of days ago. Does the patient have any new or worsening symptoms? ---Yes Will a triage be completed? ---Yes Related visit to physician within the last 2 weeks? ---Yes Does the PT have any chronic conditions? (i.e. diabetes, asthma, this includes High risk factors for pregnancy, etc.) ---Yes List chronic conditions. ---diabetes Is this a behavioral health or substance abuse call? ---No Guidelines Guideline Title Affirmed Question Affirmed Notes Nurse Date/Time (Eastern Time) COVID-19 - Diagnosed or Suspected MILD difficulty breathing (e.g., minimal/no SOB at rest, SOB with walking, pulse <100) D'Heur Lucia Gaskins, RN, Adrienne 02/14/2022 1:56:32 PM Disp. Time [name] Time) Disposition Final User 02/14/2022  1:51:49 PM Send to Urgent Queue Emelia Loron Buccino, Leilani PLEASE NOTE: All timestamps contained within this report are represented as [place] Standard Time. CONFIDENTIALTY NOTICE: This fax transmission is intended only for the addressee. It contains information that is legally privileged, confidential or otherwise protected from use or disclosure. If you are not the intended recipient, you are strictly prohibited from reviewing, disclosing, copying using or disseminating any of this information or taking any action in reliance on or regarding this information. If you have received this fax in error, please notify us immediately by telephone so that we can arrange for its return to [place]. Phone: [phone], Toll-Free: [phone], Fax: [phone] Page: 2 of 2 Call Id: [id number] [place]. Time [name] Time) Disposition Final User 02/14/2022 2:01:55 PM See HCP within 4 Hours (or PCP triage) Yes D'Heur Lucia Gaskins, RN, Adrienne Final Disposition 02/14/2022 2:01:55 PM See HCP within 4 Hours (or PCP triage) Yes D'Heur Lucia Gaskins, RN, Ebbie Latus Disagree/Comply Comply Caller Understands Yes PreDisposition Call Doctor Care Advice Given Per Guideline SEE HCP (OR PCP TRIAGE) WITHIN 4 HOURS: * IF OFFICE WILL BE OPEN: You need to be seen within the next 3 or 4 hours. Call your doctor (or NP/PA) now or as soon as the office opens. CALL BACK IF: * You become worse CARE ADVICE given per COVID-19 - DIAGNOSED OR SUSPECTED (Adult) guideline. Referrals GO TO FACILITY OTHER - SPECIF

## 2022-02-15 ENCOUNTER — Inpatient Hospital Stay (HOSPITAL_COMMUNITY)
Admission: EM | Admit: 2022-02-15 | Discharge: 2022-02-23 | DRG: 483 | Disposition: A | Discharge status: Skilled Nursing Facility | Payer: Medicare Other | Attending: Family Medicine | Admitting: Family Medicine

## 2022-02-15 ENCOUNTER — Emergency Department (HOSPITAL_COMMUNITY): Payer: Medicare Other

## 2022-02-15 ENCOUNTER — Inpatient Hospital Stay (HOSPITAL_COMMUNITY): Payer: Medicare Other

## 2022-02-15 ENCOUNTER — Other Ambulatory Visit: Payer: Self-pay

## 2022-02-15 ENCOUNTER — Encounter (HOSPITAL_COMMUNITY): Payer: Self-pay

## 2022-02-15 ENCOUNTER — Ambulatory Visit: Payer: Medicare Other | Admitting: Family Medicine

## 2022-02-15 DIAGNOSIS — Z471 Aftercare following joint replacement surgery: Secondary | ICD-10-CM | POA: Diagnosis not present

## 2022-02-15 DIAGNOSIS — Z79899 Other long term (current) drug therapy: Secondary | ICD-10-CM | POA: Diagnosis not present

## 2022-02-15 DIAGNOSIS — S42201A Unspecified fracture of upper end of right humerus, initial encounter for closed fracture: Secondary | ICD-10-CM

## 2022-02-15 DIAGNOSIS — I10 Essential (primary) hypertension: Secondary | ICD-10-CM | POA: Diagnosis not present

## 2022-02-15 DIAGNOSIS — I452 Bifascicular block: Secondary | ICD-10-CM | POA: Diagnosis not present

## 2022-02-15 DIAGNOSIS — S42392A Other fracture of shaft of left humerus, initial encounter for closed fracture: Secondary | ICD-10-CM | POA: Diagnosis not present

## 2022-02-15 DIAGNOSIS — S42202A Unspecified fracture of upper end of left humerus, initial encounter for closed fracture: Secondary | ICD-10-CM | POA: Insufficient documentation

## 2022-02-15 DIAGNOSIS — K219 Gastro-esophageal reflux disease without esophagitis: Secondary | ICD-10-CM | POA: Diagnosis present

## 2022-02-15 DIAGNOSIS — S42211A Unspecified displaced fracture of surgical neck of right humerus, initial encounter for closed fracture: Secondary | ICD-10-CM | POA: Diagnosis not present

## 2022-02-15 DIAGNOSIS — M25412 Effusion, left shoulder: Secondary | ICD-10-CM | POA: Diagnosis not present

## 2022-02-15 DIAGNOSIS — S42292A Other displaced fracture of upper end of left humerus, initial encounter for closed fracture: Secondary | ICD-10-CM | POA: Diagnosis not present

## 2022-02-15 DIAGNOSIS — E119 Type 2 diabetes mellitus without complications: Secondary | ICD-10-CM

## 2022-02-15 DIAGNOSIS — S42222A 2-part displaced fracture of surgical neck of left humerus, initial encounter for closed fracture: Secondary | ICD-10-CM | POA: Diagnosis not present

## 2022-02-15 DIAGNOSIS — Y92009 Unspecified place in unspecified non-institutional (private) residence as the place of occurrence of the external cause: Secondary | ICD-10-CM | POA: Diagnosis not present

## 2022-02-15 DIAGNOSIS — S42352A Displaced comminuted fracture of shaft of humerus, left arm, initial encounter for closed fracture: Secondary | ICD-10-CM | POA: Diagnosis not present

## 2022-02-15 DIAGNOSIS — J069 Acute upper respiratory infection, unspecified: Secondary | ICD-10-CM | POA: Diagnosis not present

## 2022-02-15 DIAGNOSIS — E1169 Type 2 diabetes mellitus with other specified complication: Secondary | ICD-10-CM | POA: Diagnosis present

## 2022-02-15 DIAGNOSIS — M79629 Pain in unspecified upper arm: Secondary | ICD-10-CM | POA: Diagnosis not present

## 2022-02-15 DIAGNOSIS — Y92014 Private driveway to single-family (private) house as the place of occurrence of the external cause: Secondary | ICD-10-CM | POA: Diagnosis not present

## 2022-02-15 DIAGNOSIS — W010XXA Fall on same level from slipping, tripping and stumbling without subsequent striking against object, initial encounter: Secondary | ICD-10-CM | POA: Diagnosis present

## 2022-02-15 DIAGNOSIS — E785 Hyperlipidemia, unspecified: Secondary | ICD-10-CM | POA: Diagnosis not present

## 2022-02-15 DIAGNOSIS — Z9181 History of falling: Secondary | ICD-10-CM | POA: Diagnosis not present

## 2022-02-15 DIAGNOSIS — Z7984 Long term (current) use of oral hypoglycemic drugs: Secondary | ICD-10-CM | POA: Diagnosis not present

## 2022-02-15 DIAGNOSIS — M79603 Pain in arm, unspecified: Secondary | ICD-10-CM | POA: Diagnosis not present

## 2022-02-15 DIAGNOSIS — M19011 Primary osteoarthritis, right shoulder: Secondary | ICD-10-CM | POA: Diagnosis present

## 2022-02-15 DIAGNOSIS — Z6838 Body mass index (BMI) 38.0-38.9, adult: Secondary | ICD-10-CM

## 2022-02-15 DIAGNOSIS — E6609 Other obesity due to excess calories: Secondary | ICD-10-CM | POA: Diagnosis present

## 2022-02-15 DIAGNOSIS — S42291A Other displaced fracture of upper end of right humerus, initial encounter for closed fracture: Secondary | ICD-10-CM | POA: Diagnosis not present

## 2022-02-15 DIAGNOSIS — M25521 Pain in right elbow: Secondary | ICD-10-CM | POA: Diagnosis not present

## 2022-02-15 DIAGNOSIS — S42309A Unspecified fracture of shaft of humerus, unspecified arm, initial encounter for closed fracture: Secondary | ICD-10-CM | POA: Diagnosis present

## 2022-02-15 DIAGNOSIS — F4320 Adjustment disorder, unspecified: Secondary | ICD-10-CM | POA: Diagnosis present

## 2022-02-15 DIAGNOSIS — S42202D Unspecified fracture of upper end of left humerus, subsequent encounter for fracture with routine healing: Secondary | ICD-10-CM | POA: Diagnosis not present

## 2022-02-15 DIAGNOSIS — W19XXXA Unspecified fall, initial encounter: Secondary | ICD-10-CM

## 2022-02-15 DIAGNOSIS — W19XXXS Unspecified fall, sequela: Secondary | ICD-10-CM

## 2022-02-15 DIAGNOSIS — R278 Other lack of coordination: Secondary | ICD-10-CM | POA: Diagnosis not present

## 2022-02-15 DIAGNOSIS — S42221A 2-part displaced fracture of surgical neck of right humerus, initial encounter for closed fracture: Secondary | ICD-10-CM

## 2022-02-15 DIAGNOSIS — E66811 Obesity, class 1: Secondary | ICD-10-CM

## 2022-02-15 DIAGNOSIS — Z888 Allergy status to other drugs, medicaments and biological substances status: Secondary | ICD-10-CM

## 2022-02-15 DIAGNOSIS — R7303 Prediabetes: Secondary | ICD-10-CM

## 2022-02-15 DIAGNOSIS — S42252A Displaced fracture of greater tuberosity of left humerus, initial encounter for closed fracture: Secondary | ICD-10-CM | POA: Diagnosis not present

## 2022-02-15 DIAGNOSIS — S42351A Displaced comminuted fracture of shaft of humerus, right arm, initial encounter for closed fracture: Secondary | ICD-10-CM | POA: Diagnosis not present

## 2022-02-15 DIAGNOSIS — Z1152 Encounter for screening for COVID-19: Secondary | ICD-10-CM | POA: Diagnosis not present

## 2022-02-15 DIAGNOSIS — S42241A 4-part fracture of surgical neck of right humerus, initial encounter for closed fracture: Principal | ICD-10-CM | POA: Diagnosis present

## 2022-02-15 DIAGNOSIS — Z741 Need for assistance with personal care: Secondary | ICD-10-CM | POA: Diagnosis not present

## 2022-02-15 DIAGNOSIS — Z743 Need for continuous supervision: Secondary | ICD-10-CM | POA: Diagnosis not present

## 2022-02-15 DIAGNOSIS — G8918 Other acute postprocedural pain: Secondary | ICD-10-CM | POA: Diagnosis not present

## 2022-02-15 DIAGNOSIS — Z96611 Presence of right artificial shoulder joint: Secondary | ICD-10-CM | POA: Diagnosis not present

## 2022-02-15 DIAGNOSIS — R9431 Abnormal electrocardiogram [ECG] [EKG]: Secondary | ICD-10-CM | POA: Diagnosis not present

## 2022-02-15 DIAGNOSIS — S42212A Unspecified displaced fracture of surgical neck of left humerus, initial encounter for closed fracture: Secondary | ICD-10-CM | POA: Diagnosis not present

## 2022-02-15 DIAGNOSIS — J45909 Unspecified asthma, uncomplicated: Secondary | ICD-10-CM | POA: Diagnosis present

## 2022-02-15 DIAGNOSIS — R2689 Other abnormalities of gait and mobility: Secondary | ICD-10-CM | POA: Diagnosis not present

## 2022-02-15 DIAGNOSIS — S42201D Unspecified fracture of upper end of right humerus, subsequent encounter for fracture with routine healing: Secondary | ICD-10-CM | POA: Diagnosis not present

## 2022-02-15 DIAGNOSIS — Z800 Family history of malignant neoplasm of digestive organs: Secondary | ICD-10-CM

## 2022-02-15 DIAGNOSIS — M25512 Pain in left shoulder: Secondary | ICD-10-CM | POA: Diagnosis not present

## 2022-02-15 DIAGNOSIS — R059 Cough, unspecified: Secondary | ICD-10-CM | POA: Diagnosis not present

## 2022-02-15 DIAGNOSIS — Z8781 Personal history of (healed) traumatic fracture: Secondary | ICD-10-CM | POA: Insufficient documentation

## 2022-02-15 DIAGNOSIS — S42391A Other fracture of shaft of right humerus, initial encounter for closed fracture: Secondary | ICD-10-CM | POA: Diagnosis not present

## 2022-02-15 DIAGNOSIS — J9811 Atelectasis: Secondary | ICD-10-CM | POA: Diagnosis not present

## 2022-02-15 DIAGNOSIS — M25511 Pain in right shoulder: Secondary | ICD-10-CM | POA: Diagnosis not present

## 2022-02-15 LAB — CBG MONITORING, ED: Glucose-Capillary: 176 mg/dL — ABNORMAL HIGH (ref 70–99)

## 2022-02-15 LAB — COMPREHENSIVE METABOLIC PANEL
ALT: 18 U/L (ref 0–44)
AST: 32 U/L (ref 15–41)
Albumin: 3.5 g/dL (ref 3.5–5.0)
Alkaline Phosphatase: 63 U/L (ref 38–126)
Anion gap: 15 (ref 5–15)
BUN: 17 mg/dL (ref 8–23)
CO2: 17 mmol/L — ABNORMAL LOW (ref 22–32)
Calcium: 8.8 mg/dL — ABNORMAL LOW (ref 8.9–10.3)
Chloride: 106 mmol/L (ref 98–111)
Creatinine, Ser: 0.78 mg/dL (ref 0.44–1.00)
GFR, Estimated: >60 mL/min (ref >60)
Glucose, Bld: 167 mg/dL — ABNORMAL HIGH (ref 70–99)
Potassium: 4.1 mmol/L (ref 3.5–5.1)
Sodium: 138 mmol/L (ref 135–145)
Total Bilirubin: 0.8 mg/dL (ref 0.3–1.2)
Total Protein: 6.6 g/dL (ref 6.5–8.1)

## 2022-02-15 LAB — CBC WITH DIFFERENTIAL/PLATELET
Abs Immature Granulocytes: 0.07 10*3/uL (ref 0.00–0.07)
Basophils Absolute: 0 10*3/uL (ref 0.0–0.1)
Basophils Relative: 0 %
Eosinophils Absolute: 0 10*3/uL (ref 0.0–0.5)
Eosinophils Relative: 0 %
HCT: 38.1 % (ref 36.0–46.0)
Hemoglobin: 12.5 g/dL (ref 12.0–15.0)
Immature Granulocytes: 1 %
Lymphocytes Relative: 5 %
Lymphs Abs: 0.7 10*3/uL (ref 0.7–4.0)
MCH: 32.4 pg (ref 26.0–34.0)
MCHC: 32.8 g/dL (ref 30.0–36.0)
MCV: 98.7 fL (ref 80.0–100.0)
Monocytes Absolute: 0.4 10*3/uL (ref 0.1–1.0)
Monocytes Relative: 3 %
Neutro Abs: 12.5 10*3/uL — ABNORMAL HIGH (ref 1.7–7.7)
Neutrophils Relative %: 91 %
Platelets: 159 10*3/uL (ref 150–400)
RBC: 3.86 MIL/uL — ABNORMAL LOW (ref 3.87–5.11)
RDW: 12 % (ref 11.5–15.5)
WBC: 13.6 10*3/uL — ABNORMAL HIGH (ref 4.0–10.5)
nRBC: 0 % (ref 0.0–0.2)

## 2022-02-15 LAB — PROTIME-INR
INR: 1.2 (ref 0.8–1.2)
Prothrombin Time: 14.8 seconds (ref 11.4–15.2)

## 2022-02-15 LAB — APTT: aPTT: 29 seconds (ref 24–36)

## 2022-02-15 MED ORDER — HYDROCODONE-ACETAMINOPHEN 5-325 MG PO TABS
1.0000 | ORAL_TABLET | ORAL | Status: DC | PRN
  Administered 2022-02-15 – 2022-02-17 (×6): 2 via ORAL
  Filled 2022-02-15 (×6): qty 2

## 2022-02-15 MED ORDER — ACETAMINOPHEN 325 MG PO TABS
650.0000 mg | ORAL_TABLET | Freq: Four times a day (QID) | ORAL | Status: DC | PRN
  Administered 2022-02-18 (×2): 650 mg via ORAL
  Filled 2022-02-15 (×2): qty 2

## 2022-02-15 MED ORDER — ACETAMINOPHEN 650 MG RE SUPP: 650.0000 mg | Freq: Four times a day (QID) | RECTAL | Status: DC | PRN

## 2022-02-15 MED ORDER — HYDROMORPHONE HCL 1 MG/ML IJ SOLN
0.5000 mg | INTRAMUSCULAR | Status: DC | PRN
  Administered 2022-02-15: 1 mg via INTRAVENOUS
  Administered 2022-02-15: 0.5 mg via INTRAVENOUS
  Administered 2022-02-16 – 2022-02-17 (×5): 1 mg via INTRAVENOUS
  Filled 2022-02-15 (×7): qty 1

## 2022-02-15 MED ORDER — HYDROMORPHONE HCL 1 MG/ML IJ SOLN
0.5000 mg | Freq: Once | INTRAMUSCULAR | Status: AC
  Administered 2022-02-15: 0.5 mg via INTRAMUSCULAR
  Filled 2022-02-15: qty 1

## 2022-02-15 MED ORDER — OXYCODONE-ACETAMINOPHEN 5-325 MG PO TABS
1.0000 | ORAL_TABLET | Freq: Once | ORAL | Status: AC
  Administered 2022-02-15: 1 via ORAL
  Filled 2022-02-15: qty 1

## 2022-02-15 MED ORDER — SIMVASTATIN 20 MG PO TABS
20.0000 mg | ORAL_TABLET | Freq: Every day | ORAL | Status: DC
  Administered 2022-02-15 – 2022-02-22 (×8): 20 mg via ORAL
  Filled 2022-02-15 (×8): qty 1

## 2022-02-15 MED ORDER — FENTANYL CITRATE PF 50 MCG/ML IJ SOSY: 25.0000 ug | PREFILLED_SYRINGE | Freq: Once | INTRAMUSCULAR | Status: DC

## 2022-02-15 MED ORDER — SODIUM CHLORIDE 0.9% FLUSH
3.0000 mL | Freq: Two times a day (BID) | INTRAVENOUS | Status: DC
  Administered 2022-02-15 – 2022-02-22 (×14): 3 mL via INTRAVENOUS

## 2022-02-15 MED ORDER — INSULIN ASPART 100 UNIT/ML IJ SOLN
0.0000 [IU] | Freq: Three times a day (TID) | INTRAMUSCULAR | Status: DC
  Administered 2022-02-15: 2 [IU] via SUBCUTANEOUS
  Administered 2022-02-16 (×2): 1 [IU] via SUBCUTANEOUS
  Administered 2022-02-16 – 2022-02-17 (×4): 2 [IU] via SUBCUTANEOUS
  Administered 2022-02-18 – 2022-02-19 (×4): 1 [IU] via SUBCUTANEOUS
  Administered 2022-02-19: 2 [IU] via SUBCUTANEOUS
  Administered 2022-02-20 – 2022-02-22 (×6): 1 [IU] via SUBCUTANEOUS
  Administered 2022-02-23: 2 [IU] via SUBCUTANEOUS

## 2022-02-15 MED ORDER — POLYETHYLENE GLYCOL 3350 17 G PO PACK: 17.0000 g | PACK | Freq: Every day | ORAL | Status: DC | PRN

## 2022-02-15 NOTE — ED Triage Notes (Signed)
Patient was walking to take her trash out and tripped and fell and stuck both arms out to catch herself. Patient has pain in both arms but deformity noted to right elbow. Patient denies hitting her head and no LOC. No thinners. Pulses present in both upper extremities. Patient able to move fingers on both extremities as well.

## 2022-02-15 NOTE — Telephone Encounter (Signed)
Noted  
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
## 2022-02-15 NOTE — ED Notes (Signed)
Attempted IV x2. Unsuccessful. Pressure dressing applied to sites. No bleeding noted.

## 2022-02-15 NOTE — ED Notes (Signed)
Unable to obtain IV acces by multiple RN's.

## 2022-02-15 NOTE — Telephone Encounter (Signed)
Patient went to the ED today.

## 2022-02-16 ENCOUNTER — Inpatient Hospital Stay (HOSPITAL_COMMUNITY): Payer: Medicare Other

## 2022-02-16 DIAGNOSIS — S42221A 2-part displaced fracture of surgical neck of right humerus, initial encounter for closed fracture: Secondary | ICD-10-CM | POA: Diagnosis not present

## 2022-02-16 DIAGNOSIS — S42222A 2-part displaced fracture of surgical neck of left humerus, initial encounter for closed fracture: Secondary | ICD-10-CM | POA: Diagnosis not present

## 2022-02-16 DIAGNOSIS — W19XXXA Unspecified fall, initial encounter: Secondary | ICD-10-CM | POA: Diagnosis not present

## 2022-02-16 LAB — RESPIRATORY PANEL BY PCR

## 2022-02-16 LAB — COMPREHENSIVE METABOLIC PANEL
ALT: 17 U/L (ref 0–44)
AST: 26 U/L (ref 15–41)
Albumin: 3.2 g/dL — ABNORMAL LOW (ref 3.5–5.0)
Alkaline Phosphatase: 55 U/L (ref 38–126)
Anion gap: 10 (ref 5–15)
BUN: 25 mg/dL — ABNORMAL HIGH (ref 8–23)
CO2: 22 mmol/L (ref 22–32)
Calcium: 8.8 mg/dL — ABNORMAL LOW (ref 8.9–10.3)
Chloride: 105 mmol/L (ref 98–111)
Creatinine, Ser: 0.92 mg/dL (ref 0.44–1.00)
GFR, Estimated: >60 mL/min (ref >60)
Glucose, Bld: 143 mg/dL — ABNORMAL HIGH (ref 70–99)
Potassium: 4.3 mmol/L (ref 3.5–5.1)
Sodium: 137 mmol/L (ref 135–145)
Total Bilirubin: 1 mg/dL (ref 0.3–1.2)
Total Protein: 6.1 g/dL — ABNORMAL LOW (ref 6.5–8.1)

## 2022-02-16 LAB — CBC
HCT: 32.9 % — ABNORMAL LOW (ref 36.0–46.0)
Hemoglobin: 11.6 g/dL — ABNORMAL LOW (ref 12.0–15.0)
MCH: 32.6 pg (ref 26.0–34.0)
MCHC: 35.3 g/dL (ref 30.0–36.0)
MCV: 92.4 fL (ref 80.0–100.0)
Platelets: 183 10*3/uL (ref 150–400)
RBC: 3.56 MIL/uL — ABNORMAL LOW (ref 3.87–5.11)
RDW: 12.1 % (ref 11.5–15.5)
WBC: 12.6 10*3/uL — ABNORMAL HIGH (ref 4.0–10.5)
nRBC: 0 % (ref 0.0–0.2)

## 2022-02-16 LAB — RESP PANEL BY RT-PCR (RSV, FLU A&B, COVID)  RVPGX2
Influenza A by PCR: NEGATIVE
Influenza B by PCR: NEGATIVE
Resp Syncytial Virus by PCR: NEGATIVE
SARS Coronavirus 2 by RT PCR: NEGATIVE

## 2022-02-16 LAB — GLUCOSE, CAPILLARY
Glucose-Capillary: 121 mg/dL — ABNORMAL HIGH (ref 70–99)
Glucose-Capillary: 135 mg/dL — ABNORMAL HIGH (ref 70–99)
Glucose-Capillary: 149 mg/dL — ABNORMAL HIGH (ref 70–99)
Glucose-Capillary: 176 mg/dL — ABNORMAL HIGH (ref 70–99)
Glucose-Capillary: 182 mg/dL — ABNORMAL HIGH (ref 70–99)

## 2022-02-16 MED ORDER — ONDANSETRON HCL 4 MG/2ML IJ SOLN
4.0000 mg | Freq: Four times a day (QID) | INTRAMUSCULAR | Status: DC | PRN
  Filled 2022-02-16: qty 2

## 2022-02-16 NOTE — Plan of Care (Signed)

## 2022-02-16 NOTE — Progress Notes (Signed)
PROGRESS NOTE    ALLYNE HEBERT  UMP:[id number] DOB: 11-Jun-1935 DOA: 02/15/2022 PCP: Abner Greenspan, MD    Brief Narrative:  Cindy Whitehead is a 86 y.o. female from home with medical history significant of hyperlipidemia, diabetes, GERD, asthma, hypertension, obesity, low back pain, adjustment disorder with anxiety and depression presenting after fall.   Patient states that she was walking to take out her trash and tripped and fell.  She put both her arms out to break her fall and after landing on her arm she has pain in both arms right greater than left.  She additionally reports some tingling in her right hand.  She states that she did not hit her head nor lose consciousness.   Of note, her roommate has COVID 19.   Assessment and Plan: Right humerus fracture/Left humerus fracture s/p Fall > Patient presenting after mechanical fall while taking trash out.  She put both her arms out to catch herself and after catching her cell she has had pain in both arms right greater than left. Imaging in ED showed bilateral humerus fractures.   -Patient evaluated by orthopedics or getting> CT scans of both shoulders plan for reverse total shoulder on the right on Friday and possible nonoperative management on the left pending CT results. - Continue as needed pain medication with Tylenol for mild pain, Norco for moderate to severe pain, Dilaudid for severe/breakthrough pain   URI -NP swab and COVID rule out in progress -dg chest  Hyperlipidemia - Continue home simvastatin   Diabetes - SSI   Hypertension GERD Asthma  - Listed in chart but not currently on any medications    Obesity Estimated body mass index is 34.18 kg/m as calculated from the following:   Height as of this encounter: 5' (1.524 m).   Weight as of this encounter: 79.4 kg.   DVT prophylaxis: SCDs Start: 02/15/22 1440    Code Status: Full Code Family Communication: called daughter  Disposition Plan:  Level of care:  Med-Surg Status is: Inpatient Remains inpatient appropriate because: needs surgery Friday    Consultants:  ortho   Subjective: +sick contact C/o upper respiratory symptoms/congestion   Objective: Vitals:   02/15/22 2158 02/15/22 2259 02/16/22 0338 02/16/22 0749  BP: (!) 151/93 (!) 140/94 (!) 154/81 (!) 147/69  Pulse: 91 88 94 71  Resp: '17 18 19   '$ Temp:  98.5 F (36.9 C) 98.1 F (36.7 C) 97.9 F (36.6 C)  TempSrc:  Oral Oral Oral  SpO2: 96% 97% 98% 98%  Weight:      Height:       No intake or output data in the 24 hours ending 02/16/22 0858 Filed Weights   02/15/22 0938  Weight: 79.4 kg    Examination:   General: Appearance:    Obese female in no acute distress     Lungs:     respirations unlabored  Heart:    Normal heart rate.    MS:   All extremities are intact.    Neurologic:   Awake, alert       Data Reviewed: I have personally reviewed following labs and imaging studies  CBC: Recent Labs  Lab 02/15/22 1637 02/16/22 0354  WBC 13.6* 12.6*  NEUTROABS 12.5*  --   HGB 12.5 11.6*  HCT 38.1 32.9*  MCV 98.7 92.4  PLT 159 400   Basic Metabolic Panel: Recent Labs  Lab 02/15/22 1637 02/16/22 0354  NA 138 137  K 4.1 4.3  CL  106 105  CO2 17* 22  GLUCOSE 167* 143*  BUN 17 25*  CREATININE 0.78 0.92  CALCIUM 8.8* 8.8*   GFR: Estimated Creatinine Clearance: 42.5 mL/min (by C-G formula based on SCr of 0.92 mg/dL). Liver Function Tests: Recent Labs  Lab 02/15/22 1637 02/16/22 0354  AST 32 26  ALT 18 17  ALKPHOS 63 55  BILITOT 0.8 1.0  PROT 6.6 6.1*  ALBUMIN 3.5 3.2*   No results for input(s): "LIPASE", "AMYLASE" in the last 168 hours. No results for input(s): "AMMONIA" in the last 168 hours. Coagulation Profile: Recent Labs  Lab 02/15/22 1637  INR 1.2   Cardiac Enzymes: No results for input(s): "CKTOTAL", "CKMB", "CKMBINDEX", "TROPONINI" in the last 168 hours. BNP (last 3 results) No results for input(s): "PROBNP" in the last  8760 hours. HbA1C: No results for input(s): "HGBA1C" in the last 72 hours. CBG: Recent Labs  Lab 02/15/22 1745 02/16/22 0007 02/16/22 0834  GLUCAP 176* 149* 135*   Lipid Profile: No results for input(s): "CHOL", "HDL", "LDLCALC", "TRIG", "CHOLHDL", "LDLDIRECT" in the last 72 hours. Thyroid Function Tests: No results for input(s): "TSH", "T4TOTAL", "FREET4", "T3FREE", "THYROIDAB" in the last 72 hours. Anemia Panel: No results for input(s): "VITAMINB12", "FOLATE", "FERRITIN", "TIBC", "IRON", "RETICCTPCT" in the last 72 hours. Sepsis Labs: No results for input(s): "PROCALCITON", "LATICACIDVEN" in the last 168 hours.  No results found for this or any previous visit (from the past 240 hour(s)).       Radiology Studies: CT SHOULDER LEFT WO CONTRAST  Result Date: 02/15/2022 CLINICAL DATA:  Shoulder trauma, fracture of humerus or scapula EXAM: CT OF THE UPPER LEFT EXTREMITY WITHOUT CONTRAST TECHNIQUE: Multidetector CT imaging of the upper left extremity was performed according to the standard protocol. RADIATION DOSE REDUCTION: This exam was performed according to the departmental dose-optimization program which includes automated exposure control, adjustment of the mA and/or kV according to patient size and/or use of iterative reconstruction technique. COMPARISON:  Radiograph 02/15/2022 FINDINGS: Bones/Joint/Cartilage There is a comminuted fracture of the proximal humerus through the surgical neck with impaction. Extension through the greater tuberosity with significant displacement. No humeral head splitting component. No humeral shaft fracture. No glenoid fracture or significant glenoid bone loss. Small joint effusion. Mild glenohumeral and AC joint osteoarthritis. Ligaments Suboptimally assessed by CT. Muscles and Tendons No significant muscle atrophy. Soft tissues Soft tissue swelling along the left shoulder. IMPRESSION: Comminuted fracture of the proximal humerus through the surgical neck  with impaction. Extension through the greater tuberosity with significant displacement. No head splitting component. No glenoid fracture or significant glenoid bone loss. Small joint effusion. Electronically Signed   By: Maurine Simmering M.D.   On: 02/15/2022 16:48   CT SHOULDER RIGHT WO CONTRAST  Result Date: 02/15/2022 CLINICAL DATA:  Shoulder trauma, fracture of humerus or scapula EXAM: CT OF THE UPPER RIGHT EXTREMITY WITHOUT CONTRAST TECHNIQUE: Multidetector CT imaging of the upper right extremity was performed according to the standard protocol. RADIATION DOSE REDUCTION: This exam was performed according to the departmental dose-optimization program which includes automated exposure control, adjustment of the mA and/or kV according to patient size and/or use of iterative reconstruction technique. COMPARISON:  Right humerus radiograph 02/15/2022 FINDINGS: Bones/Joint/Cartilage There is a comminuted fracture of the proximal humerus through the surgical neck with severe displacement. There is extension through the greater tuberosity with up to 7 mm displacement. There is fracture extension through the far posterosuperior humeral head articular surface but no large head splitting component. Impaction with small fracture  fragment along the lesser tuberosity. Posterior subluxation of the humeral head with respect to the glenoid. No glenoid fracture or significant glenoid bone loss. Small lipohemarthrosis. Mild AC joint osteoarthritis. Severe multilevel degenerative changes in the cervical spine, partially visualized. Ligaments Suboptimally assessed by CT. Muscles and Tendons The no significant muscle atrophy. Soft tissues Soft tissue swelling along the right shoulder. Mild interlobular septal thickening in the right lung apex. IMPRESSION: Comminuted fracture of the proximal humerus through the surgical neck with severe displacement. Extension through the greater tuberosity with up to 7 mm displacement. Fracture  extension through the far posterosuperior humeral head articular surface but no large humeral head splitting component. Posterior subluxation of the humeral head with respect to the glenoid. No glenoid fracture or significant glenoid bone loss. Small lipohemarthrosis. Electronically Signed   By: Maurine Simmering M.D.   On: 02/15/2022 16:38   DG Elbow 2 Views Right  Result Date: 02/15/2022 CLINICAL DATA:  86 year old female status post fall on driveway. Pain. EXAM: RIGHT ELBOW - 2 VIEW COMPARISON:  Right humerus series today. FINDINGS: Two somewhat oblique views on this exam, similar to the elbow visualization on the humerus series today. Grossly maintained alignment and joint spaces. No obvious fracture at the elbow. Limited evaluation for joint effusion due to obliquity. Chronic degenerative changes at the lateral supracondylar humerus. IMPRESSION: Suboptimal due to oblique positioning. No acute fracture or dislocation identified about the right elbow. Electronically Signed   By: Genevie Ann M.D.   On: 02/15/2022 11:59   DG Shoulder Left  Result Date: 02/15/2022 CLINICAL DATA:  86 year old female status post fall on driveway. Pain. EXAM: LEFT SHOULDER - 2+ VIEW COMPARISON:  Left humerus series today. FINDINGS: On these images fracture of the left humerus neck appears mildly impacted and anteriorly displaced. 4 cm posterolateral comminution fragment from the head redemonstrated. No significant glenohumeral dislocation underlying. Visible left scapula and clavicle appear to remain intact. Visible left ribs appear intact. IMPRESSION: 1. Comminuted proximal left humerus fracture as demonstrated on the humerus series today. On these images there does appear to be mild impaction and anterior displacement at the humeral neck. 2. No obvious superimposed glenohumeral dislocation. There is a 4 cm comminution fragment of the humeral head. 3. Left scapula and visible clavicle appear to remain intact. Electronically Signed    By: Genevie Ann M.D.   On: 02/15/2022 11:57   DG Humerus Left  Result Date: 02/15/2022 CLINICAL DATA:  86 year old female status post fall on driveway. Pain. EXAM: LEFT HUMERUS - 2+ VIEW COMPARISON:  Chest radiographs 03/20/2020. FINDINGS: There is a fracture of the proximal left humerus with a roughly 4 cm comminution fragment displaced posteriorly and laterally from the humeral head. The humeral neck may also be fractured, but there is minimal displacement there. Alignment appears maintained at the left elbow. Grossly intact visible left scapula and clavicle. IMPRESSION: Comminuted proximal left humerus fracture including a 4 cm fragment displaced posteriorly and laterally from the head. Probable minimally displaced fracture at the neck. Electronically Signed   By: Genevie Ann M.D.   On: 02/15/2022 11:55   DG Humerus Right  Result Date: 02/15/2022 CLINICAL DATA:  86 year old female status post fall on driveway. Pain. EXAM: RIGHT HUMERUS - 2+ VIEW COMPARISON:  Right shoulder series today. FINDINGS: As reported separately both the humeral head and neck appear fractured. Up to 4 cm comminution fragments at the humeral head are new from chest radiographs last year. Medial/axillary displacement and over-riding. The more distal right humerus  appears intact. Maintained alignment at the right elbow. As on the shoulder series today both the scapula and clavicle appear grossly intact, stable visible right chest. IMPRESSION: 1. Comminuted, displaced and overriding proximal right humerus fracture as described on the shoulder today. 2. Distal right humerus appears intact. No other acute osseous abnormality identified. Electronically Signed   By: Genevie Ann M.D.   On: 02/15/2022 11:54   DG Shoulder Right  Result Date: 02/15/2022 CLINICAL DATA:  86 year old female status post fall on driveway. Pain. EXAM: RIGHT SHOULDER - 2+ VIEW COMPARISON:  Chest radiographs 03/20/2020. FINDINGS: Transverse or mildly oblique fracture of  the proximal right humerus appears to be at the level of the neck with 1 full shaft width medial/axillary displacement and over-riding of at least 6 cm. Humeral head may remain aligned with the glenoid, but also appears comminuted on these images. No obvious right clavicle or scapula fracture. Visible right ribs and chest appear stable. IMPRESSION: Comminuted proximal right humerus fracture, probably involving both the head and the neck. Distal fragment one full shaft width medial/axillary displacement and over-riding of at least 6 cm. Electronically Signed   By: Genevie Ann M.D.   On: 02/15/2022 11:50        Scheduled Meds:  insulin aspart  0-9 Units Subcutaneous TID WC   simvastatin  20 mg Oral q1800   sodium chloride flush  3 mL Intravenous Q12H   Continuous Infusions:   LOS: 1 day    Time spent: 45 minutes spent on chart review, discussion with nursing staff, consultants, updating family and interview/physical exam; more than 50% of that time was spent in counseling and/or coordination of care.    Geradine Girt, DO [facility] Available via Epic secure chat 7am-7pm After these hours, please refer to coverage provider listed on [email or web address] 02/16/2022, 8:58 AM

## 2022-02-17 LAB — GLUCOSE, CAPILLARY
Glucose-Capillary: 153 mg/dL — ABNORMAL HIGH (ref 70–99)
Glucose-Capillary: 154 mg/dL — ABNORMAL HIGH (ref 70–99)
Glucose-Capillary: 154 mg/dL — ABNORMAL HIGH (ref 70–99)
Glucose-Capillary: 160 mg/dL — ABNORMAL HIGH (ref 70–99)
Glucose-Capillary: 120 mg/dL — ABNORMAL HIGH (ref 70–99)

## 2022-02-17 MED ORDER — METOCLOPRAMIDE HCL 5 MG/ML IJ SOLN: 10.0000 mg | Freq: Four times a day (QID) | INTRAMUSCULAR | Status: DC | PRN

## 2022-02-17 MED ORDER — ONDANSETRON HCL 4 MG/2ML IJ SOLN
INTRAMUSCULAR | Status: AC
  Administered 2022-02-17: 4 mg via INTRAVENOUS
  Filled 2022-02-17: qty 2

## 2022-02-17 MED ORDER — ONDANSETRON HCL 4 MG/2ML IJ SOLN: 4.0000 mg | Freq: Four times a day (QID) | INTRAMUSCULAR | Status: DC | PRN

## 2022-02-17 MED ORDER — PROCHLORPERAZINE EDISYLATE 10 MG/2ML IJ SOLN
10.0000 mg | Freq: Four times a day (QID) | INTRAMUSCULAR | Status: DC
  Administered 2022-02-17 – 2022-02-22 (×18): 10 mg via INTRAVENOUS
  Filled 2022-02-17 (×20): qty 2

## 2022-02-17 MED ORDER — ONDANSETRON HCL 4 MG/2ML IJ SOLN: 4.0000 mg | Freq: Four times a day (QID) | INTRAMUSCULAR | Status: DC

## 2022-02-17 MED ORDER — POLYETHYLENE GLYCOL 3350 17 G PO PACK
17.0000 g | PACK | Freq: Two times a day (BID) | ORAL | Status: DC
  Administered 2022-02-17 – 2022-02-22 (×9): 17 g via ORAL
  Filled 2022-02-17 (×12): qty 1

## 2022-02-17 MED ORDER — GUAIFENESIN ER 600 MG PO TB12
600.0000 mg | ORAL_TABLET | Freq: Two times a day (BID) | ORAL | Status: DC
  Administered 2022-02-17 – 2022-02-23 (×13): 600 mg via ORAL
  Filled 2022-02-17 (×13): qty 1

## 2022-02-17 MED ORDER — ONDANSETRON HCL 4 MG/2ML IJ SOLN
4.0000 mg | Freq: Four times a day (QID) | INTRAMUSCULAR | Status: DC
  Filled 2022-02-17: qty 2

## 2022-02-17 NOTE — Plan of Care (Signed)

## 2022-02-17 NOTE — Progress Notes (Signed)
PROGRESS NOTE    Cindy Whitehead  ATF:[id number] DOB: December 06, 1935 DOA: 02/15/2022 PCP: Abner Greenspan, MD    Brief Narrative:  Cindy Whitehead is a 86 y.o. female from home with medical history significant of hyperlipidemia, diabetes, GERD, asthma, hypertension, obesity, low back pain, adjustment disorder with anxiety and depression presenting after fall.   Patient states that she was walking to take out her trash and tripped and fell.  She put both her arms out to break her fall and after landing on her arm she has pain in both arms right greater than left.  She additionally reports some tingling in her right hand.  She states that she did not hit her head nor lose consciousness.   Of note, her roommate has COVID 63 (has not been around roommate in 4 days)   Assessment and Plan: Right humerus fracture/Left humerus fracture s/p Fall > Patient presenting after mechanical fall while taking trash out.  She put both her arms out to catch herself and after catching herself she has had pain in both arms right greater than left. Imaging in ED showed bilateral humerus fractures.   -Patient evaluated by orthopedics or getting> CT scans of both shoulders plan for reverse total shoulder on the right on Friday and possible nonoperative management on the left pending CT results. - Continue as needed pain medication with Tylenol for mild pain, Norco for moderate to severe pain, Dilaudid for severe/breakthrough pain   URI -xray with out pna -NP swabs negative -pulm toilet  Hyperlipidemia - Continue home simvastatin   Diabetes - SSI   Hypertension GERD Asthma  - Listed in chart but not currently on any medications    Obesity Estimated body mass index is 34.18 kg/m as calculated from the following:   Height as of this encounter: 5' (1.524 m).   Weight as of this encounter: 79.4 kg.   DVT prophylaxis: SCDs Start: 02/15/22 1440    Code Status: Full Code Family Communication: daughter at  bedside  Disposition Plan:  Level of care: Med-Surg Status is: Inpatient Remains inpatient appropriate because: needs surgery Friday    Consultants:  ortho   Subjective: Some vomiting this AM   Objective: Vitals:   02/16/22 1550 02/16/22 1955 02/17/22 0236 02/17/22 0750  BP: (!) 154/61 (!) 128/54 129/60 (!) 163/62  Pulse: 74 84 79 96  Resp:  18    Temp: 98.4 F (36.9 C) 98.4 F (36.9 C) 98.1 F (36.7 C) 98.1 F (36.7 C)  TempSrc: Oral  Oral Oral  SpO2: 97% 99% 98% 92%  Weight:      Height:        Intake/Output Summary (Last 24 hours) at 02/17/2022 1153 Last data filed at 02/17/2022 0600 Gross per 24 hour  Intake --  Output 600 ml  Net -600 ml   Filed Weights   02/15/22 0938  Weight: 79.4 kg    Examination:    General: Appearance:    Obese female in no acute distress     Lungs:     respirations unlabored  Heart:    Normal heart rate.   MS:   All extremities are intact.   Neurologic:   Awake, alert         Data Reviewed: I have personally reviewed following labs and imaging studies  CBC: Recent Labs  Lab 02/15/22 1637 02/16/22 0354  WBC 13.6* 12.6*  NEUTROABS 12.5*  --   HGB 12.5 11.6*  HCT 38.1 32.9*  MCV 98.7  92.4  PLT 159 564   Basic Metabolic Panel: Recent Labs  Lab 02/15/22 1637 02/16/22 0354  NA 138 137  K 4.1 4.3  CL 106 105  CO2 17* 22  GLUCOSE 167* 143*  BUN 17 25*  CREATININE 0.78 0.92  CALCIUM 8.8* 8.8*   GFR: Estimated Creatinine Clearance: 42.5 mL/min (by C-G formula based on SCr of 0.92 mg/dL). Liver Function Tests: Recent Labs  Lab 02/15/22 1637 02/16/22 0354  AST 32 26  ALT 18 17  ALKPHOS 63 55  BILITOT 0.8 1.0  PROT 6.6 6.1*  ALBUMIN 3.5 3.2*   No results for input(s): "LIPASE", "AMYLASE" in the last 168 hours. No results for input(s): "AMMONIA" in the last 168 hours. Coagulation Profile: Recent Labs  Lab 02/15/22 1637  INR 1.2   Cardiac Enzymes: No results for input(s): "CKTOTAL", "CKMB",  "CKMBINDEX", "TROPONINI" in the last 168 hours. BNP (last 3 results) No results for input(s): "PROBNP" in the last 8760 hours. HbA1C: No results for input(s): "HGBA1C" in the last 72 hours. CBG: Recent Labs  Lab 02/16/22 0834 02/16/22 1135 02/16/22 1644 02/16/22 2008 02/17/22 0752  GLUCAP 135* 182* 121* 176* 153*   Lipid Profile: No results for input(s): "CHOL", "HDL", "LDLCALC", "TRIG", "CHOLHDL", "LDLDIRECT" in the last 72 hours. Thyroid Function Tests: No results for input(s): "TSH", "T4TOTAL", "FREET4", "T3FREE", "THYROIDAB" in the last 72 hours. Anemia Panel: No results for input(s): "VITAMINB12", "FOLATE", "FERRITIN", "TIBC", "IRON", "RETICCTPCT" in the last 72 hours. Sepsis Labs: No results for input(s): "PROCALCITON", "LATICACIDVEN" in the last 168 hours.  Recent Results (from the past 240 hour(s))  Respiratory (~20 pathogens) panel by PCR     Status: None   Collection Time: 02/16/22  8:36 AM   Specimen: Nasopharyngeal Swab; Respiratory  Result Value Ref Range Status   Adenovirus NOT DETECTED NOT DETECTED Final   Coronavirus 229E NOT DETECTED NOT DETECTED Final    Comment: (NOTE) The Coronavirus on the Respiratory Panel, DOES NOT test for the novel  Coronavirus (2019 nCoV)    Coronavirus HKU1 NOT DETECTED NOT DETECTED Final   Coronavirus NL63 NOT DETECTED NOT DETECTED Final   Coronavirus OC43 NOT DETECTED NOT DETECTED Final   Metapneumovirus NOT DETECTED NOT DETECTED Final   Rhinovirus / Enterovirus NOT DETECTED NOT DETECTED Final   Influenza A NOT DETECTED NOT DETECTED Final   Influenza B NOT DETECTED NOT DETECTED Final   Parainfluenza Virus 1 NOT DETECTED NOT DETECTED Final   Parainfluenza Virus 2 NOT DETECTED NOT DETECTED Final   Parainfluenza Virus 3 NOT DETECTED NOT DETECTED Final   Parainfluenza Virus 4 NOT DETECTED NOT DETECTED Final   Respiratory Syncytial Virus NOT DETECTED NOT DETECTED Final   Bordetella pertussis NOT DETECTED NOT DETECTED Final    Bordetella Parapertussis NOT DETECTED NOT DETECTED Final   Chlamydophila pneumoniae NOT DETECTED NOT DETECTED Final   Mycoplasma pneumoniae NOT DETECTED NOT DETECTED Final    Comment: Performed at [facility], [place]. [address], [place], [place] [address]  Resp panel by RT-PCR (RSV, Flu A&B, Covid) Anterior Nasal Swab     Status: None   Collection Time: 02/16/22  8:36 AM   Specimen: Anterior Nasal Swab  Result Value Ref Range Status   SARS Coronavirus 2 by RT PCR NEGATIVE NEGATIVE Final    Comment: (NOTE) SARS-CoV-2 target nucleic acids are NOT DETECTED.  The SARS-CoV-2 RNA is generally detectable in upper respiratory specimens during the acute phase of infection. The lowest concentration of SARS-CoV-2 viral copies this  assay can detect is 138 copies/mL. A negative result does not preclude SARS-Cov-2 infection and should not be used as the sole basis for treatment or other patient management decisions. A negative result may occur with  improper specimen collection/handling, submission of specimen other than nasopharyngeal swab, presence of viral mutation(s) within the areas targeted by this assay, and inadequate number of viral copies(<138 copies/mL). A negative result must be combined with clinical observations, patient history, and epidemiological information. The expected result is Negative.  Fact Sheet for Patients:  EntrepreneurPulse.com.au  Fact Sheet for Healthcare Providers:  IncredibleEmployment.be  This test is no t yet approved or cleared by the [place] FDA and  has been authorized for detection and/or diagnosis of SARS-CoV-2 by FDA under an Emergency Use Authorization (EUA). This EUA will remain  in effect (meaning this test can be used) for the duration of the COVID-19 declaration under Section 564(b)(1) of the Act, 21 U.S.C.section 360bbb-3(b)(1), unless the authorization is terminated  or revoked sooner.        Influenza A by PCR NEGATIVE NEGATIVE Final   Influenza B by PCR NEGATIVE NEGATIVE Final    Comment: (NOTE) The Xpert Xpress SARS-CoV-2/FLU/RSV plus assay is intended as an aid in the diagnosis of influenza from Nasopharyngeal swab specimens and should not be used as a sole basis for treatment. Nasal washings and aspirates are unacceptable for Xpert Xpress SARS-CoV-2/FLU/RSV testing.  Fact Sheet for Patients: EntrepreneurPulse.com.au  Fact Sheet for Healthcare Providers: IncredibleEmployment.be  This test is not yet approved or cleared by the [place] FDA and has been authorized for detection and/or diagnosis of SARS-CoV-2 by FDA under an Emergency Use Authorization (EUA). This EUA will remain in effect (meaning this test can be used) for the duration of the COVID-19 declaration under Section 564(b)(1) of the Act, 21 U.S.C. section 360bbb-3(b)(1), unless the authorization is terminated or revoked.     Resp Syncytial Virus by PCR NEGATIVE NEGATIVE Final    Comment: (NOTE) Fact Sheet for Patients: EntrepreneurPulse.com.au  Fact Sheet for Healthcare Providers: IncredibleEmployment.be  This test is not yet approved or cleared by the [place] FDA and has been authorized for detection and/or diagnosis of SARS-CoV-2 by FDA under an Emergency Use Authorization (EUA). This EUA will remain in effect (meaning this test can be used) for the duration of the COVID-19 declaration under Section 564(b)(1) of the Act, 21 U.S.C. section 360bbb-3(b)(1), unless the authorization is terminated or revoked.  Performed at [facility], [place] [address], [place], [place] [address]          Radiology Studies: DG CHEST PORT 1 VIEW  Result Date: 02/16/2022 CLINICAL DATA:  Pneumonia, cough. EXAM: PORTABLE CHEST 1 VIEW COMPARISON:  March 20, 2020. FINDINGS: Stable cardiomediastinal silhouette. Minimal bibasilar  subsegmental atelectasis is noted. Severely displaced proximal right humeral fracture is noted. IMPRESSION: Minimal bibasilar subsegmental atelectasis. Severely displaced proximal right humeral fracture. Electronically Signed   By: Marijo Conception M.D.   On: 02/16/2022 10:21   CT SHOULDER LEFT WO CONTRAST  Result Date: 02/15/2022 CLINICAL DATA:  Shoulder trauma, fracture of humerus or scapula EXAM: CT OF THE UPPER LEFT EXTREMITY WITHOUT CONTRAST TECHNIQUE: Multidetector CT imaging of the upper left extremity was performed according to the standard protocol. RADIATION DOSE REDUCTION: This exam was performed according to the departmental dose-optimization program which includes automated exposure control, adjustment of the mA and/or kV according to patient size and/or use of iterative reconstruction technique. COMPARISON:  Radiograph 02/15/2022 FINDINGS: Bones/Joint/Cartilage There  is a comminuted fracture of the proximal humerus through the surgical neck with impaction. Extension through the greater tuberosity with significant displacement. No humeral head splitting component. No humeral shaft fracture. No glenoid fracture or significant glenoid bone loss. Small joint effusion. Mild glenohumeral and AC joint osteoarthritis. Ligaments Suboptimally assessed by CT. Muscles and Tendons No significant muscle atrophy. Soft tissues Soft tissue swelling along the left shoulder. IMPRESSION: Comminuted fracture of the proximal humerus through the surgical neck with impaction. Extension through the greater tuberosity with significant displacement. No head splitting component. No glenoid fracture or significant glenoid bone loss. Small joint effusion. Electronically Signed   By: Maurine Simmering M.D.   On: 02/15/2022 16:48   CT SHOULDER RIGHT WO CONTRAST  Result Date: 02/15/2022 CLINICAL DATA:  Shoulder trauma, fracture of humerus or scapula EXAM: CT OF THE UPPER RIGHT EXTREMITY WITHOUT CONTRAST TECHNIQUE: Multidetector CT  imaging of the upper right extremity was performed according to the standard protocol. RADIATION DOSE REDUCTION: This exam was performed according to the departmental dose-optimization program which includes automated exposure control, adjustment of the mA and/or kV according to patient size and/or use of iterative reconstruction technique. COMPARISON:  Right humerus radiograph 02/15/2022 FINDINGS: Bones/Joint/Cartilage There is a comminuted fracture of the proximal humerus through the surgical neck with severe displacement. There is extension through the greater tuberosity with up to 7 mm displacement. There is fracture extension through the far posterosuperior humeral head articular surface but no large head splitting component. Impaction with small fracture fragment along the lesser tuberosity. Posterior subluxation of the humeral head with respect to the glenoid. No glenoid fracture or significant glenoid bone loss. Small lipohemarthrosis. Mild AC joint osteoarthritis. Severe multilevel degenerative changes in the cervical spine, partially visualized. Ligaments Suboptimally assessed by CT. Muscles and Tendons The no significant muscle atrophy. Soft tissues Soft tissue swelling along the right shoulder. Mild interlobular septal thickening in the right lung apex. IMPRESSION: Comminuted fracture of the proximal humerus through the surgical neck with severe displacement. Extension through the greater tuberosity with up to 7 mm displacement. Fracture extension through the far posterosuperior humeral head articular surface but no large humeral head splitting component. Posterior subluxation of the humeral head with respect to the glenoid. No glenoid fracture or significant glenoid bone loss. Small lipohemarthrosis. Electronically Signed   By: Maurine Simmering M.D.   On: 02/15/2022 16:38        Scheduled Meds:  guaiFENesin  600 mg Oral BID   insulin aspart  0-9 Units Subcutaneous TID WC   polyethylene glycol  17 g  Oral BID   prochlorperazine  10 mg Intravenous Q6H   simvastatin  20 mg Oral q1800   sodium chloride flush  3 mL Intravenous Q12H   Continuous Infusions:   LOS: 2 days    Time spent: 45 minutes spent on chart review, discussion with nursing staff, consultants, updating family and interview/physical exam; more than 50% of that time was spent in counseling and/or coordination of care.    Geradine Girt, DO [facility] Available via Epic secure chat 7am-7pm After these hours, please refer to coverage provider listed on [email or web address] 02/17/2022, 11:53 AM

## 2022-02-18 ENCOUNTER — Encounter (HOSPITAL_COMMUNITY)
Admission: EM | Disposition: A | Discharge status: Skilled Nursing Facility | Payer: Self-pay | Source: Home / Self Care | Attending: Internal Medicine

## 2022-02-18 ENCOUNTER — Encounter (HOSPITAL_COMMUNITY): Payer: Self-pay | Admitting: Internal Medicine

## 2022-02-18 ENCOUNTER — Other Ambulatory Visit: Payer: Self-pay

## 2022-02-18 ENCOUNTER — Inpatient Hospital Stay (HOSPITAL_COMMUNITY): Payer: Medicare Other | Admitting: Anesthesiology

## 2022-02-18 ENCOUNTER — Inpatient Hospital Stay (HOSPITAL_COMMUNITY): Payer: Medicare Other

## 2022-02-18 DIAGNOSIS — W19XXXA Unspecified fall, initial encounter: Secondary | ICD-10-CM | POA: Diagnosis not present

## 2022-02-18 DIAGNOSIS — S42222A 2-part displaced fracture of surgical neck of left humerus, initial encounter for closed fracture: Secondary | ICD-10-CM | POA: Diagnosis not present

## 2022-02-18 DIAGNOSIS — S42201A Unspecified fracture of upper end of right humerus, initial encounter for closed fracture: Secondary | ICD-10-CM

## 2022-02-18 DIAGNOSIS — M19011 Primary osteoarthritis, right shoulder: Secondary | ICD-10-CM

## 2022-02-18 DIAGNOSIS — E119 Type 2 diabetes mellitus without complications: Secondary | ICD-10-CM

## 2022-02-18 DIAGNOSIS — J45909 Unspecified asthma, uncomplicated: Secondary | ICD-10-CM

## 2022-02-18 HISTORY — PX: REVERSE SHOULDER ARTHROPLASTY: SHX5054

## 2022-02-18 LAB — SURGICAL PCR SCREEN
MRSA, PCR: NEGATIVE
Staphylococcus aureus: POSITIVE — AB

## 2022-02-18 LAB — CBC
HCT: 30.8 % — ABNORMAL LOW (ref 36.0–46.0)
Hemoglobin: 10.2 g/dL — ABNORMAL LOW (ref 12.0–15.0)
MCH: 32.2 pg (ref 26.0–34.0)
MCHC: 33.1 g/dL (ref 30.0–36.0)
MCV: 97.2 fL (ref 80.0–100.0)
Platelets: 165 10*3/uL (ref 150–400)
RBC: 3.17 MIL/uL — ABNORMAL LOW (ref 3.87–5.11)
RDW: 12.3 % (ref 11.5–15.5)
WBC: 10.1 10*3/uL (ref 4.0–10.5)
nRBC: 0 % (ref 0.0–0.2)

## 2022-02-18 LAB — GLUCOSE, CAPILLARY
Glucose-Capillary: 129 mg/dL — ABNORMAL HIGH (ref 70–99)
Glucose-Capillary: 137 mg/dL — ABNORMAL HIGH (ref 70–99)
Glucose-Capillary: 150 mg/dL — ABNORMAL HIGH (ref 70–99)
Glucose-Capillary: 157 mg/dL — ABNORMAL HIGH (ref 70–99)

## 2022-02-18 LAB — BASIC METABOLIC PANEL
Anion gap: 8 (ref 5–15)
BUN: 21 mg/dL (ref 8–23)
CO2: 24 mmol/L (ref 22–32)
Calcium: 8.5 mg/dL — ABNORMAL LOW (ref 8.9–10.3)
Chloride: 103 mmol/L (ref 98–111)
Creatinine, Ser: 0.84 mg/dL (ref 0.44–1.00)
GFR, Estimated: >60 mL/min (ref >60)
Glucose, Bld: 157 mg/dL — ABNORMAL HIGH (ref 70–99)
Potassium: 4.3 mmol/L (ref 3.5–5.1)
Sodium: 135 mmol/L (ref 135–145)

## 2022-02-18 SURGERY — ARTHROPLASTY, SHOULDER, TOTAL, REVERSE
Anesthesia: Regional | Site: Shoulder | Laterality: Right

## 2022-02-18 MED ORDER — CHLORHEXIDINE GLUCONATE CLOTH 2 % EX PADS: 6.0000 | MEDICATED_PAD | Freq: Every day | CUTANEOUS | Status: DC

## 2022-02-18 MED ORDER — PHENYLEPHRINE HCL-NACL 20-0.9 MG/250ML-% IV SOLN
INTRAVENOUS | Status: DC | PRN
  Administered 2022-02-18: 25 ug/min via INTRAVENOUS

## 2022-02-18 MED ORDER — ONDANSETRON HCL 4 MG PO TABS: 4.0000 mg | ORAL_TABLET | Freq: Three times a day (TID) | ORAL | 0 refills | Status: DC | PRN

## 2022-02-18 MED ORDER — PHENOL 1.4 % MT LIQD: 1.0000 | OROMUCOSAL | Status: DC | PRN

## 2022-02-18 MED ORDER — ORAL CARE MOUTH RINSE: 15.0000 mL | Freq: Once | OROMUCOSAL | Status: AC

## 2022-02-18 MED ORDER — CEFAZOLIN SODIUM-DEXTROSE 2-3 GM-%(50ML) IV SOLR
INTRAVENOUS | Status: DC | PRN
  Administered 2022-02-18: 2 g via INTRAVENOUS

## 2022-02-18 MED ORDER — LACTATED RINGERS IV SOLN: INTRAVENOUS | Status: DC

## 2022-02-18 MED ORDER — PHENYLEPHRINE 80 MCG/ML (10ML) SYRINGE FOR IV PUSH (FOR BLOOD PRESSURE SUPPORT)
PREFILLED_SYRINGE | INTRAVENOUS | Status: AC
  Filled 2022-02-18: qty 10

## 2022-02-18 MED ORDER — HYDROCODONE-ACETAMINOPHEN 5-325 MG PO TABS
1.0000 | ORAL_TABLET | ORAL | Status: DC | PRN
  Administered 2022-02-18: 1 via ORAL
  Filled 2022-02-18: qty 1

## 2022-02-18 MED ORDER — DOCUSATE SODIUM 100 MG PO CAPS
100.0000 mg | ORAL_CAPSULE | Freq: Two times a day (BID) | ORAL | Status: DC
  Administered 2022-02-18 – 2022-02-22 (×8): 100 mg via ORAL
  Filled 2022-02-18 (×10): qty 1

## 2022-02-18 MED ORDER — ROCURONIUM BROMIDE 10 MG/ML (PF) SYRINGE
PREFILLED_SYRINGE | INTRAVENOUS | Status: AC
  Filled 2022-02-18: qty 10

## 2022-02-18 MED ORDER — EPHEDRINE 5 MG/ML INJ
INTRAVENOUS | Status: AC
  Filled 2022-02-18: qty 5

## 2022-02-18 MED ORDER — METOCLOPRAMIDE HCL 5 MG/ML IJ SOLN: 5.0000 mg | Freq: Three times a day (TID) | INTRAMUSCULAR | Status: DC | PRN

## 2022-02-18 MED ORDER — CEFAZOLIN SODIUM 1 G IJ SOLR
INTRAMUSCULAR | Status: AC
  Filled 2022-02-18: qty 20

## 2022-02-18 MED ORDER — FENTANYL CITRATE (PF) 250 MCG/5ML IJ SOLN
INTRAMUSCULAR | Status: AC
  Filled 2022-02-18: qty 5

## 2022-02-18 MED ORDER — FENTANYL CITRATE (PF) 100 MCG/2ML IJ SOLN: 50.0000 ug | Freq: Once | INTRAMUSCULAR | Status: AC

## 2022-02-18 MED ORDER — ONDANSETRON HCL 4 MG PO TABS: 4.0000 mg | ORAL_TABLET | Freq: Four times a day (QID) | ORAL | Status: DC | PRN

## 2022-02-18 MED ORDER — FENTANYL CITRATE (PF) 250 MCG/5ML IJ SOLN
INTRAMUSCULAR | Status: DC | PRN
  Administered 2022-02-18: 100 ug via INTRAVENOUS

## 2022-02-18 MED ORDER — SUGAMMADEX SODIUM 200 MG/2ML IV SOLN
INTRAVENOUS | Status: DC | PRN
  Administered 2022-02-18: 317.6 mg via INTRAVENOUS

## 2022-02-18 MED ORDER — 0.9 % SODIUM CHLORIDE (POUR BTL) OPTIME
TOPICAL | Status: DC | PRN
  Administered 2022-02-18 (×2): 1000 mL

## 2022-02-18 MED ORDER — PROPOFOL 10 MG/ML IV BOLUS
INTRAVENOUS | Status: DC | PRN
  Administered 2022-02-18: 50 mg via INTRAVENOUS

## 2022-02-18 MED ORDER — PHENYLEPHRINE HCL (PRESSORS) 10 MG/ML IV SOLN
INTRAVENOUS | Status: DC | PRN
  Administered 2022-02-18: 160 ug via INTRAVENOUS

## 2022-02-18 MED ORDER — ONDANSETRON HCL 4 MG/2ML IJ SOLN
INTRAMUSCULAR | Status: DC | PRN
  Administered 2022-02-18: 4 mg via INTRAVENOUS

## 2022-02-18 MED ORDER — OXYCODONE-ACETAMINOPHEN 5-325 MG PO TABS: 1.0000 | ORAL_TABLET | Freq: Four times a day (QID) | ORAL | 0 refills | Status: DC | PRN

## 2022-02-18 MED ORDER — FENTANYL CITRATE (PF) 100 MCG/2ML IJ SOLN
INTRAMUSCULAR | Status: AC
  Administered 2022-02-18: 50 ug via INTRAVENOUS
  Filled 2022-02-18: qty 2

## 2022-02-18 MED ORDER — ONDANSETRON HCL 4 MG/2ML IJ SOLN: 4.0000 mg | Freq: Four times a day (QID) | INTRAMUSCULAR | Status: DC | PRN

## 2022-02-18 MED ORDER — MUPIROCIN 2 % EX OINT
1.0000 | TOPICAL_OINTMENT | Freq: Two times a day (BID) | CUTANEOUS | Status: DC
  Administered 2022-02-18 – 2022-02-23 (×8): 1 via NASAL
  Filled 2022-02-18 (×2): qty 22

## 2022-02-18 MED ORDER — DIPHENHYDRAMINE HCL 50 MG/ML IJ SOLN
INTRAMUSCULAR | Status: DC | PRN
  Administered 2022-02-18: 12.5 mg via INTRAVENOUS

## 2022-02-18 MED ORDER — METOCLOPRAMIDE HCL 5 MG PO TABS: 5.0000 mg | ORAL_TABLET | Freq: Three times a day (TID) | ORAL | Status: DC | PRN

## 2022-02-18 MED ORDER — DEXAMETHASONE SODIUM PHOSPHATE 10 MG/ML IJ SOLN
INTRAMUSCULAR | Status: AC
  Filled 2022-02-18: qty 1

## 2022-02-18 MED ORDER — ROCURONIUM BROMIDE 10 MG/ML (PF) SYRINGE
PREFILLED_SYRINGE | INTRAVENOUS | Status: DC | PRN
  Administered 2022-02-18 (×2): 50 mg via INTRAVENOUS

## 2022-02-18 MED ORDER — VANCOMYCIN HCL 1 G IV SOLR
INTRAVENOUS | Status: DC | PRN
  Administered 2022-02-18: 1000 mg via TOPICAL

## 2022-02-18 MED ORDER — FENTANYL CITRATE (PF) 100 MCG/2ML IJ SOLN: 25.0000 ug | INTRAMUSCULAR | Status: DC | PRN

## 2022-02-18 MED ORDER — CHLORHEXIDINE GLUCONATE 0.12 % MT SOLN
15.0000 mL | Freq: Once | OROMUCOSAL | Status: AC
  Administered 2022-02-18: 15 mL via OROMUCOSAL
  Filled 2022-02-18: qty 15

## 2022-02-18 MED ORDER — MENTHOL 3 MG MT LOZG: 1.0000 | LOZENGE | OROMUCOSAL | Status: DC | PRN

## 2022-02-18 MED ORDER — TRANEXAMIC ACID-NACL 1000-0.7 MG/100ML-% IV SOLN
1000.0000 mg | Freq: Once | INTRAVENOUS | Status: AC
  Administered 2022-02-18: 1000 mg via INTRAVENOUS
  Filled 2022-02-18: qty 100

## 2022-02-18 MED ORDER — DEXAMETHASONE SODIUM PHOSPHATE 10 MG/ML IJ SOLN
INTRAMUSCULAR | Status: DC | PRN
  Administered 2022-02-18: 10 mg via INTRAVENOUS

## 2022-02-18 MED ORDER — CEFAZOLIN SODIUM-DEXTROSE 1-4 GM/50ML-% IV SOLN
1.0000 g | Freq: Four times a day (QID) | INTRAVENOUS | Status: AC
  Administered 2022-02-18 – 2022-02-19 (×3): 1 g via INTRAVENOUS
  Filled 2022-02-18 (×3): qty 50

## 2022-02-18 MED ORDER — ONDANSETRON HCL 4 MG/2ML IJ SOLN
INTRAMUSCULAR | Status: AC
  Filled 2022-02-18: qty 2

## 2022-02-18 MED ORDER — HYDROMORPHONE HCL 1 MG/ML IJ SOLN
0.5000 mg | INTRAMUSCULAR | Status: DC | PRN
  Administered 2022-02-19: 0.5 mg via INTRAVENOUS
  Filled 2022-02-18: qty 0.5

## 2022-02-18 MED ORDER — CHLORHEXIDINE GLUCONATE CLOTH 2 % EX PADS
6.0000 | MEDICATED_PAD | Freq: Every day | CUTANEOUS | Status: DC
  Administered 2022-02-19 – 2022-02-22 (×4): 6 via TOPICAL

## 2022-02-18 MED ORDER — LIDOCAINE 2% (20 MG/ML) 5 ML SYRINGE
INTRAMUSCULAR | Status: AC
  Filled 2022-02-18: qty 5

## 2022-02-18 SURGICAL SUPPLY — 72 items
AID PSTN UNV HD RSTRNT DISP (MISCELLANEOUS) ×1
BAG COUNTER SPONGE SURGICOUNT (BAG) ×2 IMPLANT
BAG SPNG CNTER NS LX DISP (BAG) ×1
BIT DRILL FLUTED 3.0 STRL (BIT) IMPLANT
BSPLAT GLND +2X24 MDLR (Joint) ×1 IMPLANT
COVER BACK TABLE 60X90IN (DRAPES) IMPLANT
COVER SURGICAL LIGHT HANDLE (MISCELLANEOUS) ×2 IMPLANT
CUP SUT UNIV REVERS 36 NEUTRAL (Cup) IMPLANT
DRAPE IMP U-DRAPE 54X76 (DRAPES) ×4 IMPLANT
DRAPE INCISE IOBAN 66X45 STRL (DRAPES) ×2 IMPLANT
DRAPE ORTHO SPLIT 77X108 STRL (DRAPES) ×1
DRAPE SURG 17X23 STRL (DRAPES) ×2 IMPLANT
DRAPE SURG ORHT 6 SPLT 77X108 (DRAPES) ×4 IMPLANT
DRAPE U-SHAPE 47X51 STRL (DRAPES) ×2 IMPLANT
DRESSING AQUACEL AG SP 3.5X6 (GAUZE/BANDAGES/DRESSINGS) ×2 IMPLANT
DRSG AQUACEL AG ADV 3.5X10 (GAUZE/BANDAGES/DRESSINGS) ×2 IMPLANT
DRSG AQUACEL AG SP 3.5X6 (GAUZE/BANDAGES/DRESSINGS)
DURAPREP 26ML APPLICATOR (WOUND CARE) ×2 IMPLANT
ELECT BLADE 4.0 EZ CLEAN MEGAD (MISCELLANEOUS) ×1
ELECT REM PT RETURN 9FT ADLT (ELECTROSURGICAL) ×1
ELECTRODE BLDE 4.0 EZ CLN MEGD (MISCELLANEOUS) ×2 IMPLANT
ELECTRODE REM PT RTRN 9FT ADLT (ELECTROSURGICAL) ×2 IMPLANT
GLENOID UNI REV MOD 24 +2 LAT (Joint) IMPLANT
GLENOSPHERE 36 +4 LAT/24 (Joint) IMPLANT
GLOVE BIO SURGEON STRL SZ7.5 (GLOVE) ×4 IMPLANT
GLOVE BIOGEL PI IND STRL 8 (GLOVE) ×4 IMPLANT
GOWN STRL REUS W/ TWL LRG LVL3 (GOWN DISPOSABLE) ×2 IMPLANT
GOWN STRL REUS W/ TWL XL LVL3 (GOWN DISPOSABLE) ×4 IMPLANT
GOWN STRL REUS W/TWL LRG LVL3 (GOWN DISPOSABLE) ×1
GOWN STRL REUS W/TWL XL LVL3 (GOWN DISPOSABLE) ×2
INSERT HUMERAL UNI REVERS 36 6 (Insert) IMPLANT
KIT BASIN OR (CUSTOM PROCEDURE TRAY) ×2 IMPLANT
KIT TURNOVER KIT B (KITS) ×2 IMPLANT
NDL 1/2 CIR MAYO (NEEDLE) ×2 IMPLANT
NDL TAPERED W/ NITINOL LOOP (MISCELLANEOUS) IMPLANT
NEEDLE 1/2 CIR MAYO (NEEDLE) IMPLANT
NEEDLE TAPERED W/ NITINOL LOOP (MISCELLANEOUS) ×1 IMPLANT
NS IRRIG 1000ML POUR BTL (IV SOLUTION) ×2 IMPLANT
PACK SHOULDER (CUSTOM PROCEDURE TRAY) ×2 IMPLANT
PAD ARMBOARD 7.5X6 YLW CONV (MISCELLANEOUS) ×4 IMPLANT
PIN SET MODULAR GLENOID SYSTEM (PIN) IMPLANT
RESTRAINT HEAD UNIVERSAL NS (MISCELLANEOUS) ×2 IMPLANT
SCREW CENTRAL MODULAR 25 (Screw) IMPLANT
SCREW PERI LOCK 5.5X16 (Screw) IMPLANT
SCREW PERI LOCK 5.5X24 (Screw) IMPLANT
SCREW PERI LOCK 5.5X32 (Screw) IMPLANT
SLING ARM FOAM STRAP LRG (SOFTGOODS) IMPLANT
SLING ARM IMMOBILIZER LRG (SOFTGOODS) IMPLANT
SLING ARM IMMOBILIZER MED (SOFTGOODS) IMPLANT
SPONGE T-LAP 18X18 ~~LOC~~+RFID (SPONGE) IMPLANT
SPONGE T-LAP 4X18 ~~LOC~~+RFID (SPONGE) ×2 IMPLANT
STEM HUMERAL UNI REVERS SZ9 (Stem) IMPLANT
STRIP CLOSURE SKIN 1/2X4 (GAUZE/BANDAGES/DRESSINGS) ×2 IMPLANT
SUCTION FRAZIER HANDLE 10FR (MISCELLANEOUS)
SUCTION TUBE FRAZIER 10FR DISP (MISCELLANEOUS) ×2 IMPLANT
SUT FIBERWIRE #2 38 T-5 BLUE (SUTURE)
SUT FIBERWIRE #5 38 CONV NDL (SUTURE) ×1
SUT MNCRL AB 3-0 PS2 18 (SUTURE) ×2 IMPLANT
SUT VIC AB 0 CT1 27 (SUTURE) ×1
SUT VIC AB 0 CT1 27XBRD ANBCTR (SUTURE) IMPLANT
SUT VIC AB 1 CT1 27 (SUTURE)
SUT VIC AB 1 CT1 27XBRD ANBCTR (SUTURE) IMPLANT
SUT VIC AB 2-0 CT1 27 (SUTURE) ×1
SUT VIC AB 2-0 CT1 TAPERPNT 27 (SUTURE) ×2 IMPLANT
SUTURE FIBERWR #2 38 T-5 BLUE (SUTURE) ×2 IMPLANT
SUTURE FIBERWR #5 38 CONV NDL (SUTURE) IMPLANT
SUTURE TAPE 1.3 40 TPR END (SUTURE) IMPLANT
SUTURETAPE 1.3 40 TPR END (SUTURE) ×6
TOWEL GREEN STERILE (TOWEL DISPOSABLE) ×2 IMPLANT
TOWER CARTRIDGE SMART MIX (DISPOSABLE) IMPLANT
WATER STERILE IRR 1000ML POUR (IV SOLUTION) ×2 IMPLANT
YANKAUER SUCT BULB TIP NO VENT (SUCTIONS) ×2 IMPLANT

## 2022-02-18 NOTE — Anesthesia Procedure Notes (Signed)
Anesthesia Regional Block: Interscalene brachial plexus block   Pre-Anesthetic Checklist: , timeout performed,  Correct Patient, Correct Site, Correct Laterality,  Correct Procedure, Correct Position, site marked,  Risks and benefits discussed,  Pre-op evaluation,  At surgeon's request and post-op pain management  Laterality: Right  Prep: Maximum Sterile Barrier Precautions used, chloraprep       Needles:  Injection technique: Single-shot  Needle Type: Echogenic Stimulator Needle     Needle Length: 5cm  Needle Gauge: 22     Additional Needles:   Procedures:, nerve stimulator,,, ultrasound used (permanent image in chart),,     Nerve Stimulator or Paresthesia:  Response: Biceps response  Additional Responses:   Narrative:  Start time: 02/18/2022 2:20 PM End time: 02/18/2022 2:30 PM Injection made incrementally with aspirations every 5 mL.  Performed by: Personally  Anesthesiologist: Roderic Palau, MD  Additional Notes:

## 2022-02-18 NOTE — Care Management Important Message (Signed)
Important Message  Patient Details  Name: SALA TAGUE MRN: [id number] Date of Birth: 1935/06/08   Medicare Important Message Given:  Yes     Hannah Beat 02/18/2022, 2:12 PM

## 2022-02-18 NOTE — Discharge Instructions (Addendum)
Orthopedic surgery discharge instructions:  -Maintain postoperative bandage until follow-up appointment.  This is waterproof, and you may begin showering on postoperative day #3.  Do not submerge underwater.  Maintain that bandage until your follow-up appointment in 2 weeks.  -No lifting over 2 pounds with operateive arm.  You may use the arm immediately for activities of daily living such as bathing, washing your face and brushing your teeth, eating, and getting dressed.  Otherwise maintain your sling when you are out of the house and sleeping.  -Apply ice liberally to the shoulder throughout the day.  For mild to moderate pain use Tylenol and Advil as needed around-the-clock.  For breakthrough pain use oxycodone as necessary.  -You will return to see Dr. Stann Mainland in the office in 2 weeks for routine postoperative check with x-rays.   -Take an 81 mg aspirin twice per day x 6 weeks for DVT prophylaxis.

## 2022-02-18 NOTE — Anesthesia Preprocedure Evaluation (Addendum)
Anesthesia Evaluation  Patient identified by MRN, date of birth, ID band Patient awake    Reviewed: Allergy & Precautions, H&P , NPO status , Patient's Chart, lab work & pertinent test results  Airway Mallampati: III  TM Distance: >3 FB Neck ROM: Full    Dental no notable dental hx. (+) Teeth Intact, Dental Advisory Given   Pulmonary asthma    Pulmonary exam normal breath sounds clear to auscultation       Cardiovascular hypertension,  Rhythm:Regular Rate:Normal     Neuro/Psych negative neurological ROS  negative psych ROS   GI/Hepatic Neg liver ROS,GERD  Medicated,,  Endo/Other  diabetes, Type 2, Oral Hypoglycemic Agents    Renal/GU negative Renal ROS  negative genitourinary   Musculoskeletal negative musculoskeletal ROS (+)    Abdominal   Peds  Hematology negative hematology ROS (+)   Anesthesia Other Findings   Reproductive/Obstetrics negative OB ROS                             Anesthesia Physical Anesthesia Plan  ASA: 3  Anesthesia Plan: General and Regional   Post-op Pain Management: Regional block* and Tylenol PO (pre-op)*   Induction: Intravenous  PONV Risk Score and Plan: 3 and Ondansetron, Dexamethasone and Diphenhydramine  Airway Management Planned: Oral ETT  Additional Equipment: CVP and Ultrasound Guidance Line Placement  Intra-op Plan:   Post-operative Plan: Extubation in OR  Informed Consent: I have reviewed the patients History and Physical, chart, labs and discussed the procedure including the risks, benefits and alternatives for the proposed anesthesia with the patient or authorized representative who has indicated his/her understanding and acceptance.     Dental advisory given  Plan Discussed with: Anesthesiologist and CRNA  Anesthesia Plan Comments:         Anesthesia Quick Evaluation

## 2022-02-18 NOTE — Plan of Care (Signed)
  Problem: Education: Goal: Knowledge of General Education information will improve Description: Including pain rating scale, medication(s)/side effects and non-pharmacologic comfort measures Outcome: Progressing   Problem: Health Behavior/Discharge Planning: Goal: Ability to manage health-related needs will improve Outcome: Progressing   Problem: Clinical Measurements: Goal: Diagnostic test results will improve Outcome: Progressing   Problem: Pain Managment: Goal: General experience of comfort will improve Outcome: Progressing   Problem: Activity: Goal: Ability to tolerate increased activity will improve Outcome: Progressing   Problem: Pain Management: Goal: Pain level will decrease with appropriate interventions Outcome: Progressing

## 2022-02-18 NOTE — H&P (Signed)
H&P update  The surgical history has been reviewed and remains accurate without interval change.  The patient was re-examined and patient's physiologic condition has not changed significantly in the last 30 days. The condition still exists that makes this procedure necessary. The treatment plan remains the same, without new options for care.  No new pharmacological allergies or types of therapy has been initiated that would change the plan or the appropriateness of the plan.  The patient and/or family understand the potential benefits and risks.  Jelicia Nantz P. Stann Mainland, MD 02/18/2022 2:21 PM

## 2022-02-18 NOTE — Anesthesia Procedure Notes (Signed)
Procedure Name: Intubation Date/Time: 02/18/2022 3:00 PM  Performed by: Minerva Ends, CRNAPre-anesthesia Checklist: Patient identified, Emergency Drugs available, Suction available and Patient being monitored Patient Re-evaluated:Patient Re-evaluated prior to induction Oxygen Delivery Method: Circle system utilized Preoxygenation: Pre-oxygenation with 100% oxygen Induction Type: IV induction Ventilation: Mask ventilation without difficulty Laryngoscope Size: Mac and 3 Grade View: Grade II Tube type: Oral Tube size: 7.0 mm Number of attempts: 1 Airway Equipment and Method: Stylet and Oral airway Placement Confirmation: ETT inserted through vocal cords under direct vision, positive ETCO2 and breath sounds checked- equal and bilateral Secured at: 22 cm Tube secured with: Tape Dental Injury: Teeth and Oropharynx as per pre-operative assessment

## 2022-02-18 NOTE — Anesthesia Procedure Notes (Signed)
Central Venous Catheter Insertion Performed by: Roderic Palau, MD, anesthesiologist Start/End12/15/2023 2:00 PM, 02/18/2022 2:15 PM Patient location: Pre-op. Preanesthetic checklist: patient identified, IV checked, site marked, risks and benefits discussed, surgical consent, monitors and equipment checked, pre-op evaluation, timeout performed and anesthesia consent Position: Trendelenburg Lidocaine 1% used for infiltration and patient sedated Hand hygiene performed , maximum sterile barriers used  and Seldinger technique used Catheter size: 8 Fr Total catheter length 16. Central line was placed.Double lumen Procedure performed using ultrasound guided technique. Ultrasound Notes:anatomy identified, needle tip was noted to be adjacent to the nerve/plexus identified, no ultrasound evidence of intravascular and/or intraneural injection and image(s) printed for medical record Attempts: 1 Following insertion, dressing applied, line sutured and Biopatch. Post procedure assessment: blood return through all ports  Patient tolerated the procedure well with no immediate complications.

## 2022-02-18 NOTE — Brief Op Note (Signed)
02/15/2022 - 02/18/2022  4:29 PM  PATIENT:  Cindy Whitehead  86 y.o. female  PRE-OPERATIVE DIAGNOSIS:  bilateral fractured shoulders  POST-OPERATIVE DIAGNOSIS:  bilateral fractured shoulders  PROCEDURE:  Procedure(s): REVERSE SHOULDER ARTHROPLASTY (Right)  SURGEON:  Surgeon(s) and Role:    * Nicholes Stairs, MD - Primary  PHYSICIAN ASSISTANT: Jonelle Sidle, PA-C   ANESTHESIA:   regional and general  EBL: 100 cc  BLOOD ADMINISTERED:none  DRAINS: none   LOCAL MEDICATIONS USED:  NONE  SPECIMEN:  No Specimen  DISPOSITION OF SPECIMEN:  N/A  COUNTS:  YES  TOURNIQUET:  * No tourniquets in log *  DICTATION: .Note written in EPIC  PLAN OF CARE: Admit to inpatient   PATIENT DISPOSITION:  PACU - hemodynamically stable.   Delay start of Pharmacological VTE agent (>24hrs) due to surgical blood loss or risk of bleeding: not applicable

## 2022-02-18 NOTE — Transfer of Care (Signed)
Immediate Anesthesia Transfer of Care Note  Patient: MAYLI COVINGTON  Procedure(s) Performed: REVERSE SHOULDER ARTHROPLASTY (Right: Shoulder)  Patient Location: PACU  Anesthesia Type:GA combined with regional for post-op pain  Level of Consciousness: sedated  Airway & Oxygen Therapy: Patient Spontanous Breathing  Post-op Assessment: Report given to RN and Post -op Vital signs reviewed and stable  Post vital signs: Reviewed and stable  Last Vitals:  Vitals Value Taken Time  BP 144/89 02/18/22 1701  Temp 36.7 C 02/18/22 1700  Pulse 103 02/18/22 1704  Resp 22 02/18/22 1704  SpO2 94 % 02/18/22 1704  Vitals shown include unvalidated device data.  Last Pain:  Vitals:   02/18/22 1334  TempSrc: Oral  PainSc:       Patients Stated Pain Goal: 3 (37/02/30 1720)  Complications: No notable events documented.

## 2022-02-18 NOTE — Progress Notes (Signed)
PROGRESS NOTE    HEMA LANZA  YWV:[id number] DOB: 1935-04-01 DOA: 02/15/2022 PCP: Abner Greenspan, MD    Brief Narrative:  Cindy Whitehead is a 86 y.o. female from home with medical history significant of hyperlipidemia, diabetes, GERD, asthma, hypertension, obesity, low back pain, adjustment disorder with anxiety and depression presenting after fall.   Patient states that she was walking to take out her trash and tripped and fell.  She put both her arms out to break her fall and after landing on her arm she has pain in both arms right greater than left.  She additionally reports some tingling in her right hand.  She states that she did not hit her head nor lose consciousness.   Of note, her roommate has COVID 43 (has not been around roommate in 4 days)   Assessment and Plan: Right humerus fracture/Left humerus fracture s/p Fall > Patient presenting after mechanical fall while taking trash out.  She put both her arms out to catch herself and after catching herself she has had pain in both arms right greater than left. Imaging in ED showed bilateral humerus fractures.   -Patient evaluated by orthopedics or getting> CT scans of both shoulders plan for reverse total shoulder on the right on Friday and possible nonoperative management on the left pending CT results.-- await ortho consult - Continue as needed pain medication with Tylenol for mild pain, Norco for moderate to severe pain, Dilaudid for severe/breakthrough pain   URI -xray with out pna -NP swabs negative -pulm toilet  Hyperlipidemia - Continue home simvastatin   Diabetes - SSI   Hypertension GERD Asthma  - Listed in chart but not currently on any medications  -PRN nebs   Obesity Estimated body mass index is 34.18 kg/m as calculated from the following:   Height as of this encounter: 5' (1.524 m).   Weight as of this encounter: 79.4 kg.    DVT prophylaxis: SCDs Start: 02/15/22 1440    Code Status: Full Code Family  Communication: daughter at bedside  Disposition Plan:  Level of care: Med-Surg Status is: Inpatient Remains inpatient appropriate because: needs surgery Friday??    Consultants:  ortho   Subjective: No further vomiting today   Objective: Vitals:   02/17/22 1941 02/17/22 2340 02/18/22 0446 02/18/22 0910  BP: (!) 102/51 (!) 135/59 137/66 (!) 147/58  Pulse: 85 100 (!) 101 71  Resp: '19 18 18 16  '$ Temp: 100.3 F (37.9 C) 99 F (37.2 C) 98.1 F (36.7 C) 98.3 F (36.8 C)  TempSrc: Oral Oral Oral Oral  SpO2: 95% 100% 98% 98%  Weight:      Height:        Intake/Output Summary (Last 24 hours) at 02/18/2022 1225 Last data filed at 02/18/2022 8546 Gross per 24 hour  Intake 480 ml  Output 300 ml  Net 180 ml   Filed Weights   02/15/22 0938  Weight: 79.4 kg    Examination:    General: Appearance:    Obese female in no acute distress     Lungs:     respirations unlabored  Heart:    Normal heart rate.   MS:   All extremities are intact.   Neurologic:   Awake, alert       Data Reviewed: I have personally reviewed following labs and imaging studies  CBC: Recent Labs  Lab 02/15/22 1637 02/16/22 0354 02/18/22 0339  WBC 13.6* 12.6* 10.1  NEUTROABS 12.5*  --   --  HGB 12.5 11.6* 10.2*  HCT 38.1 32.9* 30.8*  MCV 98.7 92.4 97.2  PLT 159 183 235   Basic Metabolic Panel: Recent Labs  Lab 02/15/22 1637 02/16/22 0354 02/18/22 0339  NA 138 137 135  K 4.1 4.3 4.3  CL 106 105 103  CO2 17* 22 24  GLUCOSE 167* 143* 157*  BUN 17 25* 21  CREATININE 0.78 0.92 0.84  CALCIUM 8.8* 8.8* 8.5*   GFR: Estimated Creatinine Clearance: 46.5 mL/min (by C-G formula based on SCr of 0.84 mg/dL). Liver Function Tests: Recent Labs  Lab 02/15/22 1637 02/16/22 0354  AST 32 26  ALT 18 17  ALKPHOS 63 55  BILITOT 0.8 1.0  PROT 6.6 6.1*  ALBUMIN 3.5 3.2*   No results for input(s): "LIPASE", "AMYLASE" in the last 168 hours. No results for input(s): "AMMONIA" in the last  168 hours. Coagulation Profile: Recent Labs  Lab 02/15/22 1637  INR 1.2   Cardiac Enzymes: No results for input(s): "CKTOTAL", "CKMB", "CKMBINDEX", "TROPONINI" in the last 168 hours. BNP (last 3 results) No results for input(s): "PROBNP" in the last 8760 hours. HbA1C: No results for input(s): "HGBA1C" in the last 72 hours. CBG: Recent Labs  Lab 02/17/22 0752 02/17/22 1111 02/17/22 1607 02/17/22 1936 02/18/22 0909  GLUCAP 153* 154*  154* 160* 120* 129*   Lipid Profile: No results for input(s): "CHOL", "HDL", "LDLCALC", "TRIG", "CHOLHDL", "LDLDIRECT" in the last 72 hours. Thyroid Function Tests: No results for input(s): "TSH", "T4TOTAL", "FREET4", "T3FREE", "THYROIDAB" in the last 72 hours. Anemia Panel: No results for input(s): "VITAMINB12", "FOLATE", "FERRITIN", "TIBC", "IRON", "RETICCTPCT" in the last 72 hours. Sepsis Labs: No results for input(s): "PROCALCITON", "LATICACIDVEN" in the last 168 hours.  Recent Results (from the past 240 hour(s))  Respiratory (~20 pathogens) panel by PCR     Status: None   Collection Time: 02/16/22  8:36 AM   Specimen: Nasopharyngeal Swab; Respiratory  Result Value Ref Range Status   Adenovirus NOT DETECTED NOT DETECTED Final   Coronavirus 229E NOT DETECTED NOT DETECTED Final    Comment: (NOTE) The Coronavirus on the Respiratory Panel, DOES NOT test for the novel  Coronavirus (2019 nCoV)    Coronavirus HKU1 NOT DETECTED NOT DETECTED Final   Coronavirus NL63 NOT DETECTED NOT DETECTED Final   Coronavirus OC43 NOT DETECTED NOT DETECTED Final   Metapneumovirus NOT DETECTED NOT DETECTED Final   Rhinovirus / Enterovirus NOT DETECTED NOT DETECTED Final   Influenza A NOT DETECTED NOT DETECTED Final   Influenza B NOT DETECTED NOT DETECTED Final   Parainfluenza Virus 1 NOT DETECTED NOT DETECTED Final   Parainfluenza Virus 2 NOT DETECTED NOT DETECTED Final   Parainfluenza Virus 3 NOT DETECTED NOT DETECTED Final   Parainfluenza Virus 4 NOT  DETECTED NOT DETECTED Final   Respiratory Syncytial Virus NOT DETECTED NOT DETECTED Final   Bordetella pertussis NOT DETECTED NOT DETECTED Final   Bordetella Parapertussis NOT DETECTED NOT DETECTED Final   Chlamydophila pneumoniae NOT DETECTED NOT DETECTED Final   Mycoplasma pneumoniae NOT DETECTED NOT DETECTED Final    Comment: Performed at [facility], [place]. [address], [place], [place] [address]  Resp panel by RT-PCR (RSV, Flu A&B, Covid) Anterior Nasal Swab     Status: None   Collection Time: 02/16/22  8:36 AM   Specimen: Anterior Nasal Swab  Result Value Ref Range Status   SARS Coronavirus 2 by RT PCR NEGATIVE NEGATIVE Final    Comment: (NOTE) SARS-CoV-2 target nucleic acids are NOT  DETECTED.  The SARS-CoV-2 RNA is generally detectable in upper respiratory specimens during the acute phase of infection. The lowest concentration of SARS-CoV-2 viral copies this assay can detect is 138 copies/mL. A negative result does not preclude SARS-Cov-2 infection and should not be used as the sole basis for treatment or other patient management decisions. A negative result may occur with  improper specimen collection/handling, submission of specimen other than nasopharyngeal swab, presence of viral mutation(s) within the areas targeted by this assay, and inadequate number of viral copies(<138 copies/mL). A negative result must be combined with clinical observations, patient history, and epidemiological information. The expected result is Negative.  Fact Sheet for Patients:  EntrepreneurPulse.com.au  Fact Sheet for Healthcare Providers:  IncredibleEmployment.be  This test is no t yet approved or cleared by the [place] FDA and  has been authorized for detection and/or diagnosis of SARS-CoV-2 by FDA under an Emergency Use Authorization (EUA). This EUA will remain  in effect (meaning this test can be used) for the duration of the COVID-19  declaration under Section 564(b)(1) of the Act, 21 U.S.C.section 360bbb-3(b)(1), unless the authorization is terminated  or revoked sooner.       Influenza A by PCR NEGATIVE NEGATIVE Final   Influenza B by PCR NEGATIVE NEGATIVE Final    Comment: (NOTE) The Xpert Xpress SARS-CoV-2/FLU/RSV plus assay is intended as an aid in the diagnosis of influenza from Nasopharyngeal swab specimens and should not be used as a sole basis for treatment. Nasal washings and aspirates are unacceptable for Xpert Xpress SARS-CoV-2/FLU/RSV testing.  Fact Sheet for Patients: EntrepreneurPulse.com.au  Fact Sheet for Healthcare Providers: IncredibleEmployment.be  This test is not yet approved or cleared by the [place] FDA and has been authorized for detection and/or diagnosis of SARS-CoV-2 by FDA under an Emergency Use Authorization (EUA). This EUA will remain in effect (meaning this test can be used) for the duration of the COVID-19 declaration under Section 564(b)(1) of the Act, 21 U.S.C. section 360bbb-3(b)(1), unless the authorization is terminated or revoked.     Resp Syncytial Virus by PCR NEGATIVE NEGATIVE Final    Comment: (NOTE) Fact Sheet for Patients: EntrepreneurPulse.com.au  Fact Sheet for Healthcare Providers: IncredibleEmployment.be  This test is not yet approved or cleared by the [place] FDA and has been authorized for detection and/or diagnosis of SARS-CoV-2 by FDA under an Emergency Use Authorization (EUA). This EUA will remain in effect (meaning this test can be used) for the duration of the COVID-19 declaration under Section 564(b)(1) of the Act, 21 U.S.C. section 360bbb-3(b)(1), unless the authorization is terminated or revoked.  Performed at [facility], [place] [address], [place],  [address]          Radiology Studies: No results found.      Scheduled Meds:   guaiFENesin  600 mg Oral BID   insulin aspart  0-9 Units Subcutaneous TID WC   polyethylene glycol  17 g Oral BID   prochlorperazine  10 mg Intravenous Q6H   simvastatin  20 mg Oral q1800   sodium chloride flush  3 mL Intravenous Q12H   Continuous Infusions:   LOS: 3 days    Time spent: 45 minutes spent on chart review, discussion with nursing staff, consultants, updating family and interview/physical exam; more than 50% of that time was spent in counseling and/or coordination of care.    Geradine Girt, DO [facility] Available via Epic secure chat 7am-7pm After these hours, please refer to coverage provider  listed on [email or web address] 02/18/2022, 12:25 PM

## 2022-02-18 NOTE — Op Note (Signed)
Date: 02/18/2022   PRE-OPERATIVE DIAGNOSIS:  Right four-part proximal humerus fracture   POST-OPERATIVE DIAGNOSIS:  Same   PROCEDURE:  1.  Right REVERSE SHOULDER ARTHROPLASTY    SURGEON:  Nicholes Stairs, MD   ASSISTANT: Jonelle Sidle, PA-C  Assistant attestation:  PA Thereasa Solo was present for the entire procedure.   ANESTHESIA:   General with a block   ESTIMATED BLOOD LOSS: See anesthesia record   PREOPERATIVE INDICATIONS: Cindy Whitehead is a 86 year old right-hand-dominant female who sustained a bilateral proximal humerus fracture following a fall.  The left proximal humerus fracture is relatively well aligned and meets criteria for nonoperative treatment.  However, the right is significantly displaced four-part proximal humerus fracture.  Due to the comminution and displaced nature of the fracture and head splitting components we discussed operative management.  We discussed moving forward with reverse shoulder arthroplasty for his injury to allow earlier weight bearing on a walker and/or cane as needed and lower risk of AVN, non union or progression of shoulder arthritis following the fracture. Thus we elected to proceed with reverse shoulder arthroplasty for the plan.  The risks benefits and alternatives were discussed with the patient preoperatively including but not limited to the risks of infection, bleeding, nerve injury, cardiopulmonary complications, the need for revision surgery, dislocation, brachial plexus palsy, incomplete relief of pain, among others, and the patient was willing to proceed. The patient did provided informed consent.   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  She had intact long head of biceps as well as rotator cuff muscles x 4.  However, did have a significant displaced surgical neck fracture with anterior medialization of the shaft just up against the conjoined tendon.  She had a comminuted fracture of the greater tuberosity as well as lesser tuberosity.  There was some involvement of the head but otherwise the head was noted to have significant arthritic change.   OPERATIVE PROCEDURE: The patient was brought to the operating room and placed in the supine position. General anesthesia was administered. IV antibiotics were given. Time out was performed. The upper extremity was prepped and draped in usual sterile fashion. The patient was in a beachchair position. Deltopectoral approach was carried out. After dissection through skin and subcutaneous fat, the cephalic vein was identified with the deltopectoral interval.  This was mobilized and taken lateral.   The fracture was identified and working through the fracture in the biceps groove the lesser and greater tuberosities were freed up and tagged with #2 Fiber wire sutures.  The humeral head was fragmented and removed from the wound.     Next, the long head of the biceps tendon was released in situ.   I then performed circumferential releases of the humerus.  I then moved to sizing the humerus.  There was moderate calcar bone loss and this was used to reference the height of the stem, as was the upper border of the pectoralis major tendon.  The canal was reamed and found to fit best with a 9 mm fracture stem.   We next turned to the glenoid.  Deep retractors were placed, and I resected the labrum as well as the residual long head of biceps,  and then placed a guidepin into the center position on the glenoid, with slight inferior declination. I then reamed over the guidepin, and this created a small metaphyseal cancellus blush inferiorly, removing just the cartilage to the subchondral  bone superiorly. The base plate was selected and screwed into place, and then I secured it centrally with a nonlocking screw, and I had excellent purchase both inferiorly and superiorly. I placed a short locking screws on anterior aspect,  And posterior aspect.   I then turned my attention to the glenosphere, and impacted this into place, and then placed the central setscrew.     The 9 mm fracture stem was seated to the appropriate height to allow approximate 5.6 cm from the top of the Pectoralis major tendon to the top of the glenosphere.  The stem was placed in 30 degrees of retroversion.   Once the stem was impacted into place we trialed poly liners.  Using the standard humeral metaglen,  The shoulder had excellent motion, and was stable.  The final poly was impacted and again showed good motion and stability.  Next, I irrigated the wounds copiously.    The greater tuberosity was brought back to the humeral stem suture holes and secured with bone graft from the humeral head as augment.  Subscapularis was not repaired.   I then irrigated the shoulder copiously once more, placed 1 g of vancomycin powder into the wound, repaired the deltopectoral interval with suture tape, followed by subcutaneous monocryl and then subcuticular monocryl with Steri-Strips and sterile gauze for the skin. The patient was awakened and returned back in stable and satisfactory condition. There no complications and he tolerated the procedure well.  All counts were correct.  The patient awakened from general anesthesia with no complications and transferred to PACU in stable condition.   Postoperative Plan: Cindy Whitehead will remain in her sling until her regional block has worn off, but is ok to remove it for use with a walker.  Also may begin using the right upper extremity below shoulder height for activities of daily living.  No lifting over 2 pounds.  The left upper extremity will be in a sling at all times and  nonweightbearing for 3 weeks.  She will return to the medicine floor for postoperative therapy and dispositional planning.  Our recommendation is for 81 mg aspirin twice daily x 6 weeks for DVT prophylaxis.

## 2022-02-19 DIAGNOSIS — W19XXXA Unspecified fall, initial encounter: Secondary | ICD-10-CM | POA: Diagnosis not present

## 2022-02-19 DIAGNOSIS — S42222A 2-part displaced fracture of surgical neck of left humerus, initial encounter for closed fracture: Secondary | ICD-10-CM | POA: Diagnosis not present

## 2022-02-19 LAB — CBC
HCT: 29.3 % — ABNORMAL LOW (ref 36.0–46.0)
Hemoglobin: 10.1 g/dL — ABNORMAL LOW (ref 12.0–15.0)
MCH: 33 pg (ref 26.0–34.0)
MCHC: 34.5 g/dL (ref 30.0–36.0)
MCV: 95.8 fL (ref 80.0–100.0)
Platelets: 167 10*3/uL (ref 150–400)
RBC: 3.06 MIL/uL — ABNORMAL LOW (ref 3.87–5.11)
RDW: 12.2 % (ref 11.5–15.5)
WBC: 8.6 10*3/uL (ref 4.0–10.5)
nRBC: 0 % (ref 0.0–0.2)

## 2022-02-19 LAB — GLUCOSE, CAPILLARY
Glucose-Capillary: 152 mg/dL — ABNORMAL HIGH (ref 70–99)
Glucose-Capillary: 143 mg/dL — ABNORMAL HIGH (ref 70–99)
Glucose-Capillary: 130 mg/dL — ABNORMAL HIGH (ref 70–99)
Glucose-Capillary: 134 mg/dL — ABNORMAL HIGH (ref 70–99)

## 2022-02-19 LAB — BASIC METABOLIC PANEL
Anion gap: 8 (ref 5–15)
BUN: 19 mg/dL (ref 8–23)
CO2: 25 mmol/L (ref 22–32)
Calcium: 8.4 mg/dL — ABNORMAL LOW (ref 8.9–10.3)
Chloride: 104 mmol/L (ref 98–111)
Creatinine, Ser: 0.89 mg/dL (ref 0.44–1.00)
GFR, Estimated: >60 mL/min (ref >60)
Glucose, Bld: 164 mg/dL — ABNORMAL HIGH (ref 70–99)
Potassium: 4.5 mmol/L (ref 3.5–5.1)
Sodium: 137 mmol/L (ref 135–145)

## 2022-02-19 MED ORDER — ACETAMINOPHEN 500 MG PO TABS
1000.0000 mg | ORAL_TABLET | Freq: Three times a day (TID) | ORAL | Status: AC
  Administered 2022-02-19 – 2022-02-21 (×7): 1000 mg via ORAL
  Filled 2022-02-19 (×9): qty 2

## 2022-02-19 MED ORDER — SODIUM CHLORIDE 0.9% FLUSH: 10.0000 mL | INTRAVENOUS | Status: DC | PRN

## 2022-02-19 NOTE — Progress Notes (Signed)
Non-essential morning Medications held.  Patient very lethargic and drowsy.  Daughter at bedside believes it is the pain medication.  Will continue to monitor.

## 2022-02-19 NOTE — Anesthesia Postprocedure Evaluation (Signed)
Anesthesia Post Note  Patient: RAVEN FURNAS  Procedure(s) Performed: REVERSE SHOULDER ARTHROPLASTY (Right: Shoulder)     Patient location during evaluation: PACU Anesthesia Type: Regional and General Level of consciousness: awake and alert Pain management: pain level controlled Vital Signs Assessment: post-procedure vital signs reviewed and stable Respiratory status: spontaneous breathing, nonlabored ventilation and respiratory function stable Cardiovascular status: blood pressure returned to baseline and stable Postop Assessment: no apparent nausea or vomiting Anesthetic complications: no  No notable events documented.  Last Vitals:  Vitals:   02/18/22 2142 02/19/22 0215  BP: (!) 145/72 136/69  Pulse: 87 93  Resp: 17 16  Temp: 36.9 C 36.5 C  SpO2: 100% 98%    Last Pain:  Vitals:   02/19/22 0215  TempSrc: Oral  PainSc:                  Aimar Borghi,W. EDMOND

## 2022-02-19 NOTE — Progress Notes (Signed)
PROGRESS NOTE    Cindy Whitehead  JQB:[id number] DOB: 17-Jun-1935 DOA: 02/15/2022 PCP: Abner Greenspan, MD    Brief Narrative:  Cindy Whitehead is a 86 y.o. female from home with medical history significant of hyperlipidemia, diabetes, GERD, asthma, hypertension, obesity, low back pain, adjustment disorder with anxiety and depression presenting after fall.   Patient states that she was walking to take out her trash and tripped and fell.  She put both her arms out to break her fall and after landing on her arm she has pain in both arms right greater than left.  She additionally reports some tingling in her right hand.  She states that she did not hit her head nor lose consciousness.   S/p repair on right arm.  Will need SNF   Assessment and Plan: Right humerus fracture/Left humerus fracture s/p Fall > Patient presenting after mechanical fall while taking trash out.  She put both her arms out to catch herself and after catching herself she has had pain in both arms right greater than left. Imaging in ED showed bilateral humerus fractures.   -Patient evaluated by orthopedics or getting> CT scans of both shoulders plan for reverse total shoulder on the right on Friday and possible nonoperative management on the left pending CT results.-- await ortho consult - schedule tylenol, PRN pain meds cautiously   URI -xray without pna -NP swabs negative -pulm toilet- needs incentive spirometry  Hyperlipidemia - Continue home simvastatin   Diabetes - SSI   Hypertension GERD Asthma  - Listed in chart but not currently on any medications  -PRN nebs   Obesity Estimated body mass index is 34.18 kg/m as calculated from the following:   Height as of this encounter: 5' (1.524 m).   Weight as of this encounter: 79.4 kg.    DVT prophylaxis: SCD's Start: 02/18/22 1746 SCDs Start: 02/15/22 1440    Code Status: Full Code Family Communication: daughter at bedside  Disposition Plan:  Level of care:  Med-Surg Status is: Inpatient Remains inpatient appropriate because:needs SNF    Consultants:  ortho   Subjective: No further vomiting today   Objective: Vitals:   02/18/22 1802 02/18/22 2142 02/19/22 0215 02/19/22 0740  BP: (!) 163/80 (!) 145/72 136/69 (!) 140/77  Pulse: 97 87 93 71  Resp: '18 17 16 16  '$ Temp: 98.2 F (36.8 C) 98.4 F (36.9 C) 97.7 F (36.5 C) 98.6 F (37 C)  TempSrc: Oral Oral Oral Oral  SpO2: 94% 100% 98% 98%  Weight:      Height:        Intake/Output Summary (Last 24 hours) at 02/19/2022 1254 Last data filed at 02/19/2022 0815 Gross per 24 hour  Intake 1660 ml  Output 600 ml  Net 1060 ml   Filed Weights   02/15/22 0938 02/18/22 1334  Weight: 79.4 kg 79.4 kg    Examination:    General: Appearance:    Obese female in no acute distress     Lungs:     respirations unlabored  Heart:    Normal heart rate.    MS:   All extremities are intact.   Neurologic:   Will awaken to voice         Data Reviewed: I have personally reviewed following labs and imaging studies  CBC: Recent Labs  Lab 02/15/22 1637 02/16/22 0354 02/18/22 0339 02/19/22 0317  WBC 13.6* 12.6* 10.1 8.6  NEUTROABS 12.5*  --   --   --  HGB 12.5 11.6* 10.2* 10.1*  HCT 38.1 32.9* 30.8* 29.3*  MCV 98.7 92.4 97.2 95.8  PLT 159 183 165 381   Basic Metabolic Panel: Recent Labs  Lab 02/15/22 1637 02/16/22 0354 02/18/22 0339 02/19/22 0317  NA 138 137 135 137  K 4.1 4.3 4.3 4.5  CL 106 105 103 104  CO2 17* '22 24 25  '$ GLUCOSE 167* 143* 157* 164*  BUN 17 25* 21 19  CREATININE 0.78 0.92 0.84 0.89  CALCIUM 8.8* 8.8* 8.5* 8.4*   GFR: Estimated Creatinine Clearance: 43.9 mL/min (by C-G formula based on SCr of 0.89 mg/dL). Liver Function Tests: Recent Labs  Lab 02/15/22 1637 02/16/22 0354  AST 32 26  ALT 18 17  ALKPHOS 63 55  BILITOT 0.8 1.0  PROT 6.6 6.1*  ALBUMIN 3.5 3.2*   No results for input(s): "LIPASE", "AMYLASE" in the last 168 hours. No results for  input(s): "AMMONIA" in the last 168 hours. Coagulation Profile: Recent Labs  Lab 02/15/22 1637  INR 1.2   Cardiac Enzymes: No results for input(s): "CKTOTAL", "CKMB", "CKMBINDEX", "TROPONINI" in the last 168 hours. BNP (last 3 results) No results for input(s): "PROBNP" in the last 8760 hours. HbA1C: No results for input(s): "HGBA1C" in the last 72 hours. CBG: Recent Labs  Lab 02/18/22 1236 02/18/22 1702 02/18/22 1932 02/19/22 0744 02/19/22 1202  GLUCAP 137* 150* 157* 152* 143*   Lipid Profile: No results for input(s): "CHOL", "HDL", "LDLCALC", "TRIG", "CHOLHDL", "LDLDIRECT" in the last 72 hours. Thyroid Function Tests: No results for input(s): "TSH", "T4TOTAL", "FREET4", "T3FREE", "THYROIDAB" in the last 72 hours. Anemia Panel: No results for input(s): "VITAMINB12", "FOLATE", "FERRITIN", "TIBC", "IRON", "RETICCTPCT" in the last 72 hours. Sepsis Labs: No results for input(s): "PROCALCITON", "LATICACIDVEN" in the last 168 hours.  Recent Results (from the past 240 hour(s))  Respiratory (~20 pathogens) panel by PCR     Status: None   Collection Time: 02/16/22  8:36 AM   Specimen: Nasopharyngeal Swab; Respiratory  Result Value Ref Range Status   Adenovirus NOT DETECTED NOT DETECTED Final   Coronavirus 229E NOT DETECTED NOT DETECTED Final    Comment: (NOTE) The Coronavirus on the Respiratory Panel, DOES NOT test for the novel  Coronavirus (2019 nCoV)    Coronavirus HKU1 NOT DETECTED NOT DETECTED Final   Coronavirus NL63 NOT DETECTED NOT DETECTED Final   Coronavirus OC43 NOT DETECTED NOT DETECTED Final   Metapneumovirus NOT DETECTED NOT DETECTED Final   Rhinovirus / Enterovirus NOT DETECTED NOT DETECTED Final   Influenza A NOT DETECTED NOT DETECTED Final   Influenza B NOT DETECTED NOT DETECTED Final   Parainfluenza Virus 1 NOT DETECTED NOT DETECTED Final   Parainfluenza Virus 2 NOT DETECTED NOT DETECTED Final   Parainfluenza Virus 3 NOT DETECTED NOT DETECTED Final    Parainfluenza Virus 4 NOT DETECTED NOT DETECTED Final   Respiratory Syncytial Virus NOT DETECTED NOT DETECTED Final   Bordetella pertussis NOT DETECTED NOT DETECTED Final   Bordetella Parapertussis NOT DETECTED NOT DETECTED Final   Chlamydophila pneumoniae NOT DETECTED NOT DETECTED Final   Mycoplasma pneumoniae NOT DETECTED NOT DETECTED Final    Comment: Performed at [facility], [place]. [address], [place], [place] [address]  Resp panel by RT-PCR (RSV, Flu A&B, Covid) Anterior Nasal Swab     Status: None   Collection Time: 02/16/22  8:36 AM   Specimen: Anterior Nasal Swab  Result Value Ref Range Status   SARS Coronavirus 2 by RT PCR NEGATIVE NEGATIVE  Final    Comment: (NOTE) SARS-CoV-2 target nucleic acids are NOT DETECTED.  The SARS-CoV-2 RNA is generally detectable in upper respiratory specimens during the acute phase of infection. The lowest concentration of SARS-CoV-2 viral copies this assay can detect is 138 copies/mL. A negative result does not preclude SARS-Cov-2 infection and should not be used as the sole basis for treatment or other patient management decisions. A negative result may occur with  improper specimen collection/handling, submission of specimen other than nasopharyngeal swab, presence of viral mutation(s) within the areas targeted by this assay, and inadequate number of viral copies(<138 copies/mL). A negative result must be combined with clinical observations, patient history, and epidemiological information. The expected result is Negative.  Fact Sheet for Patients:  EntrepreneurPulse.com.au  Fact Sheet for Healthcare Providers:  IncredibleEmployment.be  This test is no t yet approved or cleared by the [place] FDA and  has been authorized for detection and/or diagnosis of SARS-CoV-2 by FDA under an Emergency Use Authorization (EUA). This EUA will remain  in effect (meaning this test can be used) for the duration  of the COVID-19 declaration under Section 564(b)(1) of the Act, 21 U.S.C.section 360bbb-3(b)(1), unless the authorization is terminated  or revoked sooner.       Influenza A by PCR NEGATIVE NEGATIVE Final   Influenza B by PCR NEGATIVE NEGATIVE Final    Comment: (NOTE) The Xpert Xpress SARS-CoV-2/FLU/RSV plus assay is intended as an aid in the diagnosis of influenza from Nasopharyngeal swab specimens and should not be used as a sole basis for treatment. Nasal washings and aspirates are unacceptable for Xpert Xpress SARS-CoV-2/FLU/RSV testing.  Fact Sheet for Patients: EntrepreneurPulse.com.au  Fact Sheet for Healthcare Providers: IncredibleEmployment.be  This test is not yet approved or cleared by the [place] FDA and has been authorized for detection and/or diagnosis of SARS-CoV-2 by FDA under an Emergency Use Authorization (EUA). This EUA will remain in effect (meaning this test can be used) for the duration of the COVID-19 declaration under Section 564(b)(1) of the Act, 21 U.S.C. section 360bbb-3(b)(1), unless the authorization is terminated or revoked.     Resp Syncytial Virus by PCR NEGATIVE NEGATIVE Final    Comment: (NOTE) Fact Sheet for Patients: EntrepreneurPulse.com.au  Fact Sheet for Healthcare Providers: IncredibleEmployment.be  This test is not yet approved or cleared by the [place] FDA and has been authorized for detection and/or diagnosis of SARS-CoV-2 by FDA under an Emergency Use Authorization (EUA). This EUA will remain in effect (meaning this test can be used) for the duration of the COVID-19 declaration under Section 564(b)(1) of the Act, 21 U.S.C. section 360bbb-3(b)(1), unless the authorization is terminated or revoked.  Performed at [facility], [place] [address], [place], [place] [address]   Surgical pcr screen     Status: Abnormal   Collection Time: 02/18/22  12:13 PM   Specimen: Nasal Mucosa; Nasal Swab  Result Value Ref Range Status   MRSA, PCR NEGATIVE NEGATIVE Final   Staphylococcus aureus POSITIVE (A) NEGATIVE Final    Comment: (NOTE) The Xpert SA Assay (FDA approved for NASAL specimens in patients 71 years of age and older), is one component of a comprehensive surveillance program. It is not intended to diagnose infection nor to guide or monitor treatment. Performed at [facility], [place] [address] [place], [place] [address]          Radiology Studies: DG Shoulder Right Port  Result Date: 02/18/2022 CLINICAL DATA:  Status post shoulder arthroplasty  EXAM: RIGHT SHOULDER - 1 VIEW COMPARISON:  Right shoulder x-ray 02/15/2022 FINDINGS: There is a new right shoulder total arthroplasty in anatomic alignment. There is overlying soft tissue swelling and air compatible with recent surgery. No fracture. There are mild degenerative changes of the acromioclavicular joint. IMPRESSION: Right shoulder total arthroplasty in anatomic alignment. Electronically Signed   By: Ronney Asters M.D.   On: 02/18/2022 17:17        Scheduled Meds:  acetaminophen  1,000 mg Oral TID   Chlorhexidine Gluconate Cloth  6 each Topical Q0600   docusate sodium  100 mg Oral BID   guaiFENesin  600 mg Oral BID   insulin aspart  0-9 Units Subcutaneous TID WC   mupirocin ointment  1 Application Nasal BID   polyethylene glycol  17 g Oral BID   prochlorperazine  10 mg Intravenous Q6H   simvastatin  20 mg Oral q1800   sodium chloride flush  3 mL Intravenous Q12H   Continuous Infusions:   LOS: 4 days    Time spent: 45 minutes spent on chart review, discussion with nursing staff, consultants, updating family and interview/physical exam; more than 50% of that time was spent in counseling and/or coordination of care.    Geradine Girt, DO [facility] Available via Epic secure chat 7am-7pm After these hours, please refer to coverage provider listed  on [email or web address] 02/19/2022, 12:54 PM

## 2022-02-19 NOTE — Evaluation (Signed)
Occupational Therapy Evaluation Patient Details Name: Cindy Whitehead MRN: [id number] DOB: January 10, 1936 Today's Date: 02/19/2022   History of Present Illness 86 y.o. female with medical history significant of hyperlipidemia, diabetes, GERD, asthma, hypertension, obesity, low back pain, adjustment disorder with anxiety and depression presenting after fall.  B humeral fx.  S/p R reverse TSA.  L UE non operative.   Clinical Impression   Patient admitted for the diagnosis and procedure above.  PTA she lives alone with her dig, and needed only grass cutting assist from her SIL.  Deficits impacting independence are listed below.  Currently she is needing up to Mod A for in room mobility, and Max A to total A for ADL completion due to injuries to both arms, and continued effects of shoulder block.  OT indicated in the acute setting to maximize her functional status, and assist with her eventual transition to SNF for short term rehab.      Recommendations for follow up therapy are one component of a multi-disciplinary discharge planning process, led by the attending physician.  Recommendations may be updated based on patient status, additional functional criteria and insurance authorization.   Follow Up Recommendations  Skilled nursing-short term rehab (<3 hours/day)     Assistance Recommended at Discharge Frequent or constant Supervision/Assistance  Patient can return home with the following Help with stairs or ramp for entrance;Assist for transportation;Assistance with cooking/housework;A lot of help with walking and/or transfers;A lot of help with bathing/dressing/bathroom    Functional Status Assessment  Patient has had a recent decline in their functional status and demonstrates the ability to make significant improvements in function in a reasonable and predictable amount of time.  Equipment Recommendations  None recommended by OT    Recommendations for Other Services       Precautions /  Restrictions Precautions Precautions: Fall;Shoulder Type of Shoulder Precautions: R reverse TSA Shoulder Interventions: Shoulder sling/immobilizer Precaution Booklet Issued: No Precaution Comments: Elbow distal ROM allowed.  R shoulder flexion to 90 degress only.  L sling off for ADL and HEP. Restrictions Weight Bearing Restrictions: Yes RUE Weight Bearing: Weight bearing as tolerated LUE Weight Bearing: Non weight bearing Other Position/Activity Restrictions: ? cane versus hemi walker      Mobility Bed Mobility Overal bed mobility: Needs Assistance Bed Mobility: Supine to Sit     Supine to sit: Mod assist, HOB elevated          Transfers Overall transfer level: Needs assistance   Transfers: Sit to/from Stand, Bed to chair/wheelchair/BSC Sit to Stand: Mod assist     Step pivot transfers: Mod assist            Balance Overall balance assessment: Needs assistance Sitting-balance support: Feet supported Sitting balance-Leahy Scale: Good     Standing balance support: No upper extremity supported Standing balance-Leahy Scale: Fair Standing balance comment: short choppy steps                           ADL either performed or assessed with clinical judgement   ADL Overall ADL's : Needs assistance/impaired Eating/Feeding: Total assistance;Sitting   Grooming: Sitting;Total assistance   Upper Body Bathing: Total assistance;Sitting   Lower Body Bathing: Total assistance;Sit to/from stand   Upper Body Dressing : Maximal assistance;Sitting   Lower Body Dressing: Total assistance;Sit to/from stand   Toilet Transfer: Moderate assistance;Regular Toilet;Ambulation   Toileting- Clothing Manipulation and Hygiene: Total assistance;Sit to/from stand  Vision Patient Visual Report: No change from baseline       Perception     Praxis      Pertinent Vitals/Pain Pain Assessment Pain Assessment: Faces Faces Pain Scale: Hurts little  more Pain Location: R shoulder Pain Descriptors / Indicators: Sore Pain Intervention(s): Monitored during session     Hand Dominance Right   Extremity/Trunk Assessment Upper Extremity Assessment Upper Extremity Assessment: RUE deficits/detail RUE Deficits / Details: effects of block remain   Lower Extremity Assessment Lower Extremity Assessment: Defer to PT evaluation   Cervical / Trunk Assessment Cervical / Trunk Assessment: Kyphotic   Communication Communication Communication: No difficulties   Cognition Arousal/Alertness: Awake/alert Behavior During Therapy: WFL for tasks assessed/performed Overall Cognitive Status: Within Functional Limits for tasks assessed                                       General Comments   VSS on RA    Exercises     Shoulder Instructions      Home Living Family/patient expects to be discharged to:: Skilled nursing facility Living Arrangements: Alone                               Additional Comments: Plan will be for her to attend [facility], then live with her daughter and SIL until she can transition back home      Prior Functioning/Environment Prior Level of Function : Independent/Modified Independent;Driving             Mobility Comments: No assist or AD needed ADLs Comments: Ind with ADL and iADL.  Continues to drive.        OT Problem List: Decreased strength;Decreased range of motion;Decreased activity tolerance;Impaired balance (sitting and/or standing);Impaired UE functional use;Pain      OT Treatment/Interventions: Self-care/ADL training;Therapeutic activities;Therapeutic exercise;Patient/family education;Balance training;DME and/or AE instruction    OT Goals(Current goals can be found in the care plan section) Acute Rehab OT Goals Patient Stated Goal: Go to rehab OT Goal Formulation: With patient Time For Goal Achievement: 03/04/22 Potential to Achieve Goals: Good ADL Goals Pt Will  Perform Eating: with set-up;sitting Pt Will Transfer to Toilet: with supervision;ambulating;regular height toilet Pt/caregiver will Perform Home Exercise Program: Increased ROM;Both right and left upper extremity;With Supervision;With written HEP provided  OT Frequency: Min 2X/week    Co-evaluation              AM-PAC OT "6 Clicks" Daily Activity     Outcome Measure Help from another person eating meals?: Total Help from another person taking care of personal grooming?: Total Help from another person toileting, which includes using toliet, bedpan, or urinal?: A Lot Help from another person bathing (including washing, rinsing, drying)?: A Lot Help from another person to put on and taking off regular upper body clothing?: A Lot Help from another person to put on and taking off regular lower body clothing?: Total 6 Click Score: 9   End of Session Equipment Utilized During Treatment: Gait belt Nurse Communication: Mobility status  Activity Tolerance: Patient tolerated treatment well Patient left: in chair;with call bell/phone within reach;with family/visitor present  OT Visit Diagnosis: Unsteadiness on feet (R26.81);History of falling (Z91.81);Pain Pain - Right/Left: Right Pain - part of body: Shoulder                Time: 1140-1210 OT Time  Calculation (min): 30 min Charges:  OT General Charges $OT Visit: 1 Visit OT Evaluation $OT Eval Moderate Complexity: 1 Mod OT Treatments $Self Care/Home Management : 8-22 mins  02/19/2022  RP, OTR/L  Acute Rehabilitation Services  Office:  [place] 02/19/2022, 12:22 PM

## 2022-02-19 NOTE — Progress Notes (Signed)
   Subjective: 1 Day Post-Op Procedure(s) (LRB): REVERSE SHOULDER ARTHROPLASTY (Right)  Pt c/o mild pain in bilateral shoulders Currently in bilateral slings Denies any numbness or tingling distally No new symptoms overnight Patient reports pain as mild.  Objective:   VITALS:   Vitals:   02/19/22 0215 02/19/22 0740  BP: 136/69 (!) 140/77  Pulse: 93 71  Resp: 16 16  Temp: 97.7 F (36.5 C) 98.6 F (37 C)  SpO2: 98% 98%    Right shoulder incision healing well Nv intact distally Slings to bilateral upper extremities in good position No signs of infection or dvt  LABS Recent Labs    02/18/22 0339 02/19/22 0317  HGB 10.2* 10.1*  HCT 30.8* 29.3*  WBC 10.1 8.6  PLT 165 167    Recent Labs    02/18/22 0339 02/19/22 0317  NA 135 137  K 4.3 4.5  BUN 21 19  CREATININE 0.84 0.89  GLUCOSE 157* 164*     Assessment/Plan: 1 Day Post-Op Procedure(s) (LRB): REVERSE SHOULDER ARTHROPLASTY (Right) S/p right reverse total shoulder for fracture and left humerus fracture plan to treat conservatively  PT/OT as able Pain management Pulmonary toilet Slings for comfort    Brad Luna [place], [place] [facility] now [employer] [facility] [address], [address], [place], [place] [address] Phone: [phone] [email or web address]  [employer]

## 2022-02-20 DIAGNOSIS — S42291A Other displaced fracture of upper end of right humerus, initial encounter for closed fracture: Secondary | ICD-10-CM | POA: Diagnosis not present

## 2022-02-20 LAB — CBC
HCT: 28.1 % — ABNORMAL LOW (ref 36.0–46.0)
Hemoglobin: 9.7 g/dL — ABNORMAL LOW (ref 12.0–15.0)
MCH: 33 pg (ref 26.0–34.0)
MCHC: 34.5 g/dL (ref 30.0–36.0)
MCV: 95.6 fL (ref 80.0–100.0)
Platelets: 182 10*3/uL (ref 150–400)
RBC: 2.94 MIL/uL — ABNORMAL LOW (ref 3.87–5.11)
RDW: 12.6 % (ref 11.5–15.5)
WBC: 11.4 10*3/uL — ABNORMAL HIGH (ref 4.0–10.5)
nRBC: 0.2 % (ref 0.0–0.2)

## 2022-02-20 LAB — GLUCOSE, CAPILLARY
Glucose-Capillary: 107 mg/dL — ABNORMAL HIGH (ref 70–99)
Glucose-Capillary: 147 mg/dL — ABNORMAL HIGH (ref 70–99)
Glucose-Capillary: 116 mg/dL — ABNORMAL HIGH (ref 70–99)
Glucose-Capillary: 131 mg/dL — ABNORMAL HIGH (ref 70–99)

## 2022-02-20 LAB — BASIC METABOLIC PANEL
Anion gap: 7 (ref 5–15)
BUN: 22 mg/dL (ref 8–23)
CO2: 27 mmol/L (ref 22–32)
Calcium: 8.3 mg/dL — ABNORMAL LOW (ref 8.9–10.3)
Chloride: 104 mmol/L (ref 98–111)
Creatinine, Ser: 0.78 mg/dL (ref 0.44–1.00)
GFR, Estimated: >60 mL/min (ref >60)
Glucose, Bld: 125 mg/dL — ABNORMAL HIGH (ref 70–99)
Potassium: 4.3 mmol/L (ref 3.5–5.1)
Sodium: 138 mmol/L (ref 135–145)

## 2022-02-20 MED ORDER — TRAMADOL HCL 50 MG PO TABS
50.0000 mg | ORAL_TABLET | Freq: Four times a day (QID) | ORAL | Status: DC | PRN
  Administered 2022-02-21 – 2022-02-23 (×4): 50 mg via ORAL
  Filled 2022-02-20 (×5): qty 1

## 2022-02-20 NOTE — Progress Notes (Signed)
PROGRESS NOTE    BECKA LAGASSE  OVZ:[id number] DOB: 22-Nov-1935 DOA: 02/15/2022 PCP: Abner Greenspan, MD    Brief Narrative:  Cindy Whitehead is a 86 y.o. female from home with medical history significant of hyperlipidemia, diabetes, GERD, asthma, hypertension, obesity, low back pain, adjustment disorder with anxiety and depression presenting after fall.   Patient states that she was walking to take out her trash and tripped and fell.  She put both her arms out to break her fall and after landing on her arm she has pain in both arms right greater than left.  She additionally reports some tingling in her right hand.  She states that she did not hit her head nor lose consciousness.   S/p repair on right arm.  Will need SNF   Assessment and Plan: Right humerus fracture/Left humerus fracture s/p Fall > Patient presenting after mechanical fall while taking trash out.  She put both her arms out to catch herself and after catching herself she has had pain in both arms right greater than left. Imaging in ED showed bilateral humerus fractures.   -Patient evaluated by orthopedics or getting> CT scans of both shoulders plan for reverse total shoulder on the right on Friday and possible nonoperative management on the left pending CT results.-- await ortho consult - schedule tylenol, PRN pain meds cautiously -PT/OT- SNF   URI- resolved -xray without pna -NP swabs negative -pulm toilet- needs incentive spirometry (I brought to her myself)  Hyperlipidemia - Continue home simvastatin   Diabetes - SSI   Hypertension GERD Asthma  - Listed in chart but not currently on any medications  -PRN nebs   Obesity Estimated body mass index is 34.18 kg/m as calculated from the following:   Height as of this encounter: 5' (1.524 m).   Weight as of this encounter: 79.4 kg.    DVT prophylaxis: SCD's Start: 02/18/22 1746 SCDs Start: 02/15/22 1440    Code Status: Full Code Family Communication: daughter  at bedside  Disposition Plan:  Level of care: Med-Surg Status is: Inpatient Remains inpatient appropriate because:needs SNF    Consultants:  ortho   Subjective: Says she still has 3 gifts left to get for Christmas   Objective: Vitals:   02/19/22 1500 02/19/22 2053 02/20/22 0422 02/20/22 0913  BP: (!) 151/76 136/68 (!) 116/59 (!) 140/62  Pulse: 79  60 68  Resp: '16 18 19 18  '$ Temp: 98.5 F (36.9 C) 98.1 F (36.7 C) 98 F (36.7 C) 98 F (36.7 C)  TempSrc: Oral Oral Oral   SpO2: 93% 98% 96% 93%  Weight:      Height:        Intake/Output Summary (Last 24 hours) at 02/20/2022 1208 Last data filed at 02/20/2022 0945 Gross per 24 hour  Intake 240 ml  Output 400 ml  Net -160 ml   Filed Weights   02/15/22 0938 02/18/22 1334  Weight: 79.4 kg 79.4 kg    Examination:    General: Appearance:    Obese female in no acute distress     Lungs:     respirations unlabored  Heart:    Normal heart rate. Normal rhythm. No murmurs, rubs, or gallops.   MS:   All extremities are intact.   Neurologic:   Awake, alert- more awake today      Data Reviewed: I have personally reviewed following labs and imaging studies  CBC: Recent Labs  Lab 02/15/22 1637 02/16/22 0354 02/18/22 0339 02/19/22 4128  02/20/22 0253  WBC 13.6* 12.6* 10.1 8.6 11.4*  NEUTROABS 12.5*  --   --   --   --   HGB 12.5 11.6* 10.2* 10.1* 9.7*  HCT 38.1 32.9* 30.8* 29.3* 28.1*  MCV 98.7 92.4 97.2 95.8 95.6  PLT 159 183 165 167 220   Basic Metabolic Panel: Recent Labs  Lab 02/15/22 1637 02/16/22 0354 02/18/22 0339 02/19/22 0317 02/20/22 0253  NA 138 137 135 137 138  K 4.1 4.3 4.3 4.5 4.3  CL 106 105 103 104 104  CO2 17* '22 24 25 27  '$ GLUCOSE 167* 143* 157* 164* 125*  BUN 17 25* '21 19 22  '$ CREATININE 0.78 0.92 0.84 0.89 0.78  CALCIUM 8.8* 8.8* 8.5* 8.4* 8.3*   GFR: Estimated Creatinine Clearance: 48.8 mL/min (by C-G formula based on SCr of 0.78 mg/dL). Liver Function Tests: Recent Labs  Lab  02/15/22 1637 02/16/22 0354  AST 32 26  ALT 18 17  ALKPHOS 63 55  BILITOT 0.8 1.0  PROT 6.6 6.1*  ALBUMIN 3.5 3.2*   No results for input(s): "LIPASE", "AMYLASE" in the last 168 hours. No results for input(s): "AMMONIA" in the last 168 hours. Coagulation Profile: Recent Labs  Lab 02/15/22 1637  INR 1.2   Cardiac Enzymes: No results for input(s): "CKTOTAL", "CKMB", "CKMBINDEX", "TROPONINI" in the last 168 hours. BNP (last 3 results) No results for input(s): "PROBNP" in the last 8760 hours. HbA1C: No results for input(s): "HGBA1C" in the last 72 hours. CBG: Recent Labs  Lab 02/19/22 0744 02/19/22 1202 02/19/22 1653 02/19/22 2034 02/20/22 0813  GLUCAP 152* 143* 130* 134* 107*   Lipid Profile: No results for input(s): "CHOL", "HDL", "LDLCALC", "TRIG", "CHOLHDL", "LDLDIRECT" in the last 72 hours. Thyroid Function Tests: No results for input(s): "TSH", "T4TOTAL", "FREET4", "T3FREE", "THYROIDAB" in the last 72 hours. Anemia Panel: No results for input(s): "VITAMINB12", "FOLATE", "FERRITIN", "TIBC", "IRON", "RETICCTPCT" in the last 72 hours. Sepsis Labs: No results for input(s): "PROCALCITON", "LATICACIDVEN" in the last 168 hours.  Recent Results (from the past 240 hour(s))  Respiratory (~20 pathogens) panel by PCR     Status: None   Collection Time: 02/16/22  8:36 AM   Specimen: Nasopharyngeal Swab; Respiratory  Result Value Ref Range Status   Adenovirus NOT DETECTED NOT DETECTED Final   Coronavirus 229E NOT DETECTED NOT DETECTED Final    Comment: (NOTE) The Coronavirus on the Respiratory Panel, DOES NOT test for the novel  Coronavirus (2019 nCoV)    Coronavirus HKU1 NOT DETECTED NOT DETECTED Final   Coronavirus NL63 NOT DETECTED NOT DETECTED Final   Coronavirus OC43 NOT DETECTED NOT DETECTED Final   Metapneumovirus NOT DETECTED NOT DETECTED Final   Rhinovirus / Enterovirus NOT DETECTED NOT DETECTED Final   Influenza A NOT DETECTED NOT DETECTED Final   Influenza B  NOT DETECTED NOT DETECTED Final   Parainfluenza Virus 1 NOT DETECTED NOT DETECTED Final   Parainfluenza Virus 2 NOT DETECTED NOT DETECTED Final   Parainfluenza Virus 3 NOT DETECTED NOT DETECTED Final   Parainfluenza Virus 4 NOT DETECTED NOT DETECTED Final   Respiratory Syncytial Virus NOT DETECTED NOT DETECTED Final   Bordetella pertussis NOT DETECTED NOT DETECTED Final   Bordetella Parapertussis NOT DETECTED NOT DETECTED Final   Chlamydophila pneumoniae NOT DETECTED NOT DETECTED Final   Mycoplasma pneumoniae NOT DETECTED NOT DETECTED Final    Comment: Performed at [facility], [place]. [address], [place], [place] [address]  Resp panel by RT-PCR (RSV, Flu A&B, Covid)  Anterior Nasal Swab     Status: None   Collection Time: 02/16/22  8:36 AM   Specimen: Anterior Nasal Swab  Result Value Ref Range Status   SARS Coronavirus 2 by RT PCR NEGATIVE NEGATIVE Final    Comment: (NOTE) SARS-CoV-2 target nucleic acids are NOT DETECTED.  The SARS-CoV-2 RNA is generally detectable in upper respiratory specimens during the acute phase of infection. The lowest concentration of SARS-CoV-2 viral copies this assay can detect is 138 copies/mL. A negative result does not preclude SARS-Cov-2 infection and should not be used as the sole basis for treatment or other patient management decisions. A negative result may occur with  improper specimen collection/handling, submission of specimen other than nasopharyngeal swab, presence of viral mutation(s) within the areas targeted by this assay, and inadequate number of viral copies(<138 copies/mL). A negative result must be combined with clinical observations, patient history, and epidemiological information. The expected result is Negative.  Fact Sheet for Patients:  EntrepreneurPulse.com.au  Fact Sheet for Healthcare Providers:  IncredibleEmployment.be  This test is no t yet approved or cleared by the [place]  FDA and  has been authorized for detection and/or diagnosis of SARS-CoV-2 by FDA under an Emergency Use Authorization (EUA). This EUA will remain  in effect (meaning this test can be used) for the duration of the COVID-19 declaration under Section 564(b)(1) of the Act, 21 U.S.C.section 360bbb-3(b)(1), unless the authorization is terminated  or revoked sooner.       Influenza A by PCR NEGATIVE NEGATIVE Final   Influenza B by PCR NEGATIVE NEGATIVE Final    Comment: (NOTE) The Xpert Xpress SARS-CoV-2/FLU/RSV plus assay is intended as an aid in the diagnosis of influenza from Nasopharyngeal swab specimens and should not be used as a sole basis for treatment. Nasal washings and aspirates are unacceptable for Xpert Xpress SARS-CoV-2/FLU/RSV testing.  Fact Sheet for Patients: EntrepreneurPulse.com.au  Fact Sheet for Healthcare Providers: IncredibleEmployment.be  This test is not yet approved or cleared by the [place] FDA and has been authorized for detection and/or diagnosis of SARS-CoV-2 by FDA under an Emergency Use Authorization (EUA). This EUA will remain in effect (meaning this test can be used) for the duration of the COVID-19 declaration under Section 564(b)(1) of the Act, 21 U.S.C. section 360bbb-3(b)(1), unless the authorization is terminated or revoked.     Resp Syncytial Virus by PCR NEGATIVE NEGATIVE Final    Comment: (NOTE) Fact Sheet for Patients: EntrepreneurPulse.com.au  Fact Sheet for Healthcare Providers: IncredibleEmployment.be  This test is not yet approved or cleared by the [place] FDA and has been authorized for detection and/or diagnosis of SARS-CoV-2 by FDA under an Emergency Use Authorization (EUA). This EUA will remain in effect (meaning this test can be used) for the duration of the COVID-19 declaration under Section 564(b)(1) of the Act, 21 U.S.C. section  360bbb-3(b)(1), unless the authorization is terminated or revoked.  Performed at [facility], [place] [address] [place], [place] [address]   Surgical pcr screen     Status: Abnormal   Collection Time: 02/18/22 12:13 PM   Specimen: Nasal Mucosa; Nasal Swab  Result Value Ref Range Status   MRSA, PCR NEGATIVE NEGATIVE Final   Staphylococcus aureus POSITIVE (A) NEGATIVE Final    Comment: (NOTE) The Xpert SA Assay (FDA approved for NASAL specimens in patients 45 years of age and older), is one component of a comprehensive surveillance program. It is not intended to diagnose infection nor to guide or monitor treatment.  Performed at [facility], [place] [address], [place], [place] [address]          Radiology Studies: DG Shoulder Right Port  Result Date: 02/18/2022 CLINICAL DATA:  Status post shoulder arthroplasty EXAM: RIGHT SHOULDER - 1 VIEW COMPARISON:  Right shoulder x-ray 02/15/2022 FINDINGS: There is a new right shoulder total arthroplasty in anatomic alignment. There is overlying soft tissue swelling and air compatible with recent surgery. No fracture. There are mild degenerative changes of the acromioclavicular joint. IMPRESSION: Right shoulder total arthroplasty in anatomic alignment. Electronically Signed   By: Ronney Asters M.D.   On: 02/18/2022 17:17        Scheduled Meds:  acetaminophen  1,000 mg Oral TID   Chlorhexidine Gluconate Cloth  6 each Topical Q0600   docusate sodium  100 mg Oral BID   guaiFENesin  600 mg Oral BID   insulin aspart  0-9 Units Subcutaneous TID WC   mupirocin ointment  1 Application Nasal BID   polyethylene glycol  17 g Oral BID   prochlorperazine  10 mg Intravenous Q6H   simvastatin  20 mg Oral q1800   sodium chloride flush  3 mL Intravenous Q12H   Continuous Infusions:   LOS: 5 days    Time spent: 45 minutes spent on chart review, discussion with nursing staff, consultants, updating family and interview/physical exam; more  than 50% of that time was spent in counseling and/or coordination of care.    Geradine Girt, DO [facility] Available via Epic secure chat 7am-7pm After these hours, please refer to coverage provider listed on [email or web address] 02/20/2022, 12:08 PM

## 2022-02-20 NOTE — Evaluation (Signed)
Physical Therapy Evaluation  Patient Details Name: Cindy Whitehead MRN: [id number] DOB: Dec 28, 1935 Today's Date: 02/20/2022  History of Present Illness  Pt is an 86 y.o. female with medical history significant of diabetes, asthma, hypertension, obesity, low back pain, adjustment disorder with anxiety and depression presenting after fall.  B humeral fx.  S/p R reverse TSA.  L UE non operative.   Clinical Impression  Pt admitted with above diagnosis. Pt currently with functional limitations due to the deficits listed below (see PT Problem List). At the time of PT eval pt was able to perform transfers and ambulation with up to +2 mod assist mainly for safety. Trialed the [facility] but pt may require increased stability from hemiwalker initially. Plan is for SNF level rehab with eventual return home with family support. Daughter present throughout session and very supportive. Acutely, pt will benefit from skilled PT to increase their independence and safety with mobility to allow discharge to the venue listed below.          Recommendations for follow up therapy are one component of a multi-disciplinary discharge planning process, led by the attending physician.  Recommendations may be updated based on patient status, additional functional criteria and insurance authorization.  Follow Up Recommendations Skilled nursing-short term rehab (<3 hours/day) Can patient physically be transported by private vehicle: Yes    Assistance Recommended at Discharge Frequent or constant Supervision/Assistance  Patient can return home with the following  A lot of help with walking and/or transfers;A lot of help with bathing/dressing/bathroom;Assistance with cooking/housework;Assist for transportation;Help with stairs or ramp for entrance    Equipment Recommendations Other (comment) (TBD by next venue of care)  Recommendations for Other Services       Functional Status Assessment Patient has had a recent decline in  their functional status and demonstrates the ability to make significant improvements in function in a reasonable and predictable amount of time.     Precautions / Restrictions Precautions Precautions: Fall;Shoulder Type of Shoulder Precautions: R reverse TSA Shoulder Interventions: Shoulder sling/immobilizer Precaution Booklet Issued: No Precaution Comments: Elbow distal ROM allowed.  R shoulder flexion to 90 degress only.  L sling off for ADL and HEP. Restrictions Weight Bearing Restrictions: Yes RUE Weight Bearing: Weight bearing as tolerated LUE Weight Bearing: Non weight bearing Other Position/Activity Restrictions: ? cane versus hemi walker      Mobility  Bed Mobility Overal bed mobility: Needs Assistance Bed Mobility: Supine to Sit     Supine to sit: Mod assist, HOB elevated     General bed mobility comments: Assist to elevate trunk to full sitting position. HOB elevated initially and pt was able to lean forward into long sitting and begin advancing LE's towards EOB.    Transfers Overall transfer level: Needs assistance Equipment used: 2 person hand held assist Transfers: Sit to/from Stand Sit to Stand: Mod assist, +2 safety/equipment           General transfer comment: VC's for sequencing and general safety. Pt able to bear some weight through HHA on the R but had difficulty maintaining NWB status on the L - attempting to reach out for something to hold to on the L.    Ambulation/Gait Ambulation/Gait assistance: Mod assist, +2 safety/equipment Gait Distance (Feet): 15 Feet (15'+10') Assistive device: 2 person hand held assist, Straight cane Gait Pattern/deviations: Step-to pattern, Decreased stride length, Antalgic, Trunk flexed Gait velocity: Decreased Gait velocity interpretation: <1.31 ft/sec, indicative of household ambulator   General Gait Details: Short, choppy steps  to advance to bathroom. SPC trialed on return from bathroom. Pt liked having something  to hold to but could benefit from something with increased stability.  Stairs            Wheelchair Mobility    Modified Rankin (Stroke Patients Only)       Balance Overall balance assessment: Needs assistance Sitting-balance support: Feet supported Sitting balance-Leahy Scale: Good     Standing balance support: No upper extremity supported Standing balance-Leahy Scale: Poor                               Pertinent Vitals/Pain Pain Assessment Pain Assessment: Faces Faces Pain Scale: Hurts little more Pain Location: R shoulder Pain Descriptors / Indicators: Sore Pain Intervention(s): Limited activity within patient's tolerance, Monitored during session, Repositioned    Home Living Family/patient expects to be discharged to:: Skilled nursing facility Living Arrangements: Alone                 Additional Comments: Plan will be for her to attend [facility] Rehab, then live with her daughter and SIL until she can transition back home    Prior Function Prior Level of Function : Independent/Modified Independent;Driving             Mobility Comments: No assist or AD needed ADLs Comments: Ind with ADL and iADL.  Continues to drive.     Hand Dominance   Dominant Hand: Right    Extremity/Trunk Assessment   Upper Extremity Assessment Upper Extremity Assessment: Defer to OT evaluation    Lower Extremity Assessment Lower Extremity Assessment: Generalized weakness    Cervical / Trunk Assessment Cervical / Trunk Assessment: Kyphotic  Communication   Communication: No difficulties  Cognition Arousal/Alertness: Awake/alert Behavior During Therapy: WFL for tasks assessed/performed Overall Cognitive Status: Within Functional Limits for tasks assessed                                          General Comments      Exercises     Assessment/Plan    PT Assessment Patient needs continued PT services  PT Problem List Decreased  strength;Decreased range of motion;Decreased activity tolerance;Decreased balance;Decreased mobility;Decreased knowledge of use of DME;Decreased safety awareness;Decreased knowledge of precautions;Pain       PT Treatment Interventions DME instruction;Gait training;Stair training;Functional mobility training;Therapeutic exercise;Therapeutic activities;Balance training;Patient/family education    PT Goals (Current goals can be found in the Care Plan section)  Acute Rehab PT Goals Patient Stated Goal: Eventually return back to her home and PLOF PT Goal Formulation: With patient/family Time For Goal Achievement: 02/27/22 Potential to Achieve Goals: Good    Frequency Min 3X/week     Co-evaluation               AM-PAC PT "6 Clicks" Mobility  Outcome Measure Help needed turning from your back to your side while in a flat bed without using bedrails?: A Lot Help needed moving from lying on your back to sitting on the side of a flat bed without using bedrails?: A Lot Help needed moving to and from a bed to a chair (including a wheelchair)?: A Lot Help needed standing up from a chair using your arms (e.g., wheelchair or bedside chair)?: A Lot Help needed to walk in hospital room?: A Lot Help needed climbing 3-5 steps with a  railing? : Total 6 Click Score: 11    End of Session Equipment Utilized During Treatment: Gait belt Activity Tolerance: Patient limited by fatigue Patient left: in chair;with call bell/phone within reach;with family/visitor present Nurse Communication: Mobility status PT Visit Diagnosis: Unsteadiness on feet (R26.81);Pain;Difficulty in walking, not elsewhere classified (R26.2) Pain - Right/Left: Right (Bilaterally) Pain - part of body: Shoulder;Arm    Time: 9872-1587 PT Time Calculation (min) (ACUTE ONLY): 44 min   Charges:   PT Evaluation $PT Eval Moderate Complexity: 1 Mod PT Treatments $Gait Training: 23-37 mins        Rolinda Roan, PT, DPT Acute  Rehabilitation Services Secure Chat Preferred Office: [phone]   Thelma Comp 02/20/2022, 1:39 PM

## 2022-02-20 NOTE — Plan of Care (Signed)
  Problem: Education: Goal: Knowledge of General Education information will improve Description: Including pain rating scale, medication(s)/side effects and non-pharmacologic comfort measures Outcome: Progressing   Problem: Clinical Measurements: Goal: Diagnostic test results will improve Outcome: Progressing   Problem: Activity: Goal: Risk for activity intolerance will decrease Outcome: Progressing   Problem: Nutrition: Goal: Adequate nutrition will be maintained Outcome: Progressing   Problem: Skin Integrity: Goal: Risk for impaired skin integrity will decrease Outcome: Progressing

## 2022-02-20 NOTE — Progress Notes (Signed)
Subjective: 2 Days Post-Op Procedure(s) (LRB): REVERSE SHOULDER ARTHROPLASTY (Right) Patient reports pain as mild.   Pt somnolent during my exam. Able to nod head yes. No c/o. Has not received narcotics since yesterday  Objective: Vital signs in last 24 hours: Temp:  [98 F (36.7 C)-98.5 F (36.9 C)] 98 F (36.7 C) (12/17 0913) Pulse Rate:  [60-79] 68 (12/17 0913) Resp:  [16-19] 18 (12/17 0913) BP: (116-151)/(59-76) 140/62 (12/17 0913) SpO2:  [93 %-98 %] 93 % (12/17 0913)  Intake/Output from previous day: 12/16 0701 - 12/17 0700 In: 240 [P.O.:240] Out: 650 [Urine:650] Intake/Output this shift: Total I/O In: 240 [P.O.:240] Out: -   Recent Labs    02/18/22 0339 02/19/22 0317 02/20/22 0253  HGB 10.2* 10.1* 9.7*   Recent Labs    02/19/22 0317 02/20/22 0253  WBC 8.6 11.4*  RBC 3.06* 2.94*  HCT 29.3* 28.1*  PLT 167 182   Recent Labs    02/19/22 0317 02/20/22 0253  NA 137 138  K 4.5 4.3  CL 104 104  CO2 25 27  BUN 19 22  CREATININE 0.89 0.78  GLUCOSE 164* 125*  CALCIUM 8.4* 8.3*   No results for input(s): "LABPT", "INR" in the last 72 hours.  Neurologically intact ABD soft Neurovascular intact Sensation intact distally Intact pulses distally Dorsiflexion/Plantar flexion intact Incision: dressing C/D/I and no drainage No cellulitis present Compartment soft No sign of DVT   Assessment/Plan: 2 Days Post-Op Procedure(s) (LRB): REVERSE SHOULDER ARTHROPLASTY (Right) Advance diet Up with therapy D/C IV fluids Awaiting SNF Limit narcotics   Cindy Whitehead 02/20/2022, 12:20 PM

## 2022-02-21 ENCOUNTER — Encounter (HOSPITAL_COMMUNITY): Payer: Self-pay | Admitting: Orthopedic Surgery

## 2022-02-21 DIAGNOSIS — S42291A Other displaced fracture of upper end of right humerus, initial encounter for closed fracture: Secondary | ICD-10-CM | POA: Diagnosis not present

## 2022-02-21 DIAGNOSIS — W19XXXA Unspecified fall, initial encounter: Secondary | ICD-10-CM | POA: Diagnosis not present

## 2022-02-21 DIAGNOSIS — S42222A 2-part displaced fracture of surgical neck of left humerus, initial encounter for closed fracture: Secondary | ICD-10-CM | POA: Diagnosis not present

## 2022-02-21 LAB — GLUCOSE, CAPILLARY
Glucose-Capillary: 142 mg/dL — ABNORMAL HIGH (ref 70–99)
Glucose-Capillary: 103 mg/dL — ABNORMAL HIGH (ref 70–99)
Glucose-Capillary: 121 mg/dL — ABNORMAL HIGH (ref 70–99)
Glucose-Capillary: 103 mg/dL — ABNORMAL HIGH (ref 70–99)

## 2022-02-21 NOTE — Progress Notes (Signed)
Subjective:  Cindy Whitehead is a 86 y.o. female, 3 Days Post-Op   s/p Procedure(s): REVERSE SHOULDER ARTHROPLASTY   Patient reports pain as mild to moderate.  Reports most of her pain is on the right shoulder compared to the left.  Denies numbness or tingling bilaterally.  Denies fever or chills.  Has been able to go to the restroom without difficulty.  Patient's daughter present, reports that she had not seen PT or OT today.  Objective:   VITALS:   Vitals:   02/20/22 1737 02/20/22 2042 02/21/22 0756 02/21/22 1439  BP: (!) 143/71 129/60 (!) 139/54 (!) 139/56  Pulse: 79 95 75 73  Resp: 18 16    Temp: 98 F (36.7 C) 98.2 F (36.8 C) 97.7 F (36.5 C) 98.5 F (36.9 C)  TempSrc:   Oral Oral  SpO2: 94% 93% 96% 97%  Weight:      Height:       General :  Laying in hospital bed, no acute distress,  Right upper extremity: Aquacel dressing clean, dry, intact.  Full elbow range of motion, full wrist and digit range of motion.  Mild swelling of the right arm.  Resolving ecchymosis of the right arm.  Neurovascularly intact.  Attempted gentle passive motion with the patient, limited passive shoulder motion secondary to pain,  Left upper extremity: Resolving ecchymosis of the left arm, full elbow range of motion, full wrist and digit range of motion.  Neurovascular intact distally.  Lab Results  Component Value Date   WBC 11.4 (H) 02/20/2022   HGB 9.7 (L) 02/20/2022   HCT 28.1 (L) 02/20/2022   MCV 95.6 02/20/2022   PLT 182 02/20/2022   BMET    Component Value Date/Time   NA 138 02/20/2022 0253   K 4.3 02/20/2022 0253   CL 104 02/20/2022 0253   CO2 27 02/20/2022 0253   GLUCOSE 125 (H) 02/20/2022 0253   BUN 22 02/20/2022 0253   CREATININE 0.78 02/20/2022 0253   CALCIUM 8.3 (L) 02/20/2022 0253   GFRNONAA >60 02/20/2022 0253     Assessment/Plan: 3 Days Post-Op   Principal Problem:   Closed fracture of right proximal humerus Active Problems:   Diabetes type 2, controlled  ([place])   Hyperlipidemia associated with type 2 diabetes mellitus (HCC)   Class 2 obesity due to excess calories with body mass index (BMI) of 38.0 to 38.9 in adult   Essential hypertension, benign   Asthma   GERD (gastroesophageal reflux disease)   Closed fracture of left proximal humerus   Fall at home, initial encounter   Advance diet Up with therapy  Status post right reverse total shoulder arthroplasty: Okay for use for ADLs, all elbow, hand, wrist range of motion, no lifting more than 2 pounds with the exception of weightbearing on the right arm for use with a walker.  Sling for comfort.  Left proximal humerus fracture: Continue conservative treatment in the sling, come out of the sling for elbow, wrist, digit range of motion, or when resting in the bed or chair  can up the arm on a pillow .nonweightbearing. Will progress to more formal shoulder therapy closer to 2-3 weeks after injury.   Weightbearing Status: less than 2 pounds right arm, NWB left arm DVT Prophylaxis: SCDs  Dispo: SNF, cleared from orthopedics standpoint.   Follow-up at [facility] in 2 weeks for x-rays, wound check   Faythe Casa 02/21/2022, 4:42 PM  Jonelle Sidle PA-C  Physician Assistant with Dr. Stann Mainland,  [facility]

## 2022-02-21 NOTE — TOC Initial Note (Addendum)
Transition of Care [facility]) - Initial/Assessment Note    Patient Details  Name: Cindy Whitehead MRN: [id number] Date of Birth: [birth year]  Transition [facility]) CM/SW Contact:    Milas Gain, [place] Phone Number: 02/21/2022, 5:36 PM  Clinical Narrative:                  CSW received consult for possible SNF placement at time of discharge. CSW spoke with patient and patients daughter Arrie Aran regarding PT recommendation of SNF placement for patient at time of discharge. PTA patient reports she comes from home alone. Patient expressed understanding of PT recommendation and is agreeable to SNF placement at time of discharge. Patient reports preference for [facility] .Patient gave CSW permission to fax out initial referral near the [facility] area for possible SNF placement. CSW discussed insurance authorization process with patient and patients daughter. No further questions reported at this time. CSW to continue to follow and assist with discharge planning needs.   Update-CSW started insurance authorization for patient. [place] ID# [id number]. CSW will add SNF choice to patients auth once confirmed by patient.   Expected Discharge Plan: Skilled Nursing Facility Barriers to Discharge: Continued Medical Work up   Patient Goals and CMS Choice Patient states their goals for this hospitalization and ongoing recovery are:: SNF CMS Medicare.gov Compare Post Acute Care list provided to:: Patient Choice offered to / list presented to : Patient  Expected Discharge Plan and Services Expected Discharge Plan: [place] In-house Referral: Clinical Social Work     Living arrangements for the past 2 months: [place]                                      Prior Living Arrangements/Services Living arrangements for the past 2 months: Single Family Home Lives with:: Self Patient language and need for interpreter reviewed:: Yes Do you feel safe going back to the  place where you live?: No   SNF  Need for Family Participation in Patient Care: Yes (Comment) Care giver support system in place?: Yes (comment)   Criminal Activity/Legal Involvement Pertinent to Current Situation/Hospitalization: No - Comment as needed  Activities of Daily Living Home Assistive Devices/Equipment: None ADL Screening (condition at time of admission) Patient's cognitive ability adequate to safely complete daily activities?: Yes Is the patient deaf or have difficulty hearing?: No Does the patient have difficulty seeing, even when wearing glasses/contacts?: No Does the patient have difficulty concentrating, remembering, or making decisions?: No Patient able to express need for assistance with ADLs?: Yes Does the patient have difficulty dressing or bathing?: No Independently performs ADLs?: Yes (appropriate for developmental age) Does the patient have difficulty walking or climbing stairs?: No Weakness of Legs: None Weakness of Arms/Hands: Both  Permission Sought/Granted Permission sought to share information with : Case Manager, Family Supports, Chartered certified accountant granted to share information with : Yes, Verbal Permission Granted  Share Information with NAME: Dawn  Permission granted to share info w AGENCY: SNF  Permission granted to share info w Relationship: daughter  Permission granted to share info w Contact Information: Arrie Aran [phone]  Emotional Assessment Appearance:: Appears stated age Attitude/Demeanor/Rapport: Gracious Affect (typically observed): Calm Orientation: : Oriented to Self, Oriented to Place, Oriented to  Time, Oriented to Situation Alcohol / Substance Use: Not Applicable Psych Involvement: No (comment)  Admission diagnosis:  Humerus fracture [S42.309A] Closed 2-part displaced fracture  of surgical neck of left humerus, initial encounter [S42.222A] Fall, initial encounter [W19.XXXA] Closed 2-part displaced fracture of  surgical neck of right humerus, initial encounter [S42.221A] Other closed displaced fracture of proximal end of right humerus, initial encounter [S42.291A] Patient Active Problem List   Diagnosis Date Noted   Closed fracture of right proximal humerus 02/15/2022   Closed fracture of left proximal humerus 02/15/2022   Fall at home, initial encounter 02/15/2022   Hand paresthesia 08/28/2019   Tinnitus 12/26/2016   Urge incontinence 11/24/2015   Routine general medical examination at a health care facility 05/20/2015   Family history of colon cancer 05/20/2015   Chronic midline low back pain without sciatica 05/20/2015   Low back pain radiating to right leg 09/02/2014   Encounter for Medicare annual wellness exam 05/13/2014   Adjustment disorder with mixed anxiety and depressed mood 10/29/2013   GERD (gastroesophageal reflux disease) 10/13/2010   Vitamin D deficiency 04/14/2010   Osteopenia 05/26/2008   Essential hypertension, benign 05/21/2008   Class 2 obesity due to excess calories with body mass index (BMI) of 38.0 to 38.9 in adult 08/22/2007   REACTION, ACUTE STRESS W/EMOTIONAL DSTURB 09/14/2006   Diabetes type 2, controlled ([place]) 05/11/2006   Hyperlipidemia associated with type 2 diabetes mellitus ([place]) 05/11/2006   ALLERGIC RHINITIS 05/11/2006   Asthma 05/11/2006   PCP:  Abner Greenspan, MD Pharmacy:   [place], [place] [address] Phone: [phone] Fax: [phone]  [id number] [place] [address] [address] [place]Phone: [phone]: [phone]    Social Determinants of Health (SDOH) Interventions    Readmission Risk Interventions     No data to display

## 2022-02-21 NOTE — Plan of Care (Signed)
  Problem: Clinical Measurements: Goal: Diagnostic test results will improve Outcome: Progressing   Problem: Nutrition: Goal: Adequate nutrition will be maintained Outcome: Progressing   Problem: Pain Managment: Goal: General experience of comfort will improve Outcome: Progressing   Problem: Activity: Goal: Ability to tolerate increased activity will improve Outcome: Progressing

## 2022-02-21 NOTE — Progress Notes (Signed)
PROGRESS NOTE    Cindy Whitehead  OYD:[id number] DOB: May 29, 1935 DOA: 02/15/2022 PCP: Abner Greenspan, MD    Brief Narrative:  Cindy Whitehead is a 86 y.o. female from home with medical history significant of hyperlipidemia, diabetes, GERD, asthma, hypertension, obesity, low back pain, adjustment disorder with anxiety and depression presenting after fall.   Patient states that she was walking to take out her trash and tripped and fell.  She put both her arms out to break her fall and after landing on her arm she has pain in both arms right greater than left.  She additionally reports some tingling in her right hand.  She states that she did not hit her head nor lose consciousness.   S/p repair on right arm.  Will need SNF   Assessment and Plan: Right humerus fracture/Left humerus fracture s/p Fall > Patient presenting after mechanical fall while taking trash out.  She put both her arms out to catch herself and after catching herself she has had pain in both arms right greater than left. Imaging in ED showed bilateral humerus fractures.   -Patient evaluated by orthopedics or getting> CT scans of both shoulders plan for reverse total shoulder on the right on Friday and possible nonoperative management on the left pending CT results.-- await ortho consult - schedule tylenol, PRN pain meds cautiously -PT/OT- SNF   URI- resolved -xray without pna -NP swabs negative -pulm toilet- needs incentive spirometry (I brought to her myself)  Hyperlipidemia - Continue home simvastatin   Diabetes - SSI   Hypertension GERD Asthma  - Listed in chart but not currently on any medications  -PRN nebs   Obesity Estimated body mass index is 34.18 kg/m as calculated from the following:   Height as of this encounter: 5' (1.524 m).   Weight as of this encounter: 79.4 kg.    DVT prophylaxis: SCD's Start: 02/18/22 1746 SCDs Start: 02/15/22 1440    Code Status: Full Code Family Communication: called  daughter  Disposition Plan:  Level of care: Med-Surg Status is: Inpatient Remains inpatient appropriate because:needs SNF    Consultants:  ortho   Subjective:  Did not sleep well  Objective: Vitals:   02/20/22 0913 02/20/22 1737 02/20/22 2042 02/21/22 0756  BP: (!) 140/62 (!) 143/71 129/60 (!) 139/54  Pulse: 68 79 95 75  Resp: '18 18 16   '$ Temp: 98 F (36.7 C) 98 F (36.7 C) 98.2 F (36.8 C) 97.7 F (36.5 C)  TempSrc:    Oral  SpO2: 93% 94% 93% 96%  Weight:      Height:        Intake/Output Summary (Last 24 hours) at 02/21/2022 1228 Last data filed at 02/21/2022 0820 Gross per 24 hour  Intake 390 ml  Output 400 ml  Net -10 ml   Filed Weights   02/15/22 0938 02/18/22 1334  Weight: 79.4 kg 79.4 kg    Examination:    General: Appearance:    Obese female in no acute distress     Lungs:     respirations unlabored  Heart:    Normal heart rate.   MS:   All extremities are intact.   Neurologic:   Awake, alert      Data Reviewed: I have personally reviewed following labs and imaging studies  CBC: Recent Labs  Lab 02/15/22 1637 02/16/22 0354 02/18/22 0339 02/19/22 0317 02/20/22 0253  WBC 13.6* 12.6* 10.1 8.6 11.4*  NEUTROABS 12.5*  --   --   --   --  HGB 12.5 11.6* 10.2* 10.1* 9.7*  HCT 38.1 32.9* 30.8* 29.3* 28.1*  MCV 98.7 92.4 97.2 95.8 95.6  PLT 159 183 165 167 778   Basic Metabolic Panel: Recent Labs  Lab 02/15/22 1637 02/16/22 0354 02/18/22 0339 02/19/22 0317 02/20/22 0253  NA 138 137 135 137 138  K 4.1 4.3 4.3 4.5 4.3  CL 106 105 103 104 104  CO2 17* '22 24 25 27  '$ GLUCOSE 167* 143* 157* 164* 125*  BUN 17 25* '21 19 22  '$ CREATININE 0.78 0.92 0.84 0.89 0.78  CALCIUM 8.8* 8.8* 8.5* 8.4* 8.3*   GFR: Estimated Creatinine Clearance: 48.8 mL/min (by C-G formula based on SCr of 0.78 mg/dL). Liver Function Tests: Recent Labs  Lab 02/15/22 1637 02/16/22 0354  AST 32 26  ALT 18 17  ALKPHOS 63 55  BILITOT 0.8 1.0  PROT 6.6 6.1*   ALBUMIN 3.5 3.2*   No results for input(s): "LIPASE", "AMYLASE" in the last 168 hours. No results for input(s): "AMMONIA" in the last 168 hours. Coagulation Profile: Recent Labs  Lab 02/15/22 1637  INR 1.2   Cardiac Enzymes: No results for input(s): "CKTOTAL", "CKMB", "CKMBINDEX", "TROPONINI" in the last 168 hours. BNP (last 3 results) No results for input(s): "PROBNP" in the last 8760 hours. HbA1C: No results for input(s): "HGBA1C" in the last 72 hours. CBG: Recent Labs  Lab 02/20/22 1223 02/20/22 1606 02/20/22 2043 02/21/22 0758 02/21/22 1130  GLUCAP 147* 116* 131* 142* 103*   Lipid Profile: No results for input(s): "CHOL", "HDL", "LDLCALC", "TRIG", "CHOLHDL", "LDLDIRECT" in the last 72 hours. Thyroid Function Tests: No results for input(s): "TSH", "T4TOTAL", "FREET4", "T3FREE", "THYROIDAB" in the last 72 hours. Anemia Panel: No results for input(s): "VITAMINB12", "FOLATE", "FERRITIN", "TIBC", "IRON", "RETICCTPCT" in the last 72 hours. Sepsis Labs: No results for input(s): "PROCALCITON", "LATICACIDVEN" in the last 168 hours.  Recent Results (from the past 240 hour(s))  Respiratory (~20 pathogens) panel by PCR     Status: None   Collection Time: 02/16/22  8:36 AM   Specimen: Nasopharyngeal Swab; Respiratory  Result Value Ref Range Status   Adenovirus NOT DETECTED NOT DETECTED Final   Coronavirus 229E NOT DETECTED NOT DETECTED Final    Comment: (NOTE) The Coronavirus on the Respiratory Panel, DOES NOT test for the novel  Coronavirus (2019 nCoV)    Coronavirus HKU1 NOT DETECTED NOT DETECTED Final   Coronavirus NL63 NOT DETECTED NOT DETECTED Final   Coronavirus OC43 NOT DETECTED NOT DETECTED Final   Metapneumovirus NOT DETECTED NOT DETECTED Final   Rhinovirus / Enterovirus NOT DETECTED NOT DETECTED Final   Influenza A NOT DETECTED NOT DETECTED Final   Influenza B NOT DETECTED NOT DETECTED Final   Parainfluenza Virus 1 NOT DETECTED NOT DETECTED Final    Parainfluenza Virus 2 NOT DETECTED NOT DETECTED Final   Parainfluenza Virus 3 NOT DETECTED NOT DETECTED Final   Parainfluenza Virus 4 NOT DETECTED NOT DETECTED Final   Respiratory Syncytial Virus NOT DETECTED NOT DETECTED Final   Bordetella pertussis NOT DETECTED NOT DETECTED Final   Bordetella Parapertussis NOT DETECTED NOT DETECTED Final   Chlamydophila pneumoniae NOT DETECTED NOT DETECTED Final   Mycoplasma pneumoniae NOT DETECTED NOT DETECTED Final    Comment: Performed at [facility], [place]. [address] [place], [place] [address]  Resp panel by RT-PCR (RSV, Flu A&B, Covid) Anterior Nasal Swab     Status: None   Collection Time: 02/16/22  8:36 AM   Specimen: Anterior Nasal Swab  Result  Value Ref Range Status   SARS Coronavirus 2 by RT PCR NEGATIVE NEGATIVE Final    Comment: (NOTE) SARS-CoV-2 target nucleic acids are NOT DETECTED.  The SARS-CoV-2 RNA is generally detectable in upper respiratory specimens during the acute phase of infection. The lowest concentration of SARS-CoV-2 viral copies this assay can detect is 138 copies/mL. A negative result does not preclude SARS-Cov-2 infection and should not be used as the sole basis for treatment or other patient management decisions. A negative result may occur with  improper specimen collection/handling, submission of specimen other than nasopharyngeal swab, presence of viral mutation(s) within the areas targeted by this assay, and inadequate number of viral copies(<138 copies/mL). A negative result must be combined with clinical observations, patient history, and epidemiological information. The expected result is Negative.  Fact Sheet for Patients:  EntrepreneurPulse.com.au  Fact Sheet for Healthcare Providers:  IncredibleEmployment.be  This test is no t yet approved or cleared by the [place] FDA and  has been authorized for detection and/or diagnosis of SARS-CoV-2 by FDA under an  Emergency Use Authorization (EUA). This EUA will remain  in effect (meaning this test can be used) for the duration of the COVID-19 declaration under Section 564(b)(1) of the Act, 21 U.S.C.section 360bbb-3(b)(1), unless the authorization is terminated  or revoked sooner.       Influenza A by PCR NEGATIVE NEGATIVE Final   Influenza B by PCR NEGATIVE NEGATIVE Final    Comment: (NOTE) The Xpert Xpress SARS-CoV-2/FLU/RSV plus assay is intended as an aid in the diagnosis of influenza from Nasopharyngeal swab specimens and should not be used as a sole basis for treatment. Nasal washings and aspirates are unacceptable for Xpert Xpress SARS-CoV-2/FLU/RSV testing.  Fact Sheet for Patients: EntrepreneurPulse.com.au  Fact Sheet for Healthcare Providers: IncredibleEmployment.be  This test is not yet approved or cleared by the [place] FDA and has been authorized for detection and/or diagnosis of SARS-CoV-2 by FDA under an Emergency Use Authorization (EUA). This EUA will remain in effect (meaning this test can be used) for the duration of the COVID-19 declaration under Section 564(b)(1) of the Act, 21 U.S.C. section 360bbb-3(b)(1), unless the authorization is terminated or revoked.     Resp Syncytial Virus by PCR NEGATIVE NEGATIVE Final    Comment: (NOTE) Fact Sheet for Patients: EntrepreneurPulse.com.au  Fact Sheet for Healthcare Providers: IncredibleEmployment.be  This test is not yet approved or cleared by the [place] FDA and has been authorized for detection and/or diagnosis of SARS-CoV-2 by FDA under an Emergency Use Authorization (EUA). This EUA will remain in effect (meaning this test can be used) for the duration of the COVID-19 declaration under Section 564(b)(1) of the Act, 21 U.S.C. section 360bbb-3(b)(1), unless the authorization is terminated or revoked.  Performed at [facility], [place] [address] [place], [place] [address]   Surgical pcr screen     Status: Abnormal   Collection Time: 02/18/22 12:13 PM   Specimen: Nasal Mucosa; Nasal Swab  Result Value Ref Range Status   MRSA, PCR NEGATIVE NEGATIVE Final   Staphylococcus aureus POSITIVE (A) NEGATIVE Final    Comment: (NOTE) The Xpert SA Assay (FDA approved for NASAL specimens in patients 55 years of age and older), is one component of a comprehensive surveillance program. It is not intended to diagnose infection nor to guide or monitor treatment. Performed at [facility], [place] [address] [place], [place] [address]          Radiology Studies: No  results found.      Scheduled Meds:  acetaminophen  1,000 mg Oral TID   Chlorhexidine Gluconate Cloth  6 each Topical Q0600   docusate sodium  100 mg Oral BID   guaiFENesin  600 mg Oral BID   insulin aspart  0-9 Units Subcutaneous TID WC   mupirocin ointment  1 Application Nasal BID   polyethylene glycol  17 g Oral BID   prochlorperazine  10 mg Intravenous Q6H   simvastatin  20 mg Oral q1800   sodium chloride flush  3 mL Intravenous Q12H   Continuous Infusions:   LOS: 6 days    Time spent: 45 minutes spent on chart review, discussion with nursing staff, consultants, updating family and interview/physical exam; more than 50% of that time was spent in counseling and/or coordination of care.    Geradine Girt, DO [facility] Available via Epic secure chat 7am-7pm After these hours, please refer to coverage provider listed on [email or web address] 02/21/2022, 12:28 PM

## 2022-02-21 NOTE — NC FL2 (Signed)
[place] LEVEL OF CARE FORM     IDENTIFICATION  Patient Name: Cindy Whitehead Birthdate: [birth year] Sex: female Admission Date (Current Location): 02/15/2022  [facility] and [place] Number:  Herbalist and Address:  [facility] [place], [place] [address] [place], [address] [address]      Provider Number: [id number]  Attending Physician Name and Address:  Geradine Girt, DO  Relative Name and Phone Number:  Arrie Aran (daughter) [phone]    Current Level of Care: Hospital Recommended Level of Care: [place] Prior Approval Number:    Date Approved/Denied:   PASRR Number: [id number]  Discharge Plan: SNF    Current Diagnoses: Patient Active Problem List   Diagnosis Date Noted   Closed fracture of right proximal humerus 02/15/2022   Closed fracture of left proximal humerus 02/15/2022   Fall at home, initial encounter 02/15/2022   Hand paresthesia 08/28/2019   Tinnitus 12/26/2016   Urge incontinence 11/24/2015   Routine general medical examination at a health care facility 05/20/2015   Family history of colon cancer 05/20/2015   Chronic midline low back pain without sciatica 05/20/2015   Low back pain radiating to right leg 09/02/2014   Encounter for Medicare annual wellness exam 05/13/2014   Adjustment disorder with mixed anxiety and depressed mood 10/29/2013   GERD (gastroesophageal reflux disease) 10/13/2010   Vitamin D deficiency 04/14/2010   Osteopenia 05/26/2008   Essential hypertension, benign 05/21/2008   Class 2 obesity due to excess calories with body mass index (BMI) of 38.0 to 38.9 in adult 08/22/2007   REACTION, ACUTE STRESS W/EMOTIONAL DSTURB 09/14/2006   Diabetes type 2, controlled ([place]) 05/11/2006   Hyperlipidemia associated with type 2 diabetes mellitus ([place]) 05/11/2006   ALLERGIC RHINITIS 05/11/2006   Asthma 05/11/2006    Orientation RESPIRATION BLADDER Height & Weight     Self, Time, Situation,  Place  Normal Incontinent, External catheter (External Urinary Catheter) Weight: 175 lb (79.4 kg) Height:  5' (152.4 cm)  BEHAVIORAL SYMPTOMS/MOOD NEUROLOGICAL BOWEL NUTRITION STATUS       (WDL) Diet (Please see discharge summary)  AMBULATORY STATUS COMMUNICATION OF NEEDS Skin   Extensive Assist Verbally Other (Comment) (Ecchymosis,arm,bilateral,Erythema,arm bilateral,Wound/Incision LDAas,Incision closed,shoulder,R,silicone dressing,PRN,Incision closed shoulder L)                       Personal Care Assistance Level of Assistance  Bathing, Feeding, Dressing Bathing Assistance: Maximum assistance Feeding assistance: Maximum assistance Dressing Assistance: Maximum assistance     Functional Limitations Info  Sight, Hearing, Speech Sight Info: Impaired Hearing Info: Adequate Speech Info: Adequate    SPECIAL CARE FACTORS FREQUENCY  PT (By licensed PT), OT (By licensed OT)     PT Frequency: 5x min weekly OT Frequency: 5x min weekly            Contractures Contractures Info: Not present    Additional Factors Info  Code Status, Allergies, Insulin Sliding Scale Code Status Info: FULL Allergies Info: Atorvastatin,Ace Inhibitors   Insulin Sliding Scale Info: insulin aspart (novoLOG) injection 0-9 Units 3 times daily with meals       Current Medications (02/21/2022):  This is the current hospital active medication list Current Facility-Administered Medications  Medication Dose Route Frequency Provider Last Rate Last Admin   acetaminophen (TYLENOL) tablet 1,000 mg  1,000 mg Oral TID Vann, Jessica U, DO   1,000 mg at 02/21/22 1733   Chlorhexidine Gluconate Cloth 2 % PADS 6 each  6  each Topical Q0600 Geradine Girt, DO   6 each at 02/21/22 0414   docusate sodium (COLACE) capsule 100 mg  100 mg Oral BID Nicholes Stairs, MD   100 mg at 02/21/22 1100   guaiFENesin ([place]) 12 hr tablet 600 mg  600 mg Oral BID Nicholes Stairs, MD   600 mg at 02/21/22 1100   insulin  aspart (novoLOG) injection 0-9 Units  0-9 Units Subcutaneous TID WC Nicholes Stairs, MD   1 Units at 02/21/22 1733   menthol-cetylpyridinium (CEPACOL) lozenge 3 mg  1 lozenge Oral PRN Nicholes Stairs, MD       Or   phenol [facility]) mouth spray 1 spray  1 spray Mouth/Throat PRN Nicholes Stairs, MD       metoCLOPramide (REGLAN) injection 10 mg  10 mg Intravenous Q6H PRN Nicholes Stairs, MD       mupirocin ointment (BACTROBAN) 2 % 1 Application  1 Application Nasal BID Eulogio Bear U, DO   1 Application at 05/69/79 1100   ondansetron (ZOFRAN) tablet 4 mg  4 mg Oral Q6H PRN Nicholes Stairs, MD       Or   ondansetron [facility]) injection 4 mg  4 mg Intravenous Q6H PRN Nicholes Stairs, MD       polyethylene glycol (MIRALAX / GLYCOLAX) packet 17 g  17 g Oral BID Nicholes Stairs, MD   17 g at 02/21/22 1100   prochlorperazine (COMPAZINE) injection 10 mg  10 mg Intravenous Q6H Nicholes Stairs, MD   10 mg at 02/21/22 1401   simvastatin (ZOCOR) tablet 20 mg  20 mg Oral q1800 Nicholes Stairs, MD   20 mg at 02/21/22 1733   sodium chloride flush (NS) 0.9 % injection 10-40 mL  10-40 mL Intracatheter PRN Eulogio Bear U, DO       sodium chloride flush (NS) 0.9 % injection 3 mL  3 mL Intravenous Q12H Nicholes Stairs, MD   3 mL at 02/21/22 1100   traMADol (ULTRAM) tablet 50 mg  50 mg Oral Q6H PRN Eulogio Bear U, DO   50 mg at 02/21/22 1411     Discharge Medications: Please see discharge summary for a list of discharge medications.  Relevant Imaging Results:  Relevant Lab Results:   Additional Information SSN- [id number]  Milas Gain, LCSWA

## 2022-02-22 DIAGNOSIS — S42222A 2-part displaced fracture of surgical neck of left humerus, initial encounter for closed fracture: Secondary | ICD-10-CM | POA: Diagnosis not present

## 2022-02-22 LAB — BASIC METABOLIC PANEL
Anion gap: 5 (ref 5–15)
BUN: 16 mg/dL (ref 8–23)
CO2: 25 mmol/L (ref 22–32)
Calcium: 8.1 mg/dL — ABNORMAL LOW (ref 8.9–10.3)
Chloride: 108 mmol/L (ref 98–111)
Creatinine, Ser: 0.78 mg/dL (ref 0.44–1.00)
GFR, Estimated: >60 mL/min (ref >60)
Glucose, Bld: 113 mg/dL — ABNORMAL HIGH (ref 70–99)
Potassium: 4.1 mmol/L (ref 3.5–5.1)
Sodium: 138 mmol/L (ref 135–145)

## 2022-02-22 LAB — CBC
HCT: 28.1 % — ABNORMAL LOW (ref 36.0–46.0)
Hemoglobin: 9.5 g/dL — ABNORMAL LOW (ref 12.0–15.0)
MCH: 32.8 pg (ref 26.0–34.0)
MCHC: 33.8 g/dL (ref 30.0–36.0)
MCV: 96.9 fL (ref 80.0–100.0)
Platelets: 200 10*3/uL (ref 150–400)
RBC: 2.9 MIL/uL — ABNORMAL LOW (ref 3.87–5.11)
RDW: 13.2 % (ref 11.5–15.5)
WBC: 8.4 10*3/uL (ref 4.0–10.5)
nRBC: 0.2 % (ref 0.0–0.2)

## 2022-02-22 LAB — GLUCOSE, CAPILLARY
Glucose-Capillary: 133 mg/dL — ABNORMAL HIGH (ref 70–99)
Glucose-Capillary: 132 mg/dL — ABNORMAL HIGH (ref 70–99)
Glucose-Capillary: 142 mg/dL — ABNORMAL HIGH (ref 70–99)

## 2022-02-22 MED ORDER — OXYBUTYNIN CHLORIDE ER 10 MG PO TB24
10.0000 mg | ORAL_TABLET | Freq: Every day | ORAL | Status: DC
  Administered 2022-02-22: 10 mg via ORAL
  Filled 2022-02-22 (×2): qty 1

## 2022-02-22 MED ORDER — ACETAMINOPHEN 325 MG PO TABS
650.0000 mg | ORAL_TABLET | Freq: Four times a day (QID) | ORAL | Status: DC | PRN
  Administered 2022-02-22: 650 mg via ORAL
  Filled 2022-02-22: qty 2

## 2022-02-22 NOTE — TOC Progression Note (Addendum)
Transition of Care [facility]) - Progression Note    Patient Details  Name: SHAKEYLA GIEBLER MRN: [id number] Date of Birth: May 08, 1935  [facility] CM/SW Contact  Joanne Chars, LCSW Phone Number: 02/22/2022, 10:58 AM  Clinical Narrative:   CSW received call from [employer]--need the SNF choice to complete auth.  CSW spoke with pt regarding bed offers and she requested CSW speak with her daughter Arrie Aran.  CSW spoke with Dawn by phone, she asked for responses from higher rated facilities in [place].  Referral sent to [facility], [facility], [facility].   1330: Bed offers received from [facility], [facility], spoke to pt and daughter in room, they accept offer from [facility].  Discussed daughter providing transportation and she is able to do this, off work Architectural technologist, will come to the hospital in the AM.  Confirmed with [facility] they can accept tomorrow.  Facility choice given to Navi through the portal.   Expected Discharge Plan: [place] Barriers to Discharge: Continued Medical Work up  Expected Discharge Plan and Services Expected Discharge Plan: [place] In-house Referral: Clinical Social Work     Living arrangements for the past 2 months: Single Family Home                                       Social Determinants of Health (SDOH) Interventions    Readmission Risk Interventions     No data to display

## 2022-02-22 NOTE — Progress Notes (Signed)
Physical Therapy Treatment Patient Details Name: Cindy Whitehead MRN: [id number] DOB: 05/22/1935 Today's Date: 02/22/2022   History of Present Illness Pt is an 86 y/o female presenting after fall. B humeral fx. S/p R reverse TSA. L UE non operative. PMH significant of diabetes, asthma, hypertension, obesity, low back pain, adjustment disorder with anxiety and depression    PT Comments    Pt progressing towards physical therapy goals. RW utilized today to increase stability and progress ambulation distance. Therapist stabilized RW on the L side during gait training and transfers. While this is not a long term functional solution to an AD, pt required increased stability than the [facility] which was trialed on eval, and easier sequencing than the hemiwalker. Pt anticipates d/c to SNF rehab tomorrow. Will continue to follow and progress as able per POC.     Recommendations for follow up therapy are one component of a multi-disciplinary discharge planning process, led by the attending physician.  Recommendations may be updated based on patient status, additional functional criteria and insurance authorization.  Follow Up Recommendations  Skilled nursing-short term rehab (<3 hours/day) Can patient physically be transported by private vehicle: Yes   Assistance Recommended at Discharge Frequent or constant Supervision/Assistance  Patient can return home with the following A lot of help with walking and/or transfers;A lot of help with bathing/dressing/bathroom;Assistance with cooking/housework;Assist for transportation;Help with stairs or ramp for entrance   Equipment Recommendations  Other (comment) (TBD by next venue of care)    Recommendations for Other Services       Precautions / Restrictions Precautions Precautions: Fall;Shoulder Type of Shoulder Precautions: R reverse TSA Shoulder Interventions: Shoulder sling/immobilizer Precaution Booklet Issued: No Precaution Comments: Elbow distal ROM  allowed for both arms.R shoulder flexion to 90 degrees only for ADLs. L sling off for ADL and HEP. Restrictions Weight Bearing Restrictions: Yes RUE Weight Bearing: Partial weight bearing RUE Partial Weight Bearing Percentage or Pounds: 2lbs LUE Weight Bearing: Non weight bearing Other Position/Activity Restrictions: ? cane versus hemi walker     Mobility  Bed Mobility Overal bed mobility: Needs Assistance Bed Mobility: Sit to Supine     Supine to sit: Mod assist, HOB elevated Sit to supine: Mod assist   General bed mobility comments: Assist to elevate trunk to full sitting position. HOB elevated initially and pt was able to initiate LE advancement towards EOB. Increased time to scoot out fully and get feet on the floor, utilizing bed pad to assist on R side.    Transfers Overall transfer level: Needs assistance Equipment used: Rolling walker (2 wheels) Transfers: Sit to/from Stand Sit to Stand: Min assist           General transfer comment: Therapist stabilizing walker on the L and pt utilizing RW on the R. Assist to power up to full stand, and pt able to initiate well.    Ambulation/Gait Ambulation/Gait assistance: Mod assist Gait Distance (Feet): 10 Feet (x2) Assistive device: Rolling walker (2 wheels) Gait Pattern/deviations: Decreased stride length, Antalgic, Trunk flexed, Shuffle Gait velocity: Decreased Gait velocity interpretation: <1.31 ft/sec, indicative of household ambulator   General Gait Details: Therapist stabilizing RW on the L and pt bearing weight through the RUE onto the walker for support. Therapist assisting to steer walker, but overall pt stable and no LOB while holding walker.   Stairs             Wheelchair Mobility    Modified Rankin (Stroke Patients Only)       Balance  Overall balance assessment: Needs assistance Sitting-balance support: No upper extremity supported, Feet supported Sitting balance-Leahy Scale: Good      Standing balance support: No upper extremity supported Standing balance-Leahy Scale: Poor Standing balance comment: short choppy steps                            Cognition Arousal/Alertness: Awake/alert Behavior During Therapy: WFL for tasks assessed/performed Overall Cognitive Status: Within Functional Limits for tasks assessed                                          Exercises      General Comments        Pertinent Vitals/Pain Pain Assessment Pain Assessment: Faces Faces Pain Scale: Hurts little more Pain Location: B shoulders Pain Descriptors / Indicators: Sore, Aching, Operative site guarding Pain Intervention(s): Limited activity within patient's tolerance, Monitored during session, Repositioned    Home Living                          Prior Function            PT Goals (current goals can now be found in the care plan section) Acute Rehab PT Goals Patient Stated Goal: Eventually return back to her home and PLOF PT Goal Formulation: With patient/family Time For Goal Achievement: 02/27/22 Potential to Achieve Goals: Good Progress towards PT goals: Progressing toward goals    Frequency    Min 3X/week      PT Plan Current plan remains appropriate    Co-evaluation              AM-PAC PT "6 Clicks" Mobility   Outcome Measure  Help needed turning from your back to your side while in a flat bed without using bedrails?: A Lot Help needed moving from lying on your back to sitting on the side of a flat bed without using bedrails?: A Lot Help needed moving to and from a bed to a chair (including a wheelchair)?: A Lot Help needed standing up from a chair using your arms (e.g., wheelchair or bedside chair)?: A Lot Help needed to walk in hospital room?: A Lot Help needed climbing 3-5 steps with a railing? : Total 6 Click Score: 11    End of Session Equipment Utilized During Treatment: Gait belt Activity Tolerance:  Patient limited by fatigue Patient left: in chair;with call bell/phone within reach;with chair alarm set Nurse Communication: Mobility status PT Visit Diagnosis: Unsteadiness on feet (R26.81);Pain;Difficulty in walking, not elsewhere classified (R26.2) Pain - Right/Left: Right (Bilaterally) Pain - part of body: Shoulder;Arm     Time: 5102-5852 PT Time Calculation (min) (ACUTE ONLY): 29 min  Charges:  $Gait Training: 23-37 mins                     Rolinda Roan, PT, DPT Acute Rehabilitation Services Secure Chat Preferred Office: [phone]    Thelma Comp 02/22/2022, 3:43 PM

## 2022-02-22 NOTE — Progress Notes (Signed)
PROGRESS NOTE    Cindy Whitehead  UXN:[id number] DOB: 30-May-1935 DOA: 02/15/2022 PCP: Abner Greenspan, MD    Brief Narrative:  Cindy Whitehead is a 86 y.o. female from home with medical history significant of hyperlipidemia, diabetes, GERD, asthma, hypertension, obesity, low back pain, adjustment disorder with anxiety and depression presenting after fall.   Patient states that she was walking to take out her trash and tripped and fell.  She put both her arms out to break her fall and after landing on her arm she has pain in both arms right greater than left.  She additionally reports some tingling in her right hand.  She states that she did not hit her head nor lose consciousness.   S/p repair on right arm.  Will need SNF   Assessment and Plan: Right humerus fracture/Left humerus fracture s/p Fall > Patient presenting after mechanical fall while taking trash out.  She put both her arms out to catch herself and after catching herself she has had pain in both arms right greater than left. Imaging in ED showed bilateral humerus fractures.   -Patient evaluated by orthopedics or getting> CT scans of both shoulders plan for reverse total shoulder on the right on Friday and possible nonoperative management on the left pending CT results.-- await ortho consult - schedule tylenol, PRN pain meds cautiously -PT/OT- SNF   URI- resolved -xray without pna -NP swabs negative -pulm toilet- needs incentive spirometry  Hyperlipidemia - Continue home simvastatin   Diabetes - SSI   Hypertension GERD Asthma  - Listed in chart but not currently on any medications  -PRN nebs   Obesity Estimated body mass index is 34.18 kg/m as calculated from the following:   Height as of this encounter: 5' (1.524 m).   Weight as of this encounter: 79.4 kg.    DVT prophylaxis: SCD's Start: 02/18/22 1746 SCDs Start: 02/15/22 1440    Code Status: Full Code Family Communication: called daughter  12/18  Disposition Plan:  Level of care: Med-Surg Status is: Inpatient Remains inpatient appropriate because:needs SNF    Consultants:  ortho   Subjective:  No current complaints except she is sleepy  Objective: Vitals:   02/21/22 1439 02/21/22 2114 02/22/22 0418 02/22/22 0824  BP: (!) 139/56 (!) 156/57 (!) 136/50 (!) 150/67  Pulse: 73 80 65 73  Resp:  '16 16 18  '$ Temp: 98.5 F (36.9 C) 98.3 F (36.8 C) 98.2 F (36.8 C) 98 F (36.7 C)  TempSrc: Oral Oral    SpO2: 97% 95% 96% 95%  Weight:      Height:        Intake/Output Summary (Last 24 hours) at 02/22/2022 1204 Last data filed at 02/21/2022 1700 Gross per 24 hour  Intake --  Output 1 ml  Net -1 ml   Filed Weights   02/15/22 0938 02/18/22 1334  Weight: 79.4 kg 79.4 kg    Examination:   General: Appearance:    Obese female in no acute distress     Lungs:     respirations unlabored  Heart:    Normal heart rate. Normal rhythm. No murmurs, rubs, or gallops.   MS:   All extremities are intact.   Neurologic:   Awake, alert      Data Reviewed: I have personally reviewed following labs and imaging studies  CBC: Recent Labs  Lab 02/15/22 1637 02/16/22 0354 02/18/22 0339 02/19/22 0317 02/20/22 0253 02/22/22 0328  WBC 13.6* 12.6* 10.1 8.6 11.4* 8.4  NEUTROABS 12.5*  --   --   --   --   --   HGB 12.5 11.6* 10.2* 10.1* 9.7* 9.5*  HCT 38.1 32.9* 30.8* 29.3* 28.1* 28.1*  MCV 98.7 92.4 97.2 95.8 95.6 96.9  PLT 159 183 165 167 182 585   Basic Metabolic Panel: Recent Labs  Lab 02/16/22 0354 02/18/22 0339 02/19/22 0317 02/20/22 0253 02/22/22 0328  NA 137 135 137 138 138  K 4.3 4.3 4.5 4.3 4.1  CL 105 103 104 104 108  CO2 '22 24 25 27 25  '$ GLUCOSE 143* 157* 164* 125* 113*  BUN 25* '21 19 22 16  '$ CREATININE 0.92 0.84 0.89 0.78 0.78  CALCIUM 8.8* 8.5* 8.4* 8.3* 8.1*   GFR: Estimated Creatinine Clearance: 48.8 mL/min (by C-G formula based on SCr of 0.78 mg/dL). Liver Function Tests: Recent Labs  Lab  02/15/22 1637 02/16/22 0354  AST 32 26  ALT 18 17  ALKPHOS 63 55  BILITOT 0.8 1.0  PROT 6.6 6.1*  ALBUMIN 3.5 3.2*   No results for input(s): "LIPASE", "AMYLASE" in the last 168 hours. No results for input(s): "AMMONIA" in the last 168 hours. Coagulation Profile: Recent Labs  Lab 02/15/22 1637  INR 1.2   Cardiac Enzymes: No results for input(s): "CKTOTAL", "CKMB", "CKMBINDEX", "TROPONINI" in the last 168 hours. BNP (last 3 results) No results for input(s): "PROBNP" in the last 8760 hours. HbA1C: No results for input(s): "HGBA1C" in the last 72 hours. CBG: Recent Labs  Lab 02/21/22 1130 02/21/22 1613 02/21/22 1945 02/22/22 0824 02/22/22 1137  GLUCAP 103* 121* 103* 133* 132*   Lipid Profile: No results for input(s): "CHOL", "HDL", "LDLCALC", "TRIG", "CHOLHDL", "LDLDIRECT" in the last 72 hours. Thyroid Function Tests: No results for input(s): "TSH", "T4TOTAL", "FREET4", "T3FREE", "THYROIDAB" in the last 72 hours. Anemia Panel: No results for input(s): "VITAMINB12", "FOLATE", "FERRITIN", "TIBC", "IRON", "RETICCTPCT" in the last 72 hours. Sepsis Labs: No results for input(s): "PROCALCITON", "LATICACIDVEN" in the last 168 hours.  Recent Results (from the past 240 hour(s))  Respiratory (~20 pathogens) panel by PCR     Status: None   Collection Time: 02/16/22  8:36 AM   Specimen: Nasopharyngeal Swab; Respiratory  Result Value Ref Range Status   Adenovirus NOT DETECTED NOT DETECTED Final   Coronavirus 229E NOT DETECTED NOT DETECTED Final    Comment: (NOTE) The Coronavirus on the Respiratory Panel, DOES NOT test for the novel  Coronavirus (2019 nCoV)    Coronavirus HKU1 NOT DETECTED NOT DETECTED Final   Coronavirus NL63 NOT DETECTED NOT DETECTED Final   Coronavirus OC43 NOT DETECTED NOT DETECTED Final   Metapneumovirus NOT DETECTED NOT DETECTED Final   Rhinovirus / Enterovirus NOT DETECTED NOT DETECTED Final   Influenza A NOT DETECTED NOT DETECTED Final   Influenza B  NOT DETECTED NOT DETECTED Final   Parainfluenza Virus 1 NOT DETECTED NOT DETECTED Final   Parainfluenza Virus 2 NOT DETECTED NOT DETECTED Final   Parainfluenza Virus 3 NOT DETECTED NOT DETECTED Final   Parainfluenza Virus 4 NOT DETECTED NOT DETECTED Final   Respiratory Syncytial Virus NOT DETECTED NOT DETECTED Final   Bordetella pertussis NOT DETECTED NOT DETECTED Final   Bordetella Parapertussis NOT DETECTED NOT DETECTED Final   Chlamydophila pneumoniae NOT DETECTED NOT DETECTED Final   Mycoplasma pneumoniae NOT DETECTED NOT DETECTED Final    Comment: Performed at [facility], [place]. [address], [place], [place] [address]  Resp panel by RT-PCR (RSV, Flu A&B, Covid) Anterior Nasal Swab  Status: None   Collection Time: 02/16/22  8:36 AM   Specimen: Anterior Nasal Swab  Result Value Ref Range Status   SARS Coronavirus 2 by RT PCR NEGATIVE NEGATIVE Final    Comment: (NOTE) SARS-CoV-2 target nucleic acids are NOT DETECTED.  The SARS-CoV-2 RNA is generally detectable in upper respiratory specimens during the acute phase of infection. The lowest concentration of SARS-CoV-2 viral copies this assay can detect is 138 copies/mL. A negative result does not preclude SARS-Cov-2 infection and should not be used as the sole basis for treatment or other patient management decisions. A negative result may occur with  improper specimen collection/handling, submission of specimen other than nasopharyngeal swab, presence of viral mutation(s) within the areas targeted by this assay, and inadequate number of viral copies(<138 copies/mL). A negative result must be combined with clinical observations, patient history, and epidemiological information. The expected result is Negative.  Fact Sheet for Patients:  EntrepreneurPulse.com.au  Fact Sheet for Healthcare Providers:  IncredibleEmployment.be  This test is no t yet approved or cleared by the [place]  FDA and  has been authorized for detection and/or diagnosis of SARS-CoV-2 by FDA under an Emergency Use Authorization (EUA). This EUA will remain  in effect (meaning this test can be used) for the duration of the COVID-19 declaration under Section 564(b)(1) of the Act, 21 U.S.C.section 360bbb-3(b)(1), unless the authorization is terminated  or revoked sooner.       Influenza A by PCR NEGATIVE NEGATIVE Final   Influenza B by PCR NEGATIVE NEGATIVE Final    Comment: (NOTE) The Xpert Xpress SARS-CoV-2/FLU/RSV plus assay is intended as an aid in the diagnosis of influenza from Nasopharyngeal swab specimens and should not be used as a sole basis for treatment. Nasal washings and aspirates are unacceptable for Xpert Xpress SARS-CoV-2/FLU/RSV testing.  Fact Sheet for Patients: EntrepreneurPulse.com.au  Fact Sheet for Healthcare Providers: IncredibleEmployment.be  This test is not yet approved or cleared by the [place] FDA and has been authorized for detection and/or diagnosis of SARS-CoV-2 by FDA under an Emergency Use Authorization (EUA). This EUA will remain in effect (meaning this test can be used) for the duration of the COVID-19 declaration under Section 564(b)(1) of the Act, 21 U.S.C. section 360bbb-3(b)(1), unless the authorization is terminated or revoked.     Resp Syncytial Virus by PCR NEGATIVE NEGATIVE Final    Comment: (NOTE) Fact Sheet for Patients: EntrepreneurPulse.com.au  Fact Sheet for Healthcare Providers: IncredibleEmployment.be  This test is not yet approved or cleared by the [place] FDA and has been authorized for detection and/or diagnosis of SARS-CoV-2 by FDA under an Emergency Use Authorization (EUA). This EUA will remain in effect (meaning this test can be used) for the duration of the COVID-19 declaration under Section 564(b)(1) of the Act, 21 U.S.C. section  360bbb-3(b)(1), unless the authorization is terminated or revoked.  Performed at [facility], [place] [address], [place], [place] [address]   Surgical pcr screen     Status: Abnormal   Collection Time: 02/18/22 12:13 PM   Specimen: Nasal Mucosa; Nasal Swab  Result Value Ref Range Status   MRSA, PCR NEGATIVE NEGATIVE Final   Staphylococcus aureus POSITIVE (A) NEGATIVE Final    Comment: (NOTE) The Xpert SA Assay (FDA approved for NASAL specimens in patients 36 years of age and older), is one component of a comprehensive surveillance program. It is not intended to diagnose infection nor to guide or monitor treatment. Performed at [facility], [address] [place], [place] [address]          Radiology Studies: No results found.      Scheduled Meds:  Chlorhexidine Gluconate Cloth  6 each Topical Q0600   docusate sodium  100 mg Oral BID   guaiFENesin  600 mg Oral BID   insulin aspart  0-9 Units Subcutaneous TID WC   mupirocin ointment  1 Application Nasal BID   polyethylene glycol  17 g Oral BID   prochlorperazine  10 mg Intravenous Q6H   simvastatin  20 mg Oral q1800   sodium chloride flush  3 mL Intravenous Q12H   Continuous Infusions:   LOS: 7 days    Time spent: 45 minutes spent on chart review, discussion with nursing staff, consultants, updating family and interview/physical exam; more than 50% of that time was spent in counseling and/or coordination of care.    Geradine Girt, DO [facility] Available via Epic secure chat 7am-7pm After these hours, please refer to coverage provider listed on [email or web address] 02/22/2022, 12:04 PM

## 2022-02-22 NOTE — Progress Notes (Signed)
Occupational Therapy Treatment Patient Details Name: Cindy Whitehead MRN: [id number] DOB: 02/08/1936 Today's Date: 02/22/2022   History of present illness Pt is an 86 y.o. female with medical history significant of diabetes, asthma, hypertension, obesity, low back pain, adjustment disorder with anxiety and depression presenting after fall.  B humeral fx.  S/p R reverse TSA.  L UE non operative.   OT comments  This 86 yo female admitted with above presents to acute OT with making progress with sit<>stand and transfers as well as starting to work on Bil UE AROM (elbows distally) since block has worn off. We will continue to follow with recommendation for SNF.   Recommendations for follow up therapy are one component of a multi-disciplinary discharge planning process, led by the attending physician.  Recommendations may be updated based on patient status, additional functional criteria and insurance authorization.    Follow Up Recommendations  Skilled nursing-short term rehab (<3 hours/day)     Assistance Recommended at Discharge Frequent or constant Supervision/Assistance  Patient can return home with the following  Help with stairs or ramp for entrance;Assist for transportation;Assistance with cooking/housework;A lot of help with walking and/or transfers;A lot of help with bathing/dressing/bathroom;Assistance with feeding   Equipment Recommendations  None recommended by OT       Precautions / Restrictions Precautions Precautions: Fall;Shoulder Type of Shoulder Precautions: R reverse TSA Shoulder Interventions: Shoulder sling/immobilizer Precaution Comments: Elbow distal ROM allowed for both arms.R shoulder flexion to 90 degrees only for ADLs. L sling off for ADL and HEP. Restrictions Weight Bearing Restrictions: Yes RUE Weight Bearing: Partial weight bearing RUE Partial Weight Bearing Percentage or Pounds: 2lbs LUE Weight Bearing: Non weight bearing       Mobility Bed  Mobility Overal bed mobility: Needs Assistance Bed Mobility: Sit to Supine       Sit to supine: Mod assist (A for legs while pt laid her trunk down, then verbals for her to reposition herself in bed.)        Transfers Overall transfer level: Needs assistance Equipment used:  (1 person A around her waist) Transfers: Sit to/from Stand, Bed to chair/wheelchair/BSC Sit to Stand: Min assist     Step pivot transfers: Min assist           Balance Overall balance assessment: Needs assistance Sitting-balance support: No upper extremity supported, Feet supported Sitting balance-Leahy Scale: Good     Standing balance support: No upper extremity supported Standing balance-Leahy Scale: Poor                             ADL either performed or assessed with clinical judgement   ADL Overall ADL's : Needs assistance/impaired                         Toilet Transfer: Minimal assistance;Stand-pivot;BSC/3in1   Toileting- Clothing Manipulation and Hygiene: Total assistance Toileting - Clothing Manipulation Details (indicate cue type and reason): min A sit<>stand with min guard A to maintain standing for therapist to do back peri care       General ADL Comments: Spoke with dtr about an add on bidet for pt at home when she discharges from SNF, using a towel on a seat surface for her to sit on and "dry off" once off the toilet, a shirt 1-2 sizes bigger with putting RUE in first, then LUE, (getting shirt sleeves all the way up to arm pits then head  in shirt (reverse to take shirt off), elastic waist pants, slip on shoes. No good solution for socks.    Extremity/Trunk Assessment Upper Extremity Assessment Upper Extremity Assessment: RUE deficits/detail;LUE deficits/detail RUE Deficits / Details: Full AROM elbow distally exception for supination limited (question if this was pta) RUE Coordination: decreased gross motor LUE Deficits / Details: Full AROM elbow distally  exception for supination limited (question if this was pta) LUE Coordination: decreased gross motor            Vision Patient Visual Report: No change from baseline            Cognition Arousal/Alertness: Awake/alert Behavior During Therapy: WFL for tasks assessed/performed Overall Cognitive Status: Within Functional Limits for tasks assessed                                          Exercises Other Exercises Other Exercises: Supine in bed pt performed 10 reps of AROM for elbow, forearm, wrist and hand Bil with S. AAROM for R shoulder to ~20 degrees again while supine in bed.            Pertinent Vitals/ Pain       Pain Assessment Pain Assessment: 0-10 Faces Pain Scale: Hurts little more Pain Location: R shoulder more than L shoulder with exercises (with elbow distally) Pain Descriptors / Indicators: Sore, Aching Pain Intervention(s): Limited activity within patient's tolerance, Monitored during session, Repositioned         Frequency  Min 4X/week (bil shoulder pt)        Progress Toward Goals  OT Goals(current goals can now be found in the care plan section)  Progress towards OT goals: Progressing toward goals  Acute Rehab OT Goals Patient Stated Goal: go to rehab then home OT Goal Formulation: With patient/family Time For Goal Achievement: 02/22/22 Potential to Achieve Goals: Good  Plan Discharge plan remains appropriate       AM-PAC OT "6 Clicks" Daily Activity     Outcome Measure   Help from another person eating meals?: Total Help from another person taking care of personal grooming?: Total Help from another person toileting, which includes using toliet, bedpan, or urinal?: A Lot (can help with transfer but not with clothing or hygiene) Help from another person bathing (including washing, rinsing, drying)?: Total Help from another person to put on and taking off regular upper body clothing?: Total Help from another person to put  on and taking off regular lower body clothing?: Total 6 Click Score: 7    End of Session Equipment Utilized During Treatment:  (Bil  UE slings)  OT Visit Diagnosis: Unsteadiness on feet (R26.81);Other abnormalities of gait and mobility (R26.89);History of falling (Z91.81);Pain Pain - Right/Left:  (both) Pain - part of body: Shoulder   Activity Tolerance Patient tolerated treatment well   Patient Left in bed;with call bell/phone within reach;with bed alarm set           Time: 0630-1601 OT Time Calculation (min): 53 min  Charges: OT General Charges $OT Visit: 1 Visit OT Evaluation $OT Eval Moderate Complexity: 1 Mod OT Treatments $Self Care/Home Management : 23-37 mins $Therapeutic Exercise: 23-37 mins  [address] [address], OTR/L Acute Rehab Services [facility] [phone] Office [phone]    Almon Register 02/22/2022, 12:10 PM

## 2022-02-23 ENCOUNTER — Other Ambulatory Visit: Payer: Self-pay | Admitting: Student

## 2022-02-23 DIAGNOSIS — E119 Type 2 diabetes mellitus without complications: Secondary | ICD-10-CM | POA: Diagnosis not present

## 2022-02-23 DIAGNOSIS — Z9181 History of falling: Secondary | ICD-10-CM | POA: Diagnosis not present

## 2022-02-23 DIAGNOSIS — R2689 Other abnormalities of gait and mobility: Secondary | ICD-10-CM | POA: Diagnosis not present

## 2022-02-23 DIAGNOSIS — I10 Essential (primary) hypertension: Secondary | ICD-10-CM | POA: Diagnosis not present

## 2022-02-23 DIAGNOSIS — E785 Hyperlipidemia, unspecified: Secondary | ICD-10-CM | POA: Diagnosis not present

## 2022-02-23 DIAGNOSIS — S42202D Unspecified fracture of upper end of left humerus, subsequent encounter for fracture with routine healing: Secondary | ICD-10-CM | POA: Diagnosis not present

## 2022-02-23 DIAGNOSIS — Z741 Need for assistance with personal care: Secondary | ICD-10-CM | POA: Diagnosis not present

## 2022-02-23 DIAGNOSIS — S42291A Other displaced fracture of upper end of right humerus, initial encounter for closed fracture: Secondary | ICD-10-CM | POA: Diagnosis not present

## 2022-02-23 DIAGNOSIS — E1169 Type 2 diabetes mellitus with other specified complication: Secondary | ICD-10-CM | POA: Diagnosis not present

## 2022-02-23 DIAGNOSIS — Z96611 Presence of right artificial shoulder joint: Secondary | ICD-10-CM | POA: Diagnosis not present

## 2022-02-23 DIAGNOSIS — Z4789 Encounter for other orthopedic aftercare: Secondary | ICD-10-CM | POA: Diagnosis not present

## 2022-02-23 DIAGNOSIS — S42202A Unspecified fracture of upper end of left humerus, initial encounter for closed fracture: Secondary | ICD-10-CM | POA: Diagnosis not present

## 2022-02-23 DIAGNOSIS — S42201D Unspecified fracture of upper end of right humerus, subsequent encounter for fracture with routine healing: Secondary | ICD-10-CM | POA: Diagnosis not present

## 2022-02-23 DIAGNOSIS — R278 Other lack of coordination: Secondary | ICD-10-CM | POA: Diagnosis not present

## 2022-02-23 LAB — GLUCOSE, CAPILLARY
Glucose-Capillary: 172 mg/dL — ABNORMAL HIGH (ref 70–99)
Glucose-Capillary: 128 mg/dL — ABNORMAL HIGH (ref 70–99)

## 2022-02-23 MED ORDER — OXYCODONE-ACETAMINOPHEN 5-325 MG PO TABS: 1.0000 | ORAL_TABLET | Freq: Four times a day (QID) | ORAL | 0 refills | Status: DC | PRN

## 2022-02-23 NOTE — Discharge Summary (Signed)
Physician Discharge Summary  Cindy Whitehead FGH:[id number] DOB: 30-Jun-1935 DOA: 02/15/2022  PCP: Abner Greenspan, MD  Admit date: 02/15/2022 Discharge date: 02/23/2022 30 Day Unplanned Readmission Risk Score    Flowsheet Row ED to Hosp-Admission (Current) from 02/15/2022 in [facility]  30 Day Unplanned Readmission Risk Score (%) 14.74 Filed at 02/23/2022 0801       This score is the patient's risk of an unplanned readmission within 30 days of being discharged (0 -100%). The score is based on dignosis, age, lab data, medications, orders, and past utilization.   Low:  0-14.9   Medium: 15-21.9   High: 22-29.9   Extreme: 30 and above          Admitted From: Home Disposition: SNF  Recommendations for Outpatient Follow-up:  Follow up with PCP in 1-2 weeks Please obtain BMP/CBC in one week Follow-up with orthopedics as they have already scheduled Please follow up with your PCP on the following pending results: Unresulted Labs (From admission, onward)    None         Home Health: None Equipment/Devices: None  Discharge Condition: Stable CODE STATUS: Full code Diet recommendation: Cardiac  Subjective: Seen and examined, other than mild to moderate bilateral shoulder pain, no complaints.  She feels ready for discharge.  Daughter at the bedside.  Brief/Interim Summary: Cindy Whitehead is a 86 y.o. female from home with medical history significant of hyperlipidemia, diabetes, GERD, asthma, hypertension, obesity, low back pain, adjustment disorder with anxiety and depression presented after fall and was diagnosed with right humeral fracture and left humeral fracture.  Admitted to hospital service, orthopedics consulted.  Patient underwent surgical repair of the right middle fracture/shoulder fracture but on the left, they recommended nonoperative management, she has sling.  She can move her right forearm and left forearm, nonweightbearing on the left  but no more than 2 pound weight bearing on the right upper extremity.  She will follow-up with orthopedics, she is cleared for discharge, PT OT recommended SNF.   URI- resolved -xray without pna -NP swabs negative -pulm toilet- needs incentive spirometry   Hyperlipidemia - Continue home simvastatin   Diabetes melitis type II -Resume home medications.   Hypertension GERD Asthma  - Listed in chart but not currently on any medications  -PRN nebs    Discharge plan was discussed with patient and/or family member and they verbalized understanding and agreed with it.  Discharge Diagnoses:  Principal Problem:   Closed fracture of right proximal humerus Active Problems:   Diabetes type 2, controlled ([place])   Hyperlipidemia associated with type 2 diabetes mellitus (HCC)   Class 2 obesity due to excess calories with body mass index (BMI) of 38.0 to 38.9 in adult   Essential hypertension, benign   Asthma   GERD (gastroesophageal reflux disease)   Closed fracture of left proximal humerus   Fall at home, initial encounter    Discharge Instructions   Allergies as of 02/23/2022       Reactions   Atorvastatin Other (See Comments)   REACTION: leg pain   Ace Inhibitors Other (See Comments)   cough        Medication List     STOP taking these medications    naproxen 500 MG tablet Commonly known as: NAPROSYN       TAKE these medications    DELSYM COUGH + SORE THROAT PO Take 5 mLs by mouth daily as needed (cough).   glucose blood  test strip Commonly known as: ONE TOUCH ULTRA TEST Use to check blood sugar once daily (dx. E11.9)   metFORMIN 500 MG tablet Commonly known as: GLUCOPHAGE Take 0.5 tablets (250 mg total) by mouth 2 (two) times daily.   multivitamin with minerals Tabs tablet Take 1 tablet by mouth daily.   ondansetron 4 MG tablet Commonly known as: Zofran Take 1 tablet (4 mg total) by mouth every 8 (eight) hours as needed for nausea or vomiting.   ONE  TOUCH ULTRA MINI w/Device Kit Use to check blood sugar once daily (dx. Z16.9)   OneTouch Delica Lancets 67E Misc Use to check blood sugar once daily (dx. E11.9)   oxybutynin 10 MG 24 hr tablet Commonly known as: DITROPAN-XL Take 1 tablet (10 mg total) by mouth at bedtime.   oxyCODONE-acetaminophen 5-325 MG tablet Commonly known as: Percocet Take 1 tablet by mouth every 6 (six) hours as needed for severe pain.   simvastatin 20 MG tablet Commonly known as: ZOCOR TAKE 1/2 TABLET BY MOUTH EVERY EVENING WITH A LOW FAT SNACK        Contact information for follow-up providers     Nicholes Stairs, MD Follow up in 2 week(s).   Specialty: Orthopedic Surgery Contact information: [address] [place] [address] [phone]         Abner Greenspan, MD Follow up in 1 week(s).   Specialties: Family Medicine, Radiology Contact information: [address] [place] [address] [phone]              Contact information for after-discharge care     Destination     [facility] .   Service: Skilled Nursing Contact information: [place] [address] [phone]                    Allergies  Allergen Reactions   Atorvastatin Other (See Comments)    REACTION: leg pain   Ace Inhibitors Other (See Comments)    cough    Consultations: Orthopedics   Procedures/Studies: DG Shoulder Right Port  Result Date: 02/18/2022 CLINICAL DATA:  Status post shoulder arthroplasty EXAM: RIGHT SHOULDER - 1 VIEW COMPARISON:  Right shoulder x-ray 02/15/2022 FINDINGS: There is a new right shoulder total arthroplasty in anatomic alignment. There is overlying soft tissue swelling and air compatible with recent surgery. No fracture. There are mild degenerative changes of the acromioclavicular joint. IMPRESSION: Right shoulder total arthroplasty in anatomic alignment. Electronically Signed   By: Ronney Asters M.D.   On: 02/18/2022 17:17   DG CHEST PORT 1 VIEW  Result Date: 02/16/2022 CLINICAL DATA:  Pneumonia, cough. EXAM: PORTABLE CHEST 1 VIEW COMPARISON:  March 20, 2020. FINDINGS: Stable cardiomediastinal silhouette. Minimal bibasilar subsegmental atelectasis is noted. Severely displaced proximal right humeral fracture is noted. IMPRESSION: Minimal bibasilar subsegmental atelectasis. Severely displaced proximal right humeral fracture. Electronically Signed   By: Marijo Conception M.D.   On: 02/16/2022 10:21   CT SHOULDER LEFT WO CONTRAST  Result Date: 02/15/2022 CLINICAL DATA:  Shoulder trauma, fracture of humerus or scapula EXAM: CT OF THE UPPER LEFT EXTREMITY WITHOUT CONTRAST TECHNIQUE: Multidetector CT imaging of the upper left extremity was performed according to the standard protocol. RADIATION DOSE REDUCTION: This exam was performed according to the departmental dose-optimization program which includes automated exposure control, adjustment of the mA and/or kV according to patient size and/or use of iterative reconstruction technique. COMPARISON:  Radiograph 02/15/2022 FINDINGS:  Bones/Joint/Cartilage There is a comminuted fracture of the proximal humerus through the surgical neck with impaction. Extension through the greater tuberosity with significant displacement. No humeral head splitting component. No humeral shaft fracture. No glenoid fracture or significant glenoid bone loss. Small joint effusion. Mild glenohumeral and AC joint osteoarthritis. Ligaments Suboptimally assessed by CT. Muscles and Tendons No significant muscle atrophy. Soft tissues Soft tissue swelling along the left shoulder. IMPRESSION: Comminuted fracture of the proximal humerus through the surgical neck with impaction. Extension through the greater tuberosity with significant displacement. No head splitting component. No glenoid fracture or significant glenoid bone loss. Small joint effusion. Electronically Signed   By:  Maurine Simmering M.D.   On: 02/15/2022 16:48   CT SHOULDER RIGHT WO CONTRAST  Result Date: 02/15/2022 CLINICAL DATA:  Shoulder trauma, fracture of humerus or scapula EXAM: CT OF THE UPPER RIGHT EXTREMITY WITHOUT CONTRAST TECHNIQUE: Multidetector CT imaging of the upper right extremity was performed according to the standard protocol. RADIATION DOSE REDUCTION: This exam was performed according to the departmental dose-optimization program which includes automated exposure control, adjustment of the mA and/or kV according to patient size and/or use of iterative reconstruction technique. COMPARISON:  Right humerus radiograph 02/15/2022 FINDINGS: Bones/Joint/Cartilage There is a comminuted fracture of the proximal humerus through the surgical neck with severe displacement. There is extension through the greater tuberosity with up to 7 mm displacement. There is fracture extension through the far posterosuperior humeral head articular surface but no large head splitting component. Impaction with small fracture fragment along the lesser tuberosity. Posterior subluxation of the humeral head with respect to the glenoid. No glenoid fracture or significant glenoid bone loss. Small lipohemarthrosis. Mild AC joint osteoarthritis. Severe multilevel degenerative changes in the cervical spine, partially visualized. Ligaments Suboptimally assessed by CT. Muscles and Tendons The no significant muscle atrophy. Soft tissues Soft tissue swelling along the right shoulder. Mild interlobular septal thickening in the right lung apex. IMPRESSION: Comminuted fracture of the proximal humerus through the surgical neck with severe displacement. Extension through the greater tuberosity with up to 7 mm displacement. Fracture extension through the far posterosuperior humeral head articular surface but no large humeral head splitting component. Posterior subluxation of the humeral head with respect to the glenoid. No glenoid fracture or significant  glenoid bone loss. Small lipohemarthrosis. Electronically Signed   By: Maurine Simmering M.D.   On: 02/15/2022 16:38   DG Elbow 2 Views Right  Result Date: 02/15/2022 CLINICAL DATA:  86 year old female status post fall on driveway. Pain. EXAM: RIGHT ELBOW - 2 VIEW COMPARISON:  Right humerus series today. FINDINGS: Two somewhat oblique views on this exam, similar to the elbow visualization on the humerus series today. Grossly maintained alignment and joint spaces. No obvious fracture at the elbow. Limited evaluation for joint effusion due to obliquity. Chronic degenerative changes at the lateral supracondylar humerus. IMPRESSION: Suboptimal due to oblique positioning. No acute fracture or dislocation identified about the right elbow. Electronically Signed   By: Genevie Ann M.D.   On: 02/15/2022 11:59   DG Shoulder Left  Result Date: 02/15/2022 CLINICAL DATA:  86 year old female status post fall on driveway. Pain. EXAM: LEFT SHOULDER - 2+ VIEW COMPARISON:  Left humerus series today. FINDINGS: On these images fracture of the left humerus neck appears mildly impacted and anteriorly displaced. 4 cm posterolateral comminution fragment from the head redemonstrated. No significant glenohumeral dislocation underlying. Visible left scapula and clavicle appear to remain intact. Visible left ribs appear intact. IMPRESSION: 1. Comminuted proximal  left humerus fracture as demonstrated on the humerus series today. On these images there does appear to be mild impaction and anterior displacement at the humeral neck. 2. No obvious superimposed glenohumeral dislocation. There is a 4 cm comminution fragment of the humeral head. 3. Left scapula and visible clavicle appear to remain intact. Electronically Signed   By: Genevie Ann M.D.   On: 02/15/2022 11:57   DG Humerus Left  Result Date: 02/15/2022 CLINICAL DATA:  86 year old female status post fall on driveway. Pain. EXAM: LEFT HUMERUS - 2+ VIEW COMPARISON:  Chest radiographs  03/20/2020. FINDINGS: There is a fracture of the proximal left humerus with a roughly 4 cm comminution fragment displaced posteriorly and laterally from the humeral head. The humeral neck may also be fractured, but there is minimal displacement there. Alignment appears maintained at the left elbow. Grossly intact visible left scapula and clavicle. IMPRESSION: Comminuted proximal left humerus fracture including a 4 cm fragment displaced posteriorly and laterally from the head. Probable minimally displaced fracture at the neck. Electronically Signed   By: Genevie Ann M.D.   On: 02/15/2022 11:55   DG Humerus Right  Result Date: 02/15/2022 CLINICAL DATA:  86 year old female status post fall on driveway. Pain. EXAM: RIGHT HUMERUS - 2+ VIEW COMPARISON:  Right shoulder series today. FINDINGS: As reported separately both the humeral head and neck appear fractured. Up to 4 cm comminution fragments at the humeral head are new from chest radiographs last year. Medial/axillary displacement and over-riding. The more distal right humerus appears intact. Maintained alignment at the right elbow. As on the shoulder series today both the scapula and clavicle appear grossly intact, stable visible right chest. IMPRESSION: 1. Comminuted, displaced and overriding proximal right humerus fracture as described on the shoulder today. 2. Distal right humerus appears intact. No other acute osseous abnormality identified. Electronically Signed   By: Genevie Ann M.D.   On: 02/15/2022 11:54   DG Shoulder Right  Result Date: 02/15/2022 CLINICAL DATA:  86 year old female status post fall on driveway. Pain. EXAM: RIGHT SHOULDER - 2+ VIEW COMPARISON:  Chest radiographs 03/20/2020. FINDINGS: Transverse or mildly oblique fracture of the proximal right humerus appears to be at the level of the neck with 1 full shaft width medial/axillary displacement and over-riding of at least 6 cm. Humeral head may remain aligned with the glenoid, but also appears  comminuted on these images. No obvious right clavicle or scapula fracture. Visible right ribs and chest appear stable. IMPRESSION: Comminuted proximal right humerus fracture, probably involving both the head and the neck. Distal fragment one full shaft width medial/axillary displacement and over-riding of at least 6 cm. Electronically Signed   By: Genevie Ann M.D.   On: 02/15/2022 11:50     Discharge Exam: Vitals:   02/22/22 2027 02/23/22 0741  BP: (!) 154/70 (!) 129/54  Pulse: 77 74  Resp: 16   Temp: 98.2 F (36.8 C) 97.8 F (36.6 C)  SpO2: 96% 96%   Vitals:   02/22/22 0824 02/22/22 1327 02/22/22 2027 02/23/22 0741  BP: (!) 150/67 (!) 155/75 (!) 154/70 (!) 129/54  Pulse: 73 79 77 74  Resp: _0 Temp: 98 F (36.7 C) 98 F (36.7 C) 98.2 F (36.8 C) 97.8 F (36.6 C)  TempSrc:    Oral  SpO2: 95% 95% 96% 96%  Weight:      Height:        General: Pt is alert, awake, not in acute distress Cardiovascular: RRR, S1/S2 +,  no rubs, no gallops Respiratory: CTA bilaterally, no wheezing, no rhonchi Abdominal: Soft, NT, ND, bowel sounds + Extremities: no edema, no cyanosis, sling in the left arm.    The results of significant diagnostics from this hospitalization (including imaging, microbiology, ancillary and laboratory) are listed below for reference.     Microbiology: Recent Results (from the past 240 hour(s))  Respiratory (~20 pathogens) panel by PCR     Status: None   Collection Time: 02/16/22  8:36 AM   Specimen: Nasopharyngeal Swab; Respiratory  Result Value Ref Range Status   Adenovirus NOT DETECTED NOT DETECTED Final   Coronavirus 229E NOT DETECTED NOT DETECTED Final    Comment: (NOTE) The Coronavirus on the Respiratory Panel, DOES NOT test for the novel  Coronavirus (2019 nCoV)    Coronavirus HKU1 NOT DETECTED NOT DETECTED Final   Coronavirus NL63 NOT DETECTED NOT DETECTED Final   Coronavirus OC43 NOT DETECTED NOT DETECTED Final   Metapneumovirus NOT DETECTED NOT  DETECTED Final   Rhinovirus / Enterovirus NOT DETECTED NOT DETECTED Final   Influenza A NOT DETECTED NOT DETECTED Final   Influenza B NOT DETECTED NOT DETECTED Final   Parainfluenza Virus 1 NOT DETECTED NOT DETECTED Final   Parainfluenza Virus 2 NOT DETECTED NOT DETECTED Final   Parainfluenza Virus 3 NOT DETECTED NOT DETECTED Final   Parainfluenza Virus 4 NOT DETECTED NOT DETECTED Final   Respiratory Syncytial Virus NOT DETECTED NOT DETECTED Final   Bordetella pertussis NOT DETECTED NOT DETECTED Final   Bordetella Parapertussis NOT DETECTED NOT DETECTED Final   Chlamydophila pneumoniae NOT DETECTED NOT DETECTED Final   Mycoplasma pneumoniae NOT DETECTED NOT DETECTED Final    Comment: Performed at [facility], [place]. [address] [place], [place] [address]  Resp panel by RT-PCR (RSV, Flu A&B, Covid) Anterior Nasal Swab     Status: None   Collection Time: 02/16/22  8:36 AM   Specimen: Anterior Nasal Swab  Result Value Ref Range Status   SARS Coronavirus 2 by RT PCR NEGATIVE NEGATIVE Final    Comment: (NOTE) SARS-CoV-2 target nucleic acids are NOT DETECTED.  The SARS-CoV-2 RNA is generally detectable in upper respiratory specimens during the acute phase of infection. The lowest concentration of SARS-CoV-2 viral copies this assay can detect is 138 copies/mL. A negative result does not preclude SARS-Cov-2 infection and should not be used as the sole basis for treatment or other patient management decisions. A negative result may occur with  improper specimen collection/handling, submission of specimen other than nasopharyngeal swab, presence of viral mutation(s) within the areas targeted by this assay, and inadequate number of viral copies(<138 copies/mL). A negative result must be combined with clinical observations, patient history, and epidemiological information. The expected result is Negative.  Fact Sheet for Patients:  EntrepreneurPulse.com.au  Fact Sheet  for Healthcare Providers:  IncredibleEmployment.be  This test is no t yet approved or cleared by the [place] FDA and  has been authorized for detection and/or diagnosis of SARS-CoV-2 by FDA under an Emergency Use Authorization (EUA). This EUA will remain  in effect (meaning this test can be used) for the duration of the COVID-19 declaration under Section 564(b)(1) of the Act, 21 U.S.C.section 360bbb-3(b)(1), unless the authorization is terminated  or revoked sooner.       Influenza A by PCR NEGATIVE NEGATIVE Final   Influenza B by PCR NEGATIVE NEGATIVE Final    Comment: (NOTE) The Xpert Xpress SARS-CoV-2/FLU/RSV plus assay is intended as an aid in the diagnosis  of influenza from Nasopharyngeal swab specimens and should not be used as a sole basis for treatment. Nasal washings and aspirates are unacceptable for Xpert Xpress SARS-CoV-2/FLU/RSV testing.  Fact Sheet for Patients: EntrepreneurPulse.com.au  Fact Sheet for Healthcare Providers: IncredibleEmployment.be  This test is not yet approved or cleared by the [place] FDA and has been authorized for detection and/or diagnosis of SARS-CoV-2 by FDA under an Emergency Use Authorization (EUA). This EUA will remain in effect (meaning this test can be used) for the duration of the COVID-19 declaration under Section 564(b)(1) of the Act, 21 U.S.C. section 360bbb-3(b)(1), unless the authorization is terminated or revoked.     Resp Syncytial Virus by PCR NEGATIVE NEGATIVE Final    Comment: (NOTE) Fact Sheet for Patients: EntrepreneurPulse.com.au  Fact Sheet for Healthcare Providers: IncredibleEmployment.be  This test is not yet approved or cleared by the [place] FDA and has been authorized for detection and/or diagnosis of SARS-CoV-2 by FDA under an Emergency Use Authorization (EUA). This EUA will remain in effect (meaning  this test can be used) for the duration of the COVID-19 declaration under Section 564(b)(1) of the Act, 21 U.S.C. section 360bbb-3(b)(1), unless the authorization is terminated or revoked.  Performed at [facility], [place] [address] [place], [place] [address]   Surgical pcr screen     Status: Abnormal   Collection Time: 02/18/22 12:13 PM   Specimen: Nasal Mucosa; Nasal Swab  Result Value Ref Range Status   MRSA, PCR NEGATIVE NEGATIVE Final   Staphylococcus aureus POSITIVE (A) NEGATIVE Final    Comment: (NOTE) The Xpert SA Assay (FDA approved for NASAL specimens in patients 56 years of age and older), is one component of a comprehensive surveillance program. It is not intended to diagnose infection nor to guide or monitor treatment. Performed at [facility], [place] [address], [place], [place] [address]      Labs: BNP (last 3 results) No results for input(s): "BNP" in the last 8760 hours. Basic Metabolic Panel: Recent Labs  Lab 02/18/22 0339 02/19/22 0317 02/20/22 0253 02/22/22 0328  NA 135 137 138 138  K 4.3 4.5 4.3 4.1  CL 103 104 104 108  CO2 _0 GLUCOSE 157* 164* 125* 113*  BUN _1 CREATININE 0.84 0.89 0.78 0.78  CALCIUM 8.5* 8.4* 8.3* 8.1*   Liver Function Tests: No results for input(s): "AST", "ALT", "ALKPHOS", "BILITOT", "PROT", "ALBUMIN" in the last 168 hours. No results for input(s): "LIPASE", "AMYLASE" in the last 168 hours. No results for input(s): "AMMONIA" in the last 168 hours. CBC: Recent Labs  Lab 02/18/22 0339 02/19/22 0317 02/20/22 0253 02/22/22 0328  WBC 10.1 8.6 11.4* 8.4  HGB 10.2* 10.1* 9.7* 9.5*  HCT 30.8* 29.3* 28.1* 28.1*  MCV 97.2 95.8 95.6 96.9  PLT 165 167 182 200   Cardiac Enzymes: No results for input(s): "CKTOTAL", "CKMB", "CKMBINDEX", "TROPONINI" in the last 168 hours. BNP: Invalid input(s): "POCBNP" CBG: Recent Labs  Lab 02/21/22 1945 02/22/22 0824 02/22/22 1137 02/22/22 1656  02/23/22 0743  GLUCAP 103* 133* 132* 142* 172*   D-Dimer No results for input(s): "DDIMER" in the last 72 hours. Hgb A1c No results for input(s): "HGBA1C" in the last 72 hours. Lipid Profile No results for input(s): "CHOL", "HDL", "LDLCALC", "TRIG", "CHOLHDL", "LDLDIRECT" in the last 72 hours. Thyroid function studies No results for input(s): "TSH", "T4TOTAL", "T3FREE", "THYROIDAB" in the last 72 hours.  Invalid input(s): "FREET3" Anemia work up No  results for input(s): "VITAMINB12", "FOLATE", "FERRITIN", "TIBC", "IRON", "RETICCTPCT" in the last 72 hours. Urinalysis    Component Value Date/Time   COLORURINE YELLOW 04/26/2014 1827   APPEARANCEUR CLOUDY (A) 04/26/2014 1827   LABSPEC >1.030 (H) 04/26/2014 1827   PHURINE 5.5 04/26/2014 1827   GLUCOSEU NEGATIVE 04/26/2014 1827   HGBUR NEGATIVE 04/26/2014 1827   BILIRUBINUR Negative 11/24/2015 1239   KETONESUR 15 (A) 04/26/2014 1827   PROTEINUR negative 11/24/2015 1239   PROTEINUR 30 (A) 04/26/2014 1827   UROBILINOGEN 0.2 11/24/2015 1239   UROBILINOGEN 1.0 04/26/2014 1827   NITRITE Negative 11/24/2015 1239   NITRITE POSITIVE (A) 04/26/2014 1827   LEUKOCYTESUR Negative 11/24/2015 1239   Sepsis Labs Recent Labs  Lab 02/18/22 0339 02/19/22 0317 02/20/22 0253 02/22/22 0328  WBC 10.1 8.6 11.4* 8.4   Microbiology Recent Results (from the past 240 hour(s))  Respiratory (~20 pathogens) panel by PCR     Status: None   Collection Time: 02/16/22  8:36 AM   Specimen: Nasopharyngeal Swab; Respiratory  Result Value Ref Range Status   Adenovirus NOT DETECTED NOT DETECTED Final   Coronavirus 229E NOT DETECTED NOT DETECTED Final    Comment: (NOTE) The Coronavirus on the Respiratory Panel, DOES NOT test for the novel  Coronavirus (2019 nCoV)    Coronavirus HKU1 NOT DETECTED NOT DETECTED Final   Coronavirus NL63 NOT DETECTED NOT DETECTED Final   Coronavirus OC43 NOT DETECTED NOT DETECTED Final   Metapneumovirus NOT DETECTED NOT  DETECTED Final   Rhinovirus / Enterovirus NOT DETECTED NOT DETECTED Final   Influenza A NOT DETECTED NOT DETECTED Final   Influenza B NOT DETECTED NOT DETECTED Final   Parainfluenza Virus 1 NOT DETECTED NOT DETECTED Final   Parainfluenza Virus 2 NOT DETECTED NOT DETECTED Final   Parainfluenza Virus 3 NOT DETECTED NOT DETECTED Final   Parainfluenza Virus 4 NOT DETECTED NOT DETECTED Final   Respiratory Syncytial Virus NOT DETECTED NOT DETECTED Final   Bordetella pertussis NOT DETECTED NOT DETECTED Final   Bordetella Parapertussis NOT DETECTED NOT DETECTED Final   Chlamydophila pneumoniae NOT DETECTED NOT DETECTED Final   Mycoplasma pneumoniae NOT DETECTED NOT DETECTED Final    Comment: Performed at [facility], [place]. [address], [place], [place] [address]  Resp panel by RT-PCR (RSV, Flu A&B, Covid) Anterior Nasal Swab     Status: None   Collection Time: 02/16/22  8:36 AM   Specimen: Anterior Nasal Swab  Result Value Ref Range Status   SARS Coronavirus 2 by RT PCR NEGATIVE NEGATIVE Final    Comment: (NOTE) SARS-CoV-2 target nucleic acids are NOT DETECTED.  The SARS-CoV-2 RNA is generally detectable in upper respiratory specimens during the acute phase of infection. The lowest concentration of SARS-CoV-2 viral copies this assay can detect is 138 copies/mL. A negative result does not preclude SARS-Cov-2 infection and should not be used as the sole basis for treatment or other patient management decisions. A negative result may occur with  improper specimen collection/handling, submission of specimen other than nasopharyngeal swab, presence of viral mutation(s) within the areas targeted by this assay, and inadequate number of viral copies(<138 copies/mL). A negative result must be combined with clinical observations, patient history, and epidemiological information. The expected result is Negative.  Fact Sheet for Patients:  EntrepreneurPulse.com.au  Fact Sheet  for Healthcare Providers:  IncredibleEmployment.be  This test is no t yet approved or cleared by the [place] FDA and  has been authorized for detection and/or diagnosis of SARS-CoV-2 by  FDA under an Emergency Use Authorization (EUA). This EUA will remain  in effect (meaning this test can be used) for the duration of the COVID-19 declaration under Section 564(b)(1) of the Act, 21 U.S.C.section 360bbb-3(b)(1), unless the authorization is terminated  or revoked sooner.       Influenza A by PCR NEGATIVE NEGATIVE Final   Influenza B by PCR NEGATIVE NEGATIVE Final    Comment: (NOTE) The Xpert Xpress SARS-CoV-2/FLU/RSV plus assay is intended as an aid in the diagnosis of influenza from Nasopharyngeal swab specimens and should not be used as a sole basis for treatment. Nasal washings and aspirates are unacceptable for Xpert Xpress SARS-CoV-2/FLU/RSV testing.  Fact Sheet for Patients: EntrepreneurPulse.com.au  Fact Sheet for Healthcare Providers: IncredibleEmployment.be  This test is not yet approved or cleared by the [place] FDA and has been authorized for detection and/or diagnosis of SARS-CoV-2 by FDA under an Emergency Use Authorization (EUA). This EUA will remain in effect (meaning this test can be used) for the duration of the COVID-19 declaration under Section 564(b)(1) of the Act, 21 U.S.C. section 360bbb-3(b)(1), unless the authorization is terminated or revoked.     Resp Syncytial Virus by PCR NEGATIVE NEGATIVE Final    Comment: (NOTE) Fact Sheet for Patients: EntrepreneurPulse.com.au  Fact Sheet for Healthcare Providers: IncredibleEmployment.be  This test is not yet approved or cleared by the [place] FDA and has been authorized for detection and/or diagnosis of SARS-CoV-2 by FDA under an Emergency Use Authorization (EUA). This EUA will remain in effect (meaning  this test can be used) for the duration of the COVID-19 declaration under Section 564(b)(1) of the Act, 21 U.S.C. section 360bbb-3(b)(1), unless the authorization is terminated or revoked.  Performed at [facility], [place] [address], [place], [place] [address]   Surgical pcr screen     Status: Abnormal   Collection Time: 02/18/22 12:13 PM   Specimen: Nasal Mucosa; Nasal Swab  Result Value Ref Range Status   MRSA, PCR NEGATIVE NEGATIVE Final   Staphylococcus aureus POSITIVE (A) NEGATIVE Final    Comment: (NOTE) The Xpert SA Assay (FDA approved for NASAL specimens in patients 91 years of age and older), is one component of a comprehensive surveillance program. It is not intended to diagnose infection nor to guide or monitor treatment. Performed at [facility], [place] [address] [place], [place] [address]      Time coordinating discharge: Over 30 minutes  SIGNED:   Darliss Cheney, MD  [facility] 02/23/2022, 9:32 AM *Please note that this is a verbal dictation therefore any spelling or grammatical errors are due to the "[place] One" system interpretation. If 7PM-7AM, please contact night-coverage [email or web address]

## 2022-02-23 NOTE — Care Management Important Message (Signed)
Important Message  Patient Details  Name: Cindy Whitehead MRN: [id number] Date of Birth: Sep 05, 1935   Medicare Important Message Given:  Yes     Hannah Beat 02/23/2022, 3:13 PM

## 2022-02-23 NOTE — Progress Notes (Signed)
Patient admitting to LTC facility

## 2022-02-23 NOTE — TOC Transition Note (Signed)
Transition of Care [facility]) - CM/SW Discharge Note   Patient Details  Name: ITA FRITZSCHE MRN: [id number] Date of Birth: 04/11/1935  [facility] CM/SW Contact:  Joanne Chars, LCSW Phone Number: 02/23/2022, 10:48 AM   Clinical Narrative:   Pt discharging to [facility], room 116.  RN call report to [phone].   Daughter will transport pt and will need assistance getting pt into her car at the main entrance.   Final next level of care: Skilled Nursing Facility Barriers to Discharge: Barriers Resolved   Patient Goals and CMS Choice Patient states their goals for this hospitalization and ongoing recovery are:: SNF CMS Medicare.gov Compare Post Acute Care list provided to:: Patient Choice offered to / list presented to : Patient    Discharge Placement              Patient chooses bed at:  [facility]) Patient to be transferred to facility by: daughter Arrie Aran Name of family member notified: daughter Arrie Aran in room Patient and family notified of of transfer: 02/23/22  Discharge Plan and Services In-house Referral: Clinical Social Work                                   Social Determinants of Health ([place]) Interventions     Readmission Risk Interventions     No data to display

## 2022-02-23 NOTE — TOC Progression Note (Addendum)
Transition of Care [facility]) - Progression Note    Patient Details  Name: Cindy Whitehead MRN: [id number] Date of Birth: Mar 08, 1935  Transition of Care [facility]) CM/SW Contact  Joanne Chars, LCSW Phone Number: 02/23/2022, 8:54 AM  Clinical Narrative:   SNF auth approved: [id number], [id number], 3 days: 12/19-12/21.  MD informed.  1000: CSW confirmed with [facility]/[place] that they can receive today.  Informed that daughter will transport and will need assistance upon arrival.   CSW spoke with daughter Arrie Aran, who is at the hospital and ready.    Expected Discharge Plan: [place] Barriers to Discharge: Continued Medical Work up  Expected Discharge Plan and Services Expected Discharge Plan: [place] In-house Referral: Clinical Social Work     Living arrangements for the past 2 months: Single Family Home                                       Social Determinants of Health (SDOH) Interventions    Readmission Risk Interventions     No data to display

## 2022-02-23 NOTE — Progress Notes (Signed)
Pt discharged to home. DC instructions given to daughter Bufford Spikes) at bedside. No concerns voiced. Pt left unit in wheelchair pushed by nurse techs Blanch Media and female NT) accompanied by daughter. Left in stable condition.

## 2022-02-25 ENCOUNTER — Encounter: Payer: Self-pay | Admitting: Student

## 2022-02-25 ENCOUNTER — Non-Acute Institutional Stay (SKILLED_NURSING_FACILITY): Payer: Medicare Other | Admitting: Student

## 2022-02-25 DIAGNOSIS — E1169 Type 2 diabetes mellitus with other specified complication: Secondary | ICD-10-CM | POA: Diagnosis not present

## 2022-02-25 DIAGNOSIS — S42202A Unspecified fracture of upper end of left humerus, initial encounter for closed fracture: Secondary | ICD-10-CM | POA: Diagnosis not present

## 2022-02-25 DIAGNOSIS — E785 Hyperlipidemia, unspecified: Secondary | ICD-10-CM

## 2022-02-25 DIAGNOSIS — S42291A Other displaced fracture of upper end of right humerus, initial encounter for closed fracture: Secondary | ICD-10-CM

## 2022-02-25 DIAGNOSIS — E119 Type 2 diabetes mellitus without complications: Secondary | ICD-10-CM

## 2022-02-25 DIAGNOSIS — I10 Essential (primary) hypertension: Secondary | ICD-10-CM | POA: Diagnosis not present
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
## 2022-03-02 ENCOUNTER — Other Ambulatory Visit: Payer: Self-pay | Admitting: Student

## 2022-03-02 DIAGNOSIS — S42202A Unspecified fracture of upper end of left humerus, initial encounter for closed fracture: Secondary | ICD-10-CM

## 2022-03-02 DIAGNOSIS — S42291A Other displaced fracture of upper end of right humerus, initial encounter for closed fracture: Secondary | ICD-10-CM

## 2022-03-02 MED ORDER — OXYCODONE-ACETAMINOPHEN 5-325 MG PO TABS: 1.0000 | ORAL_TABLET | Freq: Four times a day (QID) | ORAL | 0 refills | Status: DC | PRN

## 2022-03-02 NOTE — Progress Notes (Signed)
Refill for pain control.

## 2022-03-04 DIAGNOSIS — Z4789 Encounter for other orthopedic aftercare: Secondary | ICD-10-CM | POA: Diagnosis not present

## 2022-03-04 DIAGNOSIS — S42202A Unspecified fracture of upper end of left humerus, initial encounter for closed fracture: Secondary | ICD-10-CM | POA: Diagnosis not present

## 2022-03-07 DIAGNOSIS — S42202D Unspecified fracture of upper end of left humerus, subsequent encounter for fracture with routine healing: Secondary | ICD-10-CM | POA: Diagnosis not present

## 2022-03-07 DIAGNOSIS — I10 Essential (primary) hypertension: Secondary | ICD-10-CM | POA: Diagnosis not present

## 2022-03-07 DIAGNOSIS — Z9181 History of falling: Secondary | ICD-10-CM | POA: Diagnosis not present

## 2022-03-07 DIAGNOSIS — Z741 Need for assistance with personal care: Secondary | ICD-10-CM | POA: Diagnosis not present

## 2022-03-07 DIAGNOSIS — Z96611 Presence of right artificial shoulder joint: Secondary | ICD-10-CM | POA: Diagnosis not present

## 2022-03-07 DIAGNOSIS — S42291A Other displaced fracture of upper end of right humerus, initial encounter for closed fracture: Secondary | ICD-10-CM | POA: Diagnosis not present

## 2022-03-07 DIAGNOSIS — R278 Other lack of coordination: Secondary | ICD-10-CM | POA: Diagnosis not present

## 2022-03-07 DIAGNOSIS — S42201D Unspecified fracture of upper end of right humerus, subsequent encounter for fracture with routine healing: Secondary | ICD-10-CM | POA: Diagnosis not present

## 2022-03-07 DIAGNOSIS — E119 Type 2 diabetes mellitus without complications: Secondary | ICD-10-CM | POA: Diagnosis not present

## 2022-03-07 DIAGNOSIS — F5101 Primary insomnia: Secondary | ICD-10-CM | POA: Diagnosis not present

## 2022-03-07 DIAGNOSIS — E785 Hyperlipidemia, unspecified: Secondary | ICD-10-CM | POA: Diagnosis not present

## 2022-03-07 DIAGNOSIS — R2689 Other abnormalities of gait and mobility: Secondary | ICD-10-CM | POA: Diagnosis not present

## 2022-03-07 DIAGNOSIS — S42202A Unspecified fracture of upper end of left humerus, initial encounter for closed fracture: Secondary | ICD-10-CM | POA: Diagnosis not present

## 2022-03-10 ENCOUNTER — Non-Acute Institutional Stay (SKILLED_NURSING_FACILITY): Payer: Medicare Other | Admitting: Nurse Practitioner

## 2022-03-10 ENCOUNTER — Encounter: Payer: Self-pay | Admitting: Nurse Practitioner

## 2022-03-10 DIAGNOSIS — N3281 Overactive bladder: Secondary | ICD-10-CM

## 2022-03-10 DIAGNOSIS — I10 Essential (primary) hypertension: Secondary | ICD-10-CM

## 2022-03-10 DIAGNOSIS — S42291A Other displaced fracture of upper end of right humerus, initial encounter for closed fracture: Secondary | ICD-10-CM | POA: Diagnosis not present

## 2022-03-10 DIAGNOSIS — F5101 Primary insomnia: Secondary | ICD-10-CM

## 2022-03-10 DIAGNOSIS — E119 Type 2 diabetes mellitus without complications: Secondary | ICD-10-CM

## 2022-03-10 DIAGNOSIS — S42202A Unspecified fracture of upper end of left humerus, initial encounter for closed fracture: Secondary | ICD-10-CM

## 2022-03-10 MED ORDER — GEMTESA 75 MG PO TABS: 75.0000 mg | ORAL_TABLET | Freq: Every day | ORAL | 0 refills | Status: DC
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
## 2022-03-11 ENCOUNTER — Other Ambulatory Visit: Payer: Self-pay | Admitting: Nurse Practitioner

## 2022-03-11 ENCOUNTER — Other Ambulatory Visit: Payer: Self-pay | Admitting: Student

## 2022-03-11 DIAGNOSIS — S42291A Other displaced fracture of upper end of right humerus, initial encounter for closed fracture: Secondary | ICD-10-CM

## 2022-03-11 DIAGNOSIS — S42202A Unspecified fracture of upper end of left humerus, initial encounter for closed fracture: Secondary | ICD-10-CM

## 2022-03-11 MED ORDER — OXYCODONE-ACETAMINOPHEN 5-325 MG PO TABS: 1.0000 | ORAL_TABLET | Freq: Four times a day (QID) | ORAL | 0 refills | Status: AC | PRN

## 2022-03-11 NOTE — Telephone Encounter (Signed)
Patient is requesting refill on Oxycodone. This is not your patient. Medication pend and sent to PCP Tower, Wynelle Fanny, MD

## 2022-03-14 ENCOUNTER — Encounter: Payer: Self-pay | Admitting: Student

## 2022-03-14 ENCOUNTER — Non-Acute Institutional Stay (SKILLED_NURSING_FACILITY): Payer: Medicare Other | Admitting: Student

## 2022-03-14 DIAGNOSIS — S42202D Unspecified fracture of upper end of left humerus, subsequent encounter for fracture with routine healing: Secondary | ICD-10-CM | POA: Diagnosis not present

## 2022-03-14 DIAGNOSIS — I10 Essential (primary) hypertension: Secondary | ICD-10-CM

## 2022-03-14 DIAGNOSIS — Z96611 Presence of right artificial shoulder joint: Secondary | ICD-10-CM | POA: Diagnosis not present

## 2022-03-14 DIAGNOSIS — S42201D Unspecified fracture of upper end of right humerus, subsequent encounter for fracture with routine healing: Secondary | ICD-10-CM | POA: Diagnosis not present
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
## 2022-03-15 ENCOUNTER — Telehealth: Payer: Self-pay | Admitting: Family Medicine

## 2022-03-15 NOTE — Telephone Encounter (Signed)
Patient daughter Cindy Whitehead sent this message over today in regards to her mom appointment on Friday. She stated that they can do a virtual appointment if that is fine. Please advise. Thank you!   "My Mom is coming home from rehab tomorrow (Wednesday), and can barely walk.  She will have home health care coming out to the house.  I'm not sure I can get her back out of the house and there for an appointment or not. Please Advise."

## 2022-03-15 NOTE — Telephone Encounter (Signed)
Please ask how she is  Home care can let me know if vitals are nor stable I would rather wait and do a visit in person= perhaps in 1-2 weeks she will be up to it? (Hope she will get stronger)

## 2022-03-15 NOTE — Telephone Encounter (Signed)
See prev note appt is for a f/u

## 2022-03-16 NOTE — Telephone Encounter (Signed)
Daughter notified of Dr. Tower's comments and verbalized understanding  

## 2022-03-16 NOTE — Telephone Encounter (Signed)
Spoke with patient daughter Arrie Aran and reschedule the appointment. She stated that she is coming home today. She stated that her blood pressure has been high since she has been up there but they stated it may be due to the pain. She just wanted to make you aware. Thank you!

## 2022-03-16 NOTE — Telephone Encounter (Signed)
Keep checking it  Let me know how it is on Friday if they can

## 2022-03-17 ENCOUNTER — Telehealth: Payer: Self-pay | Admitting: Family Medicine

## 2022-03-17 DIAGNOSIS — N3281 Overactive bladder: Secondary | ICD-10-CM

## 2022-03-17 NOTE — Telephone Encounter (Signed)
Prescription Request  03/17/2022  Is this a "Controlled Substance" medicine? No  LOV: 12/07/2021  What is the name of the medication or equipment? simvastatin (ZOCOR) 20 MG tablet   Vibegron (GEMTESA) 75 MG TABS   methocarbamol (ROBAXIN) 500 MG tablet   oxyCODONE-acetaminophen (PERCOCET) 5-325 MG tablet   Metformin and Incontinence medication   Have you contacted your pharmacy to request a refill? Yes   Which pharmacy would you like this sent to?  [id number] [address]  [id number]: [phone]: [phone] Patient notified that their request is being sent to the clinical staff for review and that they should receive a response within 2 business days.   Please advise at Mobile [phone](mobile)   Comment: Patient daughter DArrie Aranstated that she is completely out of the Simvastatin and will need to take one tonight. She stated that she has 2 Vibegron left and will need that by the weekend. The other medication she stated she has about 7-8 pills left for. She isn't sure if she need the Metformin or Incontinence medication because she hasn't been taking them while in the facility.

## 2022-03-17 NOTE — Telephone Encounter (Signed)
Patient daughter Arrie Aran called in and stated that [facility] is coming out to the house on Tuesday. She stated that she needs skilled nursing instead because she has to work. She was wanting to know if a referral or something could be put in for [facility] skilled nursing. Please advise. Thank you!

## 2022-03-18 ENCOUNTER — Ambulatory Visit: Payer: Medicare Other | Admitting: Family Medicine

## 2022-03-18 MED ORDER — GEMTESA 75 MG PO TABS: 75.0000 mg | ORAL_TABLET | Freq: Every day | ORAL | 0 refills | Status: DC

## 2022-03-18 MED ORDER — METFORMIN HCL 500 MG PO TABS: 250.0000 mg | ORAL_TABLET | Freq: Two times a day (BID) | ORAL | 0 refills | Status: DC

## 2022-03-18 NOTE — Telephone Encounter (Signed)
Please ask what her skilled nursing needs are so I can include in the referral and code appropriately  What company?   Thanks , let me know and I will do it

## 2022-03-18 NOTE — Telephone Encounter (Signed)
Name of Medication: Percocet  Name of [facility]acy: [facility] or Written Date and Quantity: 03/11/22 #20 tab/ 0 refill Last Office Visit and Type: 12/07/21 CPE Next Office Visit and Type: 03/28/21 f/u Last Controlled Substance Agreement Date: n/a Last UDS:n/a    Simvastatin not due filled on 12/07/21 #45 tabs/ 3 refills (pt just needs to call pharmacy)  Vibegron and robaxin on med list as historical entry from Sherrie Mustache, NP  And Metformin and Incontinence medication  not on med list, please advise

## 2022-03-18 NOTE — Telephone Encounter (Signed)
She wasn't sure what skilled nursing needs pt will need. I advised her usually at the eval pt is having on 03/22/22 that is when they will eval her for PT and also see any other needs pt might need. Daughter said she will talk to PT on Tuesday and if she needs anything else she will let us know if not she will discuss this directly with them

## 2022-03-18 NOTE — Telephone Encounter (Signed)
Ok, I will wait to hear before I order Thanks

## 2022-03-18 NOTE — Telephone Encounter (Signed)
She is taking gemtesa instead of oxyburinin (for overactive bladder)   I sent in metformin   She will need to check in with ortho about the methocarbamol (muscle relaxer) Unsure if they want to mix that with the pain med   We will rev everything when she can get in for her post hosp visit eventually

## 2022-03-18 NOTE — Telephone Encounter (Signed)
I reviewed her report The percocet is for post op pan and needs to come from her orthopedic doctor   Dr Mancel Bale was her surgeon and it looks like he did her last refill per the state web site

## 2022-03-18 NOTE — Telephone Encounter (Signed)
Spoke with daughter she will called ortho regarding pain med and [facility].  She is still asking for refills of the other meds mentioned in prev message Vibegron and robaxin metformin and incontinence med.  I asked her about metformin and incontinence med. Pt said she thought PCP d/c her from metformin but rehab has been giving her metformin so she isn't sure if pt needs to keep taking med or not. Pt will keep taking metformin until she gets a response from PCP. Also she said pt was on oxybutynin 10 mg at one point but rehab hasn't been giving it to her so not sure if she is suppose to still be on that or not but if so needs a refill of that also

## 2022-03-18 NOTE — Addendum Note (Signed)
Addended by: Loura Pardon A on: 03/18/2022 05:25 PM   Modules accepted: Orders

## 2022-03-22 ENCOUNTER — Telehealth: Payer: Self-pay | Admitting: Family Medicine

## 2022-03-22 DIAGNOSIS — Z7984 Long term (current) use of oral hypoglycemic drugs: Secondary | ICD-10-CM | POA: Diagnosis not present

## 2022-03-22 DIAGNOSIS — Z87442 Personal history of urinary calculi: Secondary | ICD-10-CM | POA: Diagnosis not present

## 2022-03-22 DIAGNOSIS — I10 Essential (primary) hypertension: Secondary | ICD-10-CM | POA: Diagnosis not present

## 2022-03-22 DIAGNOSIS — Z6834 Body mass index (BMI) 34.0-34.9, adult: Secondary | ICD-10-CM | POA: Diagnosis not present

## 2022-03-22 DIAGNOSIS — F4323 Adjustment disorder with mixed anxiety and depressed mood: Secondary | ICD-10-CM | POA: Diagnosis not present

## 2022-03-22 DIAGNOSIS — Z6838 Body mass index (BMI) 38.0-38.9, adult: Secondary | ICD-10-CM | POA: Diagnosis not present

## 2022-03-22 DIAGNOSIS — J309 Allergic rhinitis, unspecified: Secondary | ICD-10-CM | POA: Diagnosis not present

## 2022-03-22 DIAGNOSIS — N3281 Overactive bladder: Secondary | ICD-10-CM | POA: Diagnosis not present

## 2022-03-22 DIAGNOSIS — E1169 Type 2 diabetes mellitus with other specified complication: Secondary | ICD-10-CM | POA: Diagnosis not present

## 2022-03-22 DIAGNOSIS — S42202D Unspecified fracture of upper end of left humerus, subsequent encounter for fracture with routine healing: Secondary | ICD-10-CM | POA: Diagnosis not present

## 2022-03-22 DIAGNOSIS — Z87440 Personal history of urinary (tract) infections: Secondary | ICD-10-CM | POA: Diagnosis not present

## 2022-03-22 DIAGNOSIS — S42291D Other displaced fracture of upper end of right humerus, subsequent encounter for fracture with routine healing: Secondary | ICD-10-CM | POA: Diagnosis not present

## 2022-03-22 DIAGNOSIS — M545 Low back pain, unspecified: Secondary | ICD-10-CM | POA: Diagnosis not present

## 2022-03-22 DIAGNOSIS — J45909 Unspecified asthma, uncomplicated: Secondary | ICD-10-CM | POA: Diagnosis not present

## 2022-03-22 DIAGNOSIS — G8929 Other chronic pain: Secondary | ICD-10-CM | POA: Diagnosis not present

## 2022-03-22 DIAGNOSIS — Z9181 History of falling: Secondary | ICD-10-CM | POA: Diagnosis not present

## 2022-03-22 DIAGNOSIS — K219 Gastro-esophageal reflux disease without esophagitis: Secondary | ICD-10-CM | POA: Diagnosis not present

## 2022-03-22 DIAGNOSIS — E785 Hyperlipidemia, unspecified: Secondary | ICD-10-CM | POA: Diagnosis not present

## 2022-03-22 DIAGNOSIS — S42201D Unspecified fracture of upper end of right humerus, subsequent encounter for fracture with routine healing: Secondary | ICD-10-CM | POA: Diagnosis not present

## 2022-03-22 DIAGNOSIS — R69 Illness, unspecified: Secondary | ICD-10-CM | POA: Diagnosis not present

## 2022-03-22 DIAGNOSIS — F5101 Primary insomnia: Secondary | ICD-10-CM | POA: Diagnosis not present

## 2022-03-22 DIAGNOSIS — Z96611 Presence of right artificial shoulder joint: Secondary | ICD-10-CM | POA: Diagnosis not present

## 2022-03-22 NOTE — Telephone Encounter (Signed)
F/u scheduled for Monday

## 2022-03-22 NOTE — Telephone Encounter (Signed)
Left VM giving Kelly VO

## 2022-03-22 NOTE — Telephone Encounter (Signed)
Please ok those vergal orders

## 2022-03-22 NOTE — Telephone Encounter (Signed)
Home Health verbal orders [place] Name: Cindy Whitehead number: [phone]  Requesting PT  Frequency: 1x a week for 9 wks  Please forward to [facility] pool or providers CMA

## 2022-03-28 ENCOUNTER — Encounter: Payer: Self-pay | Admitting: Family Medicine

## 2022-03-28 ENCOUNTER — Ambulatory Visit (INDEPENDENT_AMBULATORY_CARE_PROVIDER_SITE_OTHER): Payer: Medicare HMO | Admitting: Family Medicine

## 2022-03-28 VITALS — BP 132/78 | HR 74 | Temp 97.6°F | Ht 60.0 in | Wt 169.4 lb

## 2022-03-28 DIAGNOSIS — E1169 Type 2 diabetes mellitus with other specified complication: Secondary | ICD-10-CM | POA: Diagnosis not present

## 2022-03-28 DIAGNOSIS — E785 Hyperlipidemia, unspecified: Secondary | ICD-10-CM | POA: Diagnosis not present

## 2022-03-28 DIAGNOSIS — Z87440 Personal history of urinary (tract) infections: Secondary | ICD-10-CM | POA: Diagnosis not present

## 2022-03-28 DIAGNOSIS — S42291A Other displaced fracture of upper end of right humerus, initial encounter for closed fracture: Secondary | ICD-10-CM | POA: Diagnosis not present

## 2022-03-28 DIAGNOSIS — W19XXXS Unspecified fall, sequela: Secondary | ICD-10-CM | POA: Diagnosis not present

## 2022-03-28 DIAGNOSIS — K219 Gastro-esophageal reflux disease without esophagitis: Secondary | ICD-10-CM | POA: Diagnosis not present

## 2022-03-28 DIAGNOSIS — D508 Other iron deficiency anemias: Secondary | ICD-10-CM

## 2022-03-28 DIAGNOSIS — E559 Vitamin D deficiency, unspecified: Secondary | ICD-10-CM

## 2022-03-28 DIAGNOSIS — Z6833 Body mass index (BMI) 33.0-33.9, adult: Secondary | ICD-10-CM

## 2022-03-28 DIAGNOSIS — Y92009 Unspecified place in unspecified non-institutional (private) residence as the place of occurrence of the external cause: Secondary | ICD-10-CM | POA: Diagnosis not present

## 2022-03-28 DIAGNOSIS — Z6834 Body mass index (BMI) 34.0-34.9, adult: Secondary | ICD-10-CM | POA: Diagnosis not present

## 2022-03-28 DIAGNOSIS — E119 Type 2 diabetes mellitus without complications: Secondary | ICD-10-CM | POA: Diagnosis not present

## 2022-03-28 DIAGNOSIS — J45909 Unspecified asthma, uncomplicated: Secondary | ICD-10-CM | POA: Diagnosis not present

## 2022-03-28 DIAGNOSIS — F5101 Primary insomnia: Secondary | ICD-10-CM | POA: Diagnosis not present

## 2022-03-28 DIAGNOSIS — I10 Essential (primary) hypertension: Secondary | ICD-10-CM

## 2022-03-28 DIAGNOSIS — M545 Low back pain, unspecified: Secondary | ICD-10-CM | POA: Diagnosis not present

## 2022-03-28 DIAGNOSIS — M8589 Other specified disorders of bone density and structure, multiple sites: Secondary | ICD-10-CM

## 2022-03-28 DIAGNOSIS — Z9181 History of falling: Secondary | ICD-10-CM | POA: Diagnosis not present

## 2022-03-28 DIAGNOSIS — Z7984 Long term (current) use of oral hypoglycemic drugs: Secondary | ICD-10-CM | POA: Diagnosis not present

## 2022-03-28 DIAGNOSIS — F4323 Adjustment disorder with mixed anxiety and depressed mood: Secondary | ICD-10-CM

## 2022-03-28 DIAGNOSIS — D649 Anemia, unspecified: Secondary | ICD-10-CM | POA: Insufficient documentation

## 2022-03-28 DIAGNOSIS — S42202A Unspecified fracture of upper end of left humerus, initial encounter for closed fracture: Secondary | ICD-10-CM

## 2022-03-28 DIAGNOSIS — E6609 Other obesity due to excess calories: Secondary | ICD-10-CM | POA: Diagnosis not present

## 2022-03-28 DIAGNOSIS — N3281 Overactive bladder: Secondary | ICD-10-CM | POA: Diagnosis not present

## 2022-03-28 DIAGNOSIS — J309 Allergic rhinitis, unspecified: Secondary | ICD-10-CM | POA: Diagnosis not present

## 2022-03-28 DIAGNOSIS — R69 Illness, unspecified: Secondary | ICD-10-CM | POA: Diagnosis not present

## 2022-03-28 DIAGNOSIS — S42202D Unspecified fracture of upper end of left humerus, subsequent encounter for fracture with routine healing: Secondary | ICD-10-CM | POA: Diagnosis not present

## 2022-03-28 DIAGNOSIS — S42201D Unspecified fracture of upper end of right humerus, subsequent encounter for fracture with routine healing: Secondary | ICD-10-CM | POA: Diagnosis not present

## 2022-03-28 DIAGNOSIS — Z96611 Presence of right artificial shoulder joint: Secondary | ICD-10-CM | POA: Diagnosis not present

## 2022-03-28 MED ORDER — TRAZODONE HCL 50 MG PO TABS: ORAL_TABLET | ORAL | 3 refills | Status: DC

## 2022-03-28 NOTE — Assessment & Plan Note (Signed)
Bilat humerous fractures with surgery on R Reviewed hospital records, lab results and studies in detail  Overall doing well after stay in snf for rehab Has [place] now and doing some PT  Sees ortho for f/u on Thursday  Still takes oxycodone once daily for pain / will ask about pain control at that visit  Also muscle relaxer at bedtime   Doing well with walker Lab today

## 2022-03-28 NOTE — Assessment & Plan Note (Signed)
Suspect post op with shoulder surg  Hb was 9.5  Re check today Not taking iron

## 2022-03-28 NOTE — Assessment & Plan Note (Signed)
Resulting in bilat fractured humerus bones and surgery on the R Reviewed hospital records, lab results and studies in detail   Getting [facility] PT/ OT after snf stay  Doing well with walker  Disc fall prevention /risk in detail today

## 2022-03-28 NOTE — Assessment & Plan Note (Signed)
Pt has had bilateral humerous fx in a fall   (not fragility fractures) Gyn sees her for osteopenia  Took fosamax years ago- ? If candidate for another course  She will plan f/u with gyn   Enc her strongly to get back on vit D 4000 iu daily  Also mvi and high ca foods

## 2022-03-28 NOTE — Assessment & Plan Note (Signed)
Sleep issues after bilat humerus fx Did take trazodone in snf  Refilled this and hope it will help at home

## 2022-03-28 NOTE — Patient Instructions (Addendum)
Keep eating regular meals  Get protein with every meal  Some meat    Get back on vitamin D3  4000 iu daily (units)  One multi vitamin daily   This is important for bone health  Talk to your gyn provider about your osteopenia and treatment for fractures    Try the trazodone again for sleep  Take 1/2 to 1 pill at bedtime  If it makes you dizzy or feeling funny stop it    Labs today   See orthopedics as planned   Take care of yourself

## 2022-03-28 NOTE — Assessment & Plan Note (Signed)
Bilat humerous fractures with surgery on R Reviewed hospital records, lab results and studies in detail  Overall doing well after stay in snf for rehab Has [place] now and doing some PT  Sees ortho for f/u on Thursday  Still takes oxycodone once daily for pain / will ask about pain control at that visit  Also muscle relaxer at bedtime   Doing well with walker Lab today
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
## 2022-03-28 NOTE — Assessment & Plan Note (Signed)
S/p hosp for bilat humerus fx with surgery  Appetite down but is eating  Was put back on metformin in snf for rehab and continues it  Based on labs today will decide whether to continue it   Lab today  On a statin  Off ace-bp is down

## 2022-03-28 NOTE — Assessment & Plan Note (Signed)
10 lb wt loss noted after hosp for broken arms Getting appetite back  Enc a healthy low glycemic diet  Doing PT for exercise

## 2022-03-28 NOTE — Assessment & Plan Note (Signed)
Pt needed bp medicine briefly in SNF (rehab) after arm fractures  Bp may have been up due to pain   BP: 132/78  Now well controlled w/o medication  Will continue to monitor

## 2022-03-28 NOTE — Assessment & Plan Note (Signed)
Pt never got back on D after her last low level  Enc her to start vit D3  4000 iu daily along with mvi  2 recent fractures   Disc imp to bone and overall health

## 2022-03-29 ENCOUNTER — Telehealth: Payer: Self-pay | Admitting: Family Medicine

## 2022-03-29 LAB — CBC WITH DIFFERENTIAL/PLATELET
Basophils Absolute: 0.1 10*3/uL (ref 0.0–0.1)
Basophils Relative: 1 % (ref 0.0–3.0)
Eosinophils Absolute: 0.2 10*3/uL (ref 0.0–0.7)
Eosinophils Relative: 2.3 % (ref 0.0–5.0)
HCT: 40.4 % (ref 36.0–46.0)
Hemoglobin: 13.5 g/dL (ref 12.0–15.0)
Lymphocytes Relative: 28.3 % (ref 12.0–46.0)
Lymphs Abs: 2.3 10*3/uL (ref 0.7–4.0)
MCHC: 33.4 g/dL (ref 30.0–36.0)
MCV: 97.4 fl (ref 78.0–100.0)
Monocytes Absolute: 0.4 10*3/uL (ref 0.1–1.0)
Monocytes Relative: 4.4 % (ref 3.0–12.0)
Neutro Abs: 5.1 10*3/uL (ref 1.4–7.7)
Neutrophils Relative %: 64 % (ref 43.0–77.0)
Platelets: 215 10*3/uL (ref 150.0–400.0)
RBC: 4.14 Mil/uL (ref 3.87–5.11)
RDW: 13.3 % (ref 11.5–15.5)
WBC: 8 10*3/uL (ref 4.0–10.5)

## 2022-03-29 LAB — COMPREHENSIVE METABOLIC PANEL
ALT: 16 U/L (ref 0–35)
AST: 22 U/L (ref 0–37)
Albumin: 3.6 g/dL (ref 3.5–5.2)
Alkaline Phosphatase: 110 U/L (ref 39–117)
BUN: 15 mg/dL (ref 6–23)
CO2: 27 mEq/L (ref 19–32)
Calcium: 9.3 mg/dL (ref 8.4–10.5)
Chloride: 105 mEq/L (ref 96–112)
Creatinine, Ser: 0.71 mg/dL (ref 0.40–1.20)
GFR: 78.08 mL/min (ref >60.00)
Glucose, Bld: 109 mg/dL — ABNORMAL HIGH (ref 70–99)
Potassium: 4.7 mEq/L (ref 3.5–5.1)
Sodium: 141 mEq/L (ref 135–145)
Total Bilirubin: 0.5 mg/dL (ref 0.2–1.2)
Total Protein: 6.6 g/dL (ref 6.0–8.3)

## 2022-03-29 LAB — IRON: Iron: 66 ug/dL (ref 42–145)

## 2022-03-29 LAB — HEMOGLOBIN A1C: Hgb A1c MFr Bld: 5.2 % (ref 4.6–6.5)

## 2022-03-29 NOTE — Telephone Encounter (Signed)
Please ok those verbal orders  

## 2022-03-29 NOTE — Telephone Encounter (Signed)
VO given to [place]

## 2022-03-29 NOTE — Telephone Encounter (Signed)
Home Health verbal orders [place] Name:Connie  Agency Name: [facility] number: [phone]  Requesting OT/PT/Skilled nursing/Social Work/Speech:OT  Reason:  Frequency:twice a week for 4 weeks, once a week 4 weeks  Please forward to [facility] pool or providers CMA

## 2022-03-30 ENCOUNTER — Other Ambulatory Visit: Payer: Self-pay | Admitting: Family Medicine

## 2022-03-31 DIAGNOSIS — E785 Hyperlipidemia, unspecified: Secondary | ICD-10-CM | POA: Diagnosis not present

## 2022-03-31 DIAGNOSIS — Z4789 Encounter for other orthopedic aftercare: Secondary | ICD-10-CM | POA: Diagnosis not present

## 2022-03-31 DIAGNOSIS — F5101 Primary insomnia: Secondary | ICD-10-CM | POA: Diagnosis not present

## 2022-03-31 DIAGNOSIS — J309 Allergic rhinitis, unspecified: Secondary | ICD-10-CM | POA: Diagnosis not present

## 2022-03-31 DIAGNOSIS — Z7984 Long term (current) use of oral hypoglycemic drugs: Secondary | ICD-10-CM | POA: Diagnosis not present

## 2022-03-31 DIAGNOSIS — S42202D Unspecified fracture of upper end of left humerus, subsequent encounter for fracture with routine healing: Secondary | ICD-10-CM | POA: Diagnosis not present

## 2022-03-31 DIAGNOSIS — Z9181 History of falling: Secondary | ICD-10-CM | POA: Diagnosis not present

## 2022-03-31 DIAGNOSIS — S42202A Unspecified fracture of upper end of left humerus, initial encounter for closed fracture: Secondary | ICD-10-CM | POA: Diagnosis not present

## 2022-03-31 DIAGNOSIS — M545 Low back pain, unspecified: Secondary | ICD-10-CM | POA: Diagnosis not present

## 2022-03-31 DIAGNOSIS — K219 Gastro-esophageal reflux disease without esophagitis: Secondary | ICD-10-CM | POA: Diagnosis not present

## 2022-03-31 DIAGNOSIS — S42201D Unspecified fracture of upper end of right humerus, subsequent encounter for fracture with routine healing: Secondary | ICD-10-CM | POA: Diagnosis not present

## 2022-03-31 DIAGNOSIS — N3281 Overactive bladder: Secondary | ICD-10-CM | POA: Diagnosis not present

## 2022-03-31 DIAGNOSIS — Z6834 Body mass index (BMI) 34.0-34.9, adult: Secondary | ICD-10-CM | POA: Diagnosis not present

## 2022-03-31 DIAGNOSIS — E1169 Type 2 diabetes mellitus with other specified complication: Secondary | ICD-10-CM | POA: Diagnosis not present

## 2022-03-31 DIAGNOSIS — R69 Illness, unspecified: Secondary | ICD-10-CM | POA: Diagnosis not present

## 2022-03-31 DIAGNOSIS — J45909 Unspecified asthma, uncomplicated: Secondary | ICD-10-CM | POA: Diagnosis not present

## 2022-03-31 DIAGNOSIS — I10 Essential (primary) hypertension: Secondary | ICD-10-CM | POA: Diagnosis not present

## 2022-03-31 DIAGNOSIS — Z96611 Presence of right artificial shoulder joint: Secondary | ICD-10-CM | POA: Diagnosis not present

## 2022-03-31 DIAGNOSIS — Z87440 Personal history of urinary (tract) infections: Secondary | ICD-10-CM | POA: Diagnosis not present

## 2022-03-31 NOTE — Telephone Encounter (Signed)
Not on med list because hospital d/c med from list but PCP filled med last on 12/07/21 #60 tabs with 3 refills, pt just had hospital f/u on 03/28/22, please advise

## 2022-04-01 NOTE — Telephone Encounter (Signed)
This medicine gets more risky with age, can affect heart, bp, renal function and cause GI bleeding  Would rather avoid this class of med unless abs necessary

## 2022-04-01 NOTE — Telephone Encounter (Signed)
Spoke with pt's daughter and it may have been an auto refill, she doesn't need med refilled, Rx declined

## 2022-04-05 DIAGNOSIS — Z87440 Personal history of urinary (tract) infections: Secondary | ICD-10-CM | POA: Diagnosis not present

## 2022-04-05 DIAGNOSIS — K219 Gastro-esophageal reflux disease without esophagitis: Secondary | ICD-10-CM | POA: Diagnosis not present

## 2022-04-05 DIAGNOSIS — Z6834 Body mass index (BMI) 34.0-34.9, adult: Secondary | ICD-10-CM | POA: Diagnosis not present

## 2022-04-05 DIAGNOSIS — Z96611 Presence of right artificial shoulder joint: Secondary | ICD-10-CM | POA: Diagnosis not present

## 2022-04-05 DIAGNOSIS — S42201D Unspecified fracture of upper end of right humerus, subsequent encounter for fracture with routine healing: Secondary | ICD-10-CM | POA: Diagnosis not present

## 2022-04-05 DIAGNOSIS — S42202D Unspecified fracture of upper end of left humerus, subsequent encounter for fracture with routine healing: Secondary | ICD-10-CM | POA: Diagnosis not present

## 2022-04-05 DIAGNOSIS — R69 Illness, unspecified: Secondary | ICD-10-CM | POA: Diagnosis not present

## 2022-04-05 DIAGNOSIS — J309 Allergic rhinitis, unspecified: Secondary | ICD-10-CM | POA: Diagnosis not present

## 2022-04-05 DIAGNOSIS — Z9181 History of falling: Secondary | ICD-10-CM | POA: Diagnosis not present

## 2022-04-05 DIAGNOSIS — E785 Hyperlipidemia, unspecified: Secondary | ICD-10-CM | POA: Diagnosis not present

## 2022-04-05 DIAGNOSIS — E1169 Type 2 diabetes mellitus with other specified complication: Secondary | ICD-10-CM | POA: Diagnosis not present

## 2022-04-05 DIAGNOSIS — I10 Essential (primary) hypertension: Secondary | ICD-10-CM | POA: Diagnosis not present

## 2022-04-05 DIAGNOSIS — N3281 Overactive bladder: Secondary | ICD-10-CM | POA: Diagnosis not present

## 2022-04-05 DIAGNOSIS — Z7984 Long term (current) use of oral hypoglycemic drugs: Secondary | ICD-10-CM | POA: Diagnosis not present

## 2022-04-05 DIAGNOSIS — F5101 Primary insomnia: Secondary | ICD-10-CM | POA: Diagnosis not present

## 2022-04-05 DIAGNOSIS — M545 Low back pain, unspecified: Secondary | ICD-10-CM | POA: Diagnosis not present

## 2022-04-05 DIAGNOSIS — J45909 Unspecified asthma, uncomplicated: Secondary | ICD-10-CM | POA: Diagnosis not present

## 2022-04-07 DIAGNOSIS — Z6834 Body mass index (BMI) 34.0-34.9, adult: Secondary | ICD-10-CM | POA: Diagnosis not present

## 2022-04-07 DIAGNOSIS — Z87440 Personal history of urinary (tract) infections: Secondary | ICD-10-CM | POA: Diagnosis not present

## 2022-04-07 DIAGNOSIS — Z9181 History of falling: Secondary | ICD-10-CM | POA: Diagnosis not present

## 2022-04-07 DIAGNOSIS — I10 Essential (primary) hypertension: Secondary | ICD-10-CM | POA: Diagnosis not present

## 2022-04-07 DIAGNOSIS — J309 Allergic rhinitis, unspecified: Secondary | ICD-10-CM | POA: Diagnosis not present

## 2022-04-07 DIAGNOSIS — E785 Hyperlipidemia, unspecified: Secondary | ICD-10-CM | POA: Diagnosis not present

## 2022-04-07 DIAGNOSIS — F5101 Primary insomnia: Secondary | ICD-10-CM | POA: Diagnosis not present

## 2022-04-07 DIAGNOSIS — J45909 Unspecified asthma, uncomplicated: Secondary | ICD-10-CM | POA: Diagnosis not present

## 2022-04-07 DIAGNOSIS — K219 Gastro-esophageal reflux disease without esophagitis: Secondary | ICD-10-CM | POA: Diagnosis not present

## 2022-04-07 DIAGNOSIS — M545 Low back pain, unspecified: Secondary | ICD-10-CM | POA: Diagnosis not present

## 2022-04-07 DIAGNOSIS — S42202D Unspecified fracture of upper end of left humerus, subsequent encounter for fracture with routine healing: Secondary | ICD-10-CM | POA: Diagnosis not present

## 2022-04-07 DIAGNOSIS — S42201D Unspecified fracture of upper end of right humerus, subsequent encounter for fracture with routine healing: Secondary | ICD-10-CM | POA: Diagnosis not present

## 2022-04-07 DIAGNOSIS — E1169 Type 2 diabetes mellitus with other specified complication: Secondary | ICD-10-CM | POA: Diagnosis not present

## 2022-04-07 DIAGNOSIS — R69 Illness, unspecified: Secondary | ICD-10-CM | POA: Diagnosis not present

## 2022-04-07 DIAGNOSIS — Z96611 Presence of right artificial shoulder joint: Secondary | ICD-10-CM | POA: Diagnosis not present

## 2022-04-07 DIAGNOSIS — N3281 Overactive bladder: Secondary | ICD-10-CM | POA: Diagnosis not present

## 2022-04-07 DIAGNOSIS — Z7984 Long term (current) use of oral hypoglycemic drugs: Secondary | ICD-10-CM | POA: Diagnosis not present

## 2022-04-12 DIAGNOSIS — S42201D Unspecified fracture of upper end of right humerus, subsequent encounter for fracture with routine healing: Secondary | ICD-10-CM | POA: Diagnosis not present

## 2022-04-12 DIAGNOSIS — N3281 Overactive bladder: Secondary | ICD-10-CM | POA: Diagnosis not present

## 2022-04-12 DIAGNOSIS — M545 Low back pain, unspecified: Secondary | ICD-10-CM | POA: Diagnosis not present

## 2022-04-12 DIAGNOSIS — Z7984 Long term (current) use of oral hypoglycemic drugs: Secondary | ICD-10-CM | POA: Diagnosis not present

## 2022-04-12 DIAGNOSIS — F5101 Primary insomnia: Secondary | ICD-10-CM | POA: Diagnosis not present

## 2022-04-12 DIAGNOSIS — R69 Illness, unspecified: Secondary | ICD-10-CM | POA: Diagnosis not present

## 2022-04-12 DIAGNOSIS — Z6834 Body mass index (BMI) 34.0-34.9, adult: Secondary | ICD-10-CM | POA: Diagnosis not present

## 2022-04-12 DIAGNOSIS — Z96611 Presence of right artificial shoulder joint: Secondary | ICD-10-CM | POA: Diagnosis not present

## 2022-04-12 DIAGNOSIS — Z87440 Personal history of urinary (tract) infections: Secondary | ICD-10-CM | POA: Diagnosis not present

## 2022-04-12 DIAGNOSIS — E785 Hyperlipidemia, unspecified: Secondary | ICD-10-CM | POA: Diagnosis not present

## 2022-04-12 DIAGNOSIS — J309 Allergic rhinitis, unspecified: Secondary | ICD-10-CM | POA: Diagnosis not present

## 2022-04-12 DIAGNOSIS — I10 Essential (primary) hypertension: Secondary | ICD-10-CM | POA: Diagnosis not present

## 2022-04-12 DIAGNOSIS — S42202D Unspecified fracture of upper end of left humerus, subsequent encounter for fracture with routine healing: Secondary | ICD-10-CM | POA: Diagnosis not present

## 2022-04-12 DIAGNOSIS — Z9181 History of falling: Secondary | ICD-10-CM | POA: Diagnosis not present

## 2022-04-12 DIAGNOSIS — J45909 Unspecified asthma, uncomplicated: Secondary | ICD-10-CM | POA: Diagnosis not present

## 2022-04-12 DIAGNOSIS — K219 Gastro-esophageal reflux disease without esophagitis: Secondary | ICD-10-CM | POA: Diagnosis not present

## 2022-04-12 DIAGNOSIS — E1169 Type 2 diabetes mellitus with other specified complication: Secondary | ICD-10-CM | POA: Diagnosis not present

## 2022-04-13 DIAGNOSIS — J309 Allergic rhinitis, unspecified: Secondary | ICD-10-CM | POA: Diagnosis not present

## 2022-04-13 DIAGNOSIS — S42202D Unspecified fracture of upper end of left humerus, subsequent encounter for fracture with routine healing: Secondary | ICD-10-CM | POA: Diagnosis not present

## 2022-04-13 DIAGNOSIS — E1169 Type 2 diabetes mellitus with other specified complication: Secondary | ICD-10-CM | POA: Diagnosis not present

## 2022-04-13 DIAGNOSIS — Z9181 History of falling: Secondary | ICD-10-CM | POA: Diagnosis not present

## 2022-04-13 DIAGNOSIS — Z6834 Body mass index (BMI) 34.0-34.9, adult: Secondary | ICD-10-CM | POA: Diagnosis not present

## 2022-04-13 DIAGNOSIS — Z7984 Long term (current) use of oral hypoglycemic drugs: Secondary | ICD-10-CM | POA: Diagnosis not present

## 2022-04-13 DIAGNOSIS — Z96611 Presence of right artificial shoulder joint: Secondary | ICD-10-CM | POA: Diagnosis not present

## 2022-04-13 DIAGNOSIS — R69 Illness, unspecified: Secondary | ICD-10-CM | POA: Diagnosis not present

## 2022-04-13 DIAGNOSIS — I10 Essential (primary) hypertension: Secondary | ICD-10-CM | POA: Diagnosis not present

## 2022-04-13 DIAGNOSIS — N3281 Overactive bladder: Secondary | ICD-10-CM | POA: Diagnosis not present

## 2022-04-13 DIAGNOSIS — F5101 Primary insomnia: Secondary | ICD-10-CM | POA: Diagnosis not present

## 2022-04-13 DIAGNOSIS — K219 Gastro-esophageal reflux disease without esophagitis: Secondary | ICD-10-CM | POA: Diagnosis not present

## 2022-04-13 DIAGNOSIS — M545 Low back pain, unspecified: Secondary | ICD-10-CM | POA: Diagnosis not present

## 2022-04-13 DIAGNOSIS — J45909 Unspecified asthma, uncomplicated: Secondary | ICD-10-CM | POA: Diagnosis not present

## 2022-04-13 DIAGNOSIS — Z87440 Personal history of urinary (tract) infections: Secondary | ICD-10-CM | POA: Diagnosis not present

## 2022-04-13 DIAGNOSIS — S42201D Unspecified fracture of upper end of right humerus, subsequent encounter for fracture with routine healing: Secondary | ICD-10-CM | POA: Diagnosis not present

## 2022-04-13 DIAGNOSIS — E785 Hyperlipidemia, unspecified: Secondary | ICD-10-CM | POA: Diagnosis not present

## 2022-04-14 ENCOUNTER — Other Ambulatory Visit: Payer: Self-pay | Admitting: *Deleted

## 2022-04-14 DIAGNOSIS — S42201D Unspecified fracture of upper end of right humerus, subsequent encounter for fracture with routine healing: Secondary | ICD-10-CM | POA: Diagnosis not present

## 2022-04-14 DIAGNOSIS — J309 Allergic rhinitis, unspecified: Secondary | ICD-10-CM | POA: Diagnosis not present

## 2022-04-14 DIAGNOSIS — Z6834 Body mass index (BMI) 34.0-34.9, adult: Secondary | ICD-10-CM | POA: Diagnosis not present

## 2022-04-14 DIAGNOSIS — M545 Low back pain, unspecified: Secondary | ICD-10-CM | POA: Diagnosis not present

## 2022-04-14 DIAGNOSIS — N3281 Overactive bladder: Secondary | ICD-10-CM | POA: Diagnosis not present

## 2022-04-14 DIAGNOSIS — E1169 Type 2 diabetes mellitus with other specified complication: Secondary | ICD-10-CM | POA: Diagnosis not present

## 2022-04-14 DIAGNOSIS — Z7984 Long term (current) use of oral hypoglycemic drugs: Secondary | ICD-10-CM | POA: Diagnosis not present

## 2022-04-14 DIAGNOSIS — R69 Illness, unspecified: Secondary | ICD-10-CM | POA: Diagnosis not present

## 2022-04-14 DIAGNOSIS — Z9181 History of falling: Secondary | ICD-10-CM | POA: Diagnosis not present

## 2022-04-14 DIAGNOSIS — F5101 Primary insomnia: Secondary | ICD-10-CM | POA: Diagnosis not present

## 2022-04-14 DIAGNOSIS — K219 Gastro-esophageal reflux disease without esophagitis: Secondary | ICD-10-CM | POA: Diagnosis not present

## 2022-04-14 DIAGNOSIS — Z87440 Personal history of urinary (tract) infections: Secondary | ICD-10-CM | POA: Diagnosis not present

## 2022-04-14 DIAGNOSIS — I10 Essential (primary) hypertension: Secondary | ICD-10-CM | POA: Diagnosis not present

## 2022-04-14 DIAGNOSIS — J45909 Unspecified asthma, uncomplicated: Secondary | ICD-10-CM | POA: Diagnosis not present

## 2022-04-14 DIAGNOSIS — E785 Hyperlipidemia, unspecified: Secondary | ICD-10-CM | POA: Diagnosis not present

## 2022-04-14 DIAGNOSIS — Z96611 Presence of right artificial shoulder joint: Secondary | ICD-10-CM | POA: Diagnosis not present

## 2022-04-14 DIAGNOSIS — S42202D Unspecified fracture of upper end of left humerus, subsequent encounter for fracture with routine healing: Secondary | ICD-10-CM | POA: Diagnosis not present

## 2022-04-14 MED ORDER — METHOCARBAMOL 500 MG PO TABS: 500.0000 mg | ORAL_TABLET | Freq: Every evening | ORAL | 0 refills | Status: DC | PRN

## 2022-04-14 MED ORDER — SIMVASTATIN 20 MG PO TABS: ORAL_TABLET | ORAL | 3 refills | Status: DC

## 2022-04-14 MED ORDER — TRAZODONE HCL 50 MG PO TABS: ORAL_TABLET | ORAL | 3 refills | Status: DC

## 2022-04-14 NOTE — Telephone Encounter (Signed)
I told her to stop the metformin after last labs Removed from med list   Changed muscle relaxer to qhs which is how she told me she was taking it at last visit Thanks

## 2022-04-14 NOTE — Telephone Encounter (Signed)
Fax refill request, requesting meds be sent to mail order pharmacy.   Methocarbamol on med list as a historical entry

## 2022-04-15 DIAGNOSIS — M545 Low back pain, unspecified: Secondary | ICD-10-CM

## 2022-04-15 DIAGNOSIS — N3281 Overactive bladder: Secondary | ICD-10-CM

## 2022-04-15 DIAGNOSIS — E785 Hyperlipidemia, unspecified: Secondary | ICD-10-CM

## 2022-04-15 DIAGNOSIS — F4323 Adjustment disorder with mixed anxiety and depressed mood: Secondary | ICD-10-CM

## 2022-04-15 DIAGNOSIS — R69 Illness, unspecified: Secondary | ICD-10-CM | POA: Diagnosis not present

## 2022-04-15 DIAGNOSIS — S42202D Unspecified fracture of upper end of left humerus, subsequent encounter for fracture with routine healing: Secondary | ICD-10-CM | POA: Diagnosis not present

## 2022-04-15 DIAGNOSIS — Z6834 Body mass index (BMI) 34.0-34.9, adult: Secondary | ICD-10-CM

## 2022-04-15 DIAGNOSIS — Z9181 History of falling: Secondary | ICD-10-CM

## 2022-04-15 DIAGNOSIS — J45909 Unspecified asthma, uncomplicated: Secondary | ICD-10-CM

## 2022-04-15 DIAGNOSIS — E1169 Type 2 diabetes mellitus with other specified complication: Secondary | ICD-10-CM | POA: Diagnosis not present

## 2022-04-15 DIAGNOSIS — S42201D Unspecified fracture of upper end of right humerus, subsequent encounter for fracture with routine healing: Secondary | ICD-10-CM | POA: Diagnosis not present

## 2022-04-15 DIAGNOSIS — Z7984 Long term (current) use of oral hypoglycemic drugs: Secondary | ICD-10-CM

## 2022-04-15 DIAGNOSIS — Z96611 Presence of right artificial shoulder joint: Secondary | ICD-10-CM

## 2022-04-15 DIAGNOSIS — K219 Gastro-esophageal reflux disease without esophagitis: Secondary | ICD-10-CM

## 2022-04-15 DIAGNOSIS — F5101 Primary insomnia: Secondary | ICD-10-CM

## 2022-04-15 DIAGNOSIS — I10 Essential (primary) hypertension: Secondary | ICD-10-CM

## 2022-04-15 DIAGNOSIS — Z87440 Personal history of urinary (tract) infections: Secondary | ICD-10-CM

## 2022-04-19 DIAGNOSIS — F5101 Primary insomnia: Secondary | ICD-10-CM | POA: Diagnosis not present

## 2022-04-19 DIAGNOSIS — Z6834 Body mass index (BMI) 34.0-34.9, adult: Secondary | ICD-10-CM | POA: Diagnosis not present

## 2022-04-19 DIAGNOSIS — E785 Hyperlipidemia, unspecified: Secondary | ICD-10-CM | POA: Diagnosis not present

## 2022-04-19 DIAGNOSIS — K219 Gastro-esophageal reflux disease without esophagitis: Secondary | ICD-10-CM | POA: Diagnosis not present

## 2022-04-19 DIAGNOSIS — Z87440 Personal history of urinary (tract) infections: Secondary | ICD-10-CM | POA: Diagnosis not present

## 2022-04-19 DIAGNOSIS — Z9181 History of falling: Secondary | ICD-10-CM | POA: Diagnosis not present

## 2022-04-19 DIAGNOSIS — R69 Illness, unspecified: Secondary | ICD-10-CM | POA: Diagnosis not present

## 2022-04-19 DIAGNOSIS — N3281 Overactive bladder: Secondary | ICD-10-CM | POA: Diagnosis not present

## 2022-04-19 DIAGNOSIS — I10 Essential (primary) hypertension: Secondary | ICD-10-CM | POA: Diagnosis not present

## 2022-04-19 DIAGNOSIS — Z96611 Presence of right artificial shoulder joint: Secondary | ICD-10-CM | POA: Diagnosis not present

## 2022-04-19 DIAGNOSIS — J45909 Unspecified asthma, uncomplicated: Secondary | ICD-10-CM | POA: Diagnosis not present

## 2022-04-19 DIAGNOSIS — E1169 Type 2 diabetes mellitus with other specified complication: Secondary | ICD-10-CM | POA: Diagnosis not present

## 2022-04-19 DIAGNOSIS — M545 Low back pain, unspecified: Secondary | ICD-10-CM | POA: Diagnosis not present

## 2022-04-19 DIAGNOSIS — J309 Allergic rhinitis, unspecified: Secondary | ICD-10-CM | POA: Diagnosis not present

## 2022-04-19 DIAGNOSIS — S42201D Unspecified fracture of upper end of right humerus, subsequent encounter for fracture with routine healing: Secondary | ICD-10-CM | POA: Diagnosis not present

## 2022-04-19 DIAGNOSIS — S42202D Unspecified fracture of upper end of left humerus, subsequent encounter for fracture with routine healing: Secondary | ICD-10-CM | POA: Diagnosis not present

## 2022-04-19 DIAGNOSIS — Z7984 Long term (current) use of oral hypoglycemic drugs: Secondary | ICD-10-CM | POA: Diagnosis not present

## 2022-04-22 DIAGNOSIS — Z9181 History of falling: Secondary | ICD-10-CM | POA: Diagnosis not present

## 2022-04-22 DIAGNOSIS — Z96611 Presence of right artificial shoulder joint: Secondary | ICD-10-CM | POA: Diagnosis not present

## 2022-04-22 DIAGNOSIS — S42201D Unspecified fracture of upper end of right humerus, subsequent encounter for fracture with routine healing: Secondary | ICD-10-CM | POA: Diagnosis not present

## 2022-04-22 DIAGNOSIS — S42202D Unspecified fracture of upper end of left humerus, subsequent encounter for fracture with routine healing: Secondary | ICD-10-CM | POA: Diagnosis not present

## 2022-04-22 DIAGNOSIS — K219 Gastro-esophageal reflux disease without esophagitis: Secondary | ICD-10-CM | POA: Diagnosis not present

## 2022-04-22 DIAGNOSIS — I10 Essential (primary) hypertension: Secondary | ICD-10-CM | POA: Diagnosis not present

## 2022-04-22 DIAGNOSIS — J309 Allergic rhinitis, unspecified: Secondary | ICD-10-CM | POA: Diagnosis not present

## 2022-04-22 DIAGNOSIS — Z7984 Long term (current) use of oral hypoglycemic drugs: Secondary | ICD-10-CM | POA: Diagnosis not present

## 2022-04-22 DIAGNOSIS — E1169 Type 2 diabetes mellitus with other specified complication: Secondary | ICD-10-CM | POA: Diagnosis not present

## 2022-04-22 DIAGNOSIS — E785 Hyperlipidemia, unspecified: Secondary | ICD-10-CM | POA: Diagnosis not present

## 2022-04-22 DIAGNOSIS — M545 Low back pain, unspecified: Secondary | ICD-10-CM | POA: Diagnosis not present

## 2022-04-22 DIAGNOSIS — F5101 Primary insomnia: Secondary | ICD-10-CM | POA: Diagnosis not present

## 2022-04-22 DIAGNOSIS — Z87440 Personal history of urinary (tract) infections: Secondary | ICD-10-CM | POA: Diagnosis not present

## 2022-04-22 DIAGNOSIS — J45909 Unspecified asthma, uncomplicated: Secondary | ICD-10-CM | POA: Diagnosis not present

## 2022-04-22 DIAGNOSIS — R69 Illness, unspecified: Secondary | ICD-10-CM | POA: Diagnosis not present

## 2022-04-22 DIAGNOSIS — Z6834 Body mass index (BMI) 34.0-34.9, adult: Secondary | ICD-10-CM | POA: Diagnosis not present

## 2022-04-22 DIAGNOSIS — N3281 Overactive bladder: Secondary | ICD-10-CM | POA: Diagnosis not present

## 2022-04-26 DIAGNOSIS — I10 Essential (primary) hypertension: Secondary | ICD-10-CM | POA: Diagnosis not present

## 2022-04-26 DIAGNOSIS — Z7984 Long term (current) use of oral hypoglycemic drugs: Secondary | ICD-10-CM | POA: Diagnosis not present

## 2022-04-26 DIAGNOSIS — K219 Gastro-esophageal reflux disease without esophagitis: Secondary | ICD-10-CM | POA: Diagnosis not present

## 2022-04-26 DIAGNOSIS — S42201D Unspecified fracture of upper end of right humerus, subsequent encounter for fracture with routine healing: Secondary | ICD-10-CM | POA: Diagnosis not present

## 2022-04-26 DIAGNOSIS — R69 Illness, unspecified: Secondary | ICD-10-CM | POA: Diagnosis not present

## 2022-04-26 DIAGNOSIS — J45909 Unspecified asthma, uncomplicated: Secondary | ICD-10-CM | POA: Diagnosis not present

## 2022-04-26 DIAGNOSIS — M545 Low back pain, unspecified: Secondary | ICD-10-CM | POA: Diagnosis not present

## 2022-04-26 DIAGNOSIS — Z6834 Body mass index (BMI) 34.0-34.9, adult: Secondary | ICD-10-CM | POA: Diagnosis not present

## 2022-04-26 DIAGNOSIS — J309 Allergic rhinitis, unspecified: Secondary | ICD-10-CM | POA: Diagnosis not present

## 2022-04-26 DIAGNOSIS — S42202D Unspecified fracture of upper end of left humerus, subsequent encounter for fracture with routine healing: Secondary | ICD-10-CM | POA: Diagnosis not present

## 2022-04-26 DIAGNOSIS — E1169 Type 2 diabetes mellitus with other specified complication: Secondary | ICD-10-CM | POA: Diagnosis not present

## 2022-04-26 DIAGNOSIS — Z96611 Presence of right artificial shoulder joint: Secondary | ICD-10-CM | POA: Diagnosis not present

## 2022-04-26 DIAGNOSIS — N3281 Overactive bladder: Secondary | ICD-10-CM | POA: Diagnosis not present

## 2022-04-26 DIAGNOSIS — E785 Hyperlipidemia, unspecified: Secondary | ICD-10-CM | POA: Diagnosis not present

## 2022-04-26 DIAGNOSIS — Z87440 Personal history of urinary (tract) infections: Secondary | ICD-10-CM | POA: Diagnosis not present

## 2022-04-26 DIAGNOSIS — F5101 Primary insomnia: Secondary | ICD-10-CM | POA: Diagnosis not present

## 2022-04-26 DIAGNOSIS — Z9181 History of falling: Secondary | ICD-10-CM | POA: Diagnosis not present

## 2022-04-28 DIAGNOSIS — E785 Hyperlipidemia, unspecified: Secondary | ICD-10-CM | POA: Diagnosis not present

## 2022-04-28 DIAGNOSIS — R69 Illness, unspecified: Secondary | ICD-10-CM | POA: Diagnosis not present

## 2022-04-28 DIAGNOSIS — N3281 Overactive bladder: Secondary | ICD-10-CM | POA: Diagnosis not present

## 2022-04-28 DIAGNOSIS — E1169 Type 2 diabetes mellitus with other specified complication: Secondary | ICD-10-CM | POA: Diagnosis not present

## 2022-04-28 DIAGNOSIS — J45909 Unspecified asthma, uncomplicated: Secondary | ICD-10-CM | POA: Diagnosis not present

## 2022-04-28 DIAGNOSIS — Z9181 History of falling: Secondary | ICD-10-CM | POA: Diagnosis not present

## 2022-04-28 DIAGNOSIS — S42201D Unspecified fracture of upper end of right humerus, subsequent encounter for fracture with routine healing: Secondary | ICD-10-CM | POA: Diagnosis not present

## 2022-04-28 DIAGNOSIS — F5101 Primary insomnia: Secondary | ICD-10-CM | POA: Diagnosis not present

## 2022-04-28 DIAGNOSIS — S42202D Unspecified fracture of upper end of left humerus, subsequent encounter for fracture with routine healing: Secondary | ICD-10-CM | POA: Diagnosis not present

## 2022-04-28 DIAGNOSIS — Z87440 Personal history of urinary (tract) infections: Secondary | ICD-10-CM | POA: Diagnosis not present

## 2022-04-28 DIAGNOSIS — I10 Essential (primary) hypertension: Secondary | ICD-10-CM | POA: Diagnosis not present

## 2022-04-28 DIAGNOSIS — K219 Gastro-esophageal reflux disease without esophagitis: Secondary | ICD-10-CM | POA: Diagnosis not present

## 2022-04-28 DIAGNOSIS — Z6834 Body mass index (BMI) 34.0-34.9, adult: Secondary | ICD-10-CM | POA: Diagnosis not present

## 2022-04-28 DIAGNOSIS — Z96611 Presence of right artificial shoulder joint: Secondary | ICD-10-CM | POA: Diagnosis not present

## 2022-04-28 DIAGNOSIS — Z7984 Long term (current) use of oral hypoglycemic drugs: Secondary | ICD-10-CM | POA: Diagnosis not present

## 2022-04-28 DIAGNOSIS — M545 Low back pain, unspecified: Secondary | ICD-10-CM | POA: Diagnosis not present

## 2022-04-28 DIAGNOSIS — J309 Allergic rhinitis, unspecified: Secondary | ICD-10-CM | POA: Diagnosis not present

## 2022-05-02 ENCOUNTER — Telehealth: Payer: Self-pay | Admitting: Family Medicine

## 2022-05-02 DIAGNOSIS — J309 Allergic rhinitis, unspecified: Secondary | ICD-10-CM | POA: Diagnosis not present

## 2022-05-02 DIAGNOSIS — Z6834 Body mass index (BMI) 34.0-34.9, adult: Secondary | ICD-10-CM | POA: Diagnosis not present

## 2022-05-02 DIAGNOSIS — Z96611 Presence of right artificial shoulder joint: Secondary | ICD-10-CM | POA: Diagnosis not present

## 2022-05-02 DIAGNOSIS — Z9181 History of falling: Secondary | ICD-10-CM | POA: Diagnosis not present

## 2022-05-02 DIAGNOSIS — E785 Hyperlipidemia, unspecified: Secondary | ICD-10-CM | POA: Diagnosis not present

## 2022-05-02 DIAGNOSIS — M545 Low back pain, unspecified: Secondary | ICD-10-CM | POA: Diagnosis not present

## 2022-05-02 DIAGNOSIS — K219 Gastro-esophageal reflux disease without esophagitis: Secondary | ICD-10-CM | POA: Diagnosis not present

## 2022-05-02 DIAGNOSIS — J45909 Unspecified asthma, uncomplicated: Secondary | ICD-10-CM | POA: Diagnosis not present

## 2022-05-02 DIAGNOSIS — S42201D Unspecified fracture of upper end of right humerus, subsequent encounter for fracture with routine healing: Secondary | ICD-10-CM | POA: Diagnosis not present

## 2022-05-02 DIAGNOSIS — Z7984 Long term (current) use of oral hypoglycemic drugs: Secondary | ICD-10-CM | POA: Diagnosis not present

## 2022-05-02 DIAGNOSIS — E1169 Type 2 diabetes mellitus with other specified complication: Secondary | ICD-10-CM | POA: Diagnosis not present

## 2022-05-02 DIAGNOSIS — Z87440 Personal history of urinary (tract) infections: Secondary | ICD-10-CM | POA: Diagnosis not present

## 2022-05-02 DIAGNOSIS — R69 Illness, unspecified: Secondary | ICD-10-CM | POA: Diagnosis not present

## 2022-05-02 DIAGNOSIS — F5101 Primary insomnia: Secondary | ICD-10-CM | POA: Diagnosis not present

## 2022-05-02 DIAGNOSIS — S42202D Unspecified fracture of upper end of left humerus, subsequent encounter for fracture with routine healing: Secondary | ICD-10-CM | POA: Diagnosis not present

## 2022-05-02 DIAGNOSIS — I10 Essential (primary) hypertension: Secondary | ICD-10-CM | POA: Diagnosis not present

## 2022-05-02 DIAGNOSIS — N3281 Overactive bladder: Secondary | ICD-10-CM | POA: Diagnosis not present

## 2022-05-02 NOTE — Telephone Encounter (Signed)
Amanda from [facility] called to report that patient is having a lot of right shoulder pain today. Vitals are good,just an elevated pain level with her shoulder.

## 2022-05-02 NOTE — Telephone Encounter (Signed)
Called and talked to daughter. States that she thinks it is only during PT. She is working the shoulder a lot during exercises. Asked that we call the home number in about an hour to ask patient for more information.

## 2022-05-03 DIAGNOSIS — Z6834 Body mass index (BMI) 34.0-34.9, adult: Secondary | ICD-10-CM | POA: Diagnosis not present

## 2022-05-03 DIAGNOSIS — K219 Gastro-esophageal reflux disease without esophagitis: Secondary | ICD-10-CM | POA: Diagnosis not present

## 2022-05-03 DIAGNOSIS — Z7984 Long term (current) use of oral hypoglycemic drugs: Secondary | ICD-10-CM | POA: Diagnosis not present

## 2022-05-03 DIAGNOSIS — Z96611 Presence of right artificial shoulder joint: Secondary | ICD-10-CM | POA: Diagnosis not present

## 2022-05-03 DIAGNOSIS — R69 Illness, unspecified: Secondary | ICD-10-CM | POA: Diagnosis not present

## 2022-05-03 DIAGNOSIS — J45909 Unspecified asthma, uncomplicated: Secondary | ICD-10-CM | POA: Diagnosis not present

## 2022-05-03 DIAGNOSIS — M545 Low back pain, unspecified: Secondary | ICD-10-CM | POA: Diagnosis not present

## 2022-05-03 DIAGNOSIS — N3281 Overactive bladder: Secondary | ICD-10-CM | POA: Diagnosis not present

## 2022-05-03 DIAGNOSIS — Z87440 Personal history of urinary (tract) infections: Secondary | ICD-10-CM | POA: Diagnosis not present

## 2022-05-03 DIAGNOSIS — J309 Allergic rhinitis, unspecified: Secondary | ICD-10-CM | POA: Diagnosis not present

## 2022-05-03 DIAGNOSIS — E1169 Type 2 diabetes mellitus with other specified complication: Secondary | ICD-10-CM | POA: Diagnosis not present

## 2022-05-03 DIAGNOSIS — S42202D Unspecified fracture of upper end of left humerus, subsequent encounter for fracture with routine healing: Secondary | ICD-10-CM | POA: Diagnosis not present

## 2022-05-03 DIAGNOSIS — I10 Essential (primary) hypertension: Secondary | ICD-10-CM | POA: Diagnosis not present

## 2022-05-03 DIAGNOSIS — E785 Hyperlipidemia, unspecified: Secondary | ICD-10-CM | POA: Diagnosis not present

## 2022-05-03 DIAGNOSIS — F5101 Primary insomnia: Secondary | ICD-10-CM | POA: Diagnosis not present

## 2022-05-03 DIAGNOSIS — S42201D Unspecified fracture of upper end of right humerus, subsequent encounter for fracture with routine healing: Secondary | ICD-10-CM | POA: Diagnosis not present

## 2022-05-03 DIAGNOSIS — Z9181 History of falling: Secondary | ICD-10-CM | POA: Diagnosis not present

## 2022-05-05 DIAGNOSIS — Z4789 Encounter for other orthopedic aftercare: Secondary | ICD-10-CM | POA: Diagnosis not present

## 2022-05-05 DIAGNOSIS — S42202A Unspecified fracture of upper end of left humerus, initial encounter for closed fracture: Secondary | ICD-10-CM | POA: Diagnosis not present

## 2022-05-05 NOTE — Telephone Encounter (Signed)
Pt had f/u with Ortho today regarding shoulder, they have eval pt's concerns

## 2022-05-10 DIAGNOSIS — I10 Essential (primary) hypertension: Secondary | ICD-10-CM | POA: Diagnosis not present

## 2022-05-10 DIAGNOSIS — Z7984 Long term (current) use of oral hypoglycemic drugs: Secondary | ICD-10-CM | POA: Diagnosis not present

## 2022-05-10 DIAGNOSIS — Z87440 Personal history of urinary (tract) infections: Secondary | ICD-10-CM | POA: Diagnosis not present

## 2022-05-10 DIAGNOSIS — M545 Low back pain, unspecified: Secondary | ICD-10-CM | POA: Diagnosis not present

## 2022-05-10 DIAGNOSIS — Z9181 History of falling: Secondary | ICD-10-CM | POA: Diagnosis not present

## 2022-05-10 DIAGNOSIS — J309 Allergic rhinitis, unspecified: Secondary | ICD-10-CM | POA: Diagnosis not present

## 2022-05-10 DIAGNOSIS — E1169 Type 2 diabetes mellitus with other specified complication: Secondary | ICD-10-CM | POA: Diagnosis not present

## 2022-05-10 DIAGNOSIS — S42202D Unspecified fracture of upper end of left humerus, subsequent encounter for fracture with routine healing: Secondary | ICD-10-CM | POA: Diagnosis not present

## 2022-05-10 DIAGNOSIS — F5101 Primary insomnia: Secondary | ICD-10-CM | POA: Diagnosis not present

## 2022-05-10 DIAGNOSIS — S42201D Unspecified fracture of upper end of right humerus, subsequent encounter for fracture with routine healing: Secondary | ICD-10-CM | POA: Diagnosis not present

## 2022-05-10 DIAGNOSIS — N3281 Overactive bladder: Secondary | ICD-10-CM | POA: Diagnosis not present

## 2022-05-10 DIAGNOSIS — Z6834 Body mass index (BMI) 34.0-34.9, adult: Secondary | ICD-10-CM | POA: Diagnosis not present

## 2022-05-10 DIAGNOSIS — R69 Illness, unspecified: Secondary | ICD-10-CM | POA: Diagnosis not present

## 2022-05-10 DIAGNOSIS — Z96611 Presence of right artificial shoulder joint: Secondary | ICD-10-CM | POA: Diagnosis not present

## 2022-05-10 DIAGNOSIS — J45909 Unspecified asthma, uncomplicated: Secondary | ICD-10-CM | POA: Diagnosis not present

## 2022-05-10 DIAGNOSIS — E785 Hyperlipidemia, unspecified: Secondary | ICD-10-CM | POA: Diagnosis not present

## 2022-05-10 DIAGNOSIS — K219 Gastro-esophageal reflux disease without esophagitis: Secondary | ICD-10-CM | POA: Diagnosis not present

## 2022-05-13 DIAGNOSIS — S42201D Unspecified fracture of upper end of right humerus, subsequent encounter for fracture with routine healing: Secondary | ICD-10-CM | POA: Diagnosis not present

## 2022-05-13 DIAGNOSIS — E1169 Type 2 diabetes mellitus with other specified complication: Secondary | ICD-10-CM | POA: Diagnosis not present

## 2022-05-13 DIAGNOSIS — Z87440 Personal history of urinary (tract) infections: Secondary | ICD-10-CM | POA: Diagnosis not present

## 2022-05-13 DIAGNOSIS — F5101 Primary insomnia: Secondary | ICD-10-CM | POA: Diagnosis not present

## 2022-05-13 DIAGNOSIS — N3281 Overactive bladder: Secondary | ICD-10-CM | POA: Diagnosis not present

## 2022-05-13 DIAGNOSIS — I10 Essential (primary) hypertension: Secondary | ICD-10-CM | POA: Diagnosis not present

## 2022-05-13 DIAGNOSIS — S42202D Unspecified fracture of upper end of left humerus, subsequent encounter for fracture with routine healing: Secondary | ICD-10-CM | POA: Diagnosis not present

## 2022-05-13 DIAGNOSIS — Z6834 Body mass index (BMI) 34.0-34.9, adult: Secondary | ICD-10-CM | POA: Diagnosis not present

## 2022-05-13 DIAGNOSIS — Z96611 Presence of right artificial shoulder joint: Secondary | ICD-10-CM | POA: Diagnosis not present

## 2022-05-13 DIAGNOSIS — M545 Low back pain, unspecified: Secondary | ICD-10-CM | POA: Diagnosis not present

## 2022-05-13 DIAGNOSIS — Z9181 History of falling: Secondary | ICD-10-CM | POA: Diagnosis not present

## 2022-05-13 DIAGNOSIS — E785 Hyperlipidemia, unspecified: Secondary | ICD-10-CM | POA: Diagnosis not present

## 2022-05-13 DIAGNOSIS — K219 Gastro-esophageal reflux disease without esophagitis: Secondary | ICD-10-CM | POA: Diagnosis not present

## 2022-05-13 DIAGNOSIS — Z7984 Long term (current) use of oral hypoglycemic drugs: Secondary | ICD-10-CM | POA: Diagnosis not present

## 2022-05-13 DIAGNOSIS — J309 Allergic rhinitis, unspecified: Secondary | ICD-10-CM | POA: Diagnosis not present

## 2022-05-13 DIAGNOSIS — J45909 Unspecified asthma, uncomplicated: Secondary | ICD-10-CM | POA: Diagnosis not present

## 2022-05-13 DIAGNOSIS — R69 Illness, unspecified: Secondary | ICD-10-CM | POA: Diagnosis not present

## 2022-05-16 DIAGNOSIS — E1169 Type 2 diabetes mellitus with other specified complication: Secondary | ICD-10-CM | POA: Diagnosis not present

## 2022-05-16 DIAGNOSIS — N3281 Overactive bladder: Secondary | ICD-10-CM | POA: Diagnosis not present

## 2022-05-16 DIAGNOSIS — E785 Hyperlipidemia, unspecified: Secondary | ICD-10-CM | POA: Diagnosis not present

## 2022-05-16 DIAGNOSIS — Z6834 Body mass index (BMI) 34.0-34.9, adult: Secondary | ICD-10-CM | POA: Diagnosis not present

## 2022-05-16 DIAGNOSIS — M545 Low back pain, unspecified: Secondary | ICD-10-CM | POA: Diagnosis not present

## 2022-05-16 DIAGNOSIS — F5101 Primary insomnia: Secondary | ICD-10-CM | POA: Diagnosis not present

## 2022-05-16 DIAGNOSIS — Z96611 Presence of right artificial shoulder joint: Secondary | ICD-10-CM | POA: Diagnosis not present

## 2022-05-16 DIAGNOSIS — S42202D Unspecified fracture of upper end of left humerus, subsequent encounter for fracture with routine healing: Secondary | ICD-10-CM | POA: Diagnosis not present

## 2022-05-16 DIAGNOSIS — S42201D Unspecified fracture of upper end of right humerus, subsequent encounter for fracture with routine healing: Secondary | ICD-10-CM | POA: Diagnosis not present

## 2022-05-16 DIAGNOSIS — I10 Essential (primary) hypertension: Secondary | ICD-10-CM | POA: Diagnosis not present

## 2022-05-16 DIAGNOSIS — Z7984 Long term (current) use of oral hypoglycemic drugs: Secondary | ICD-10-CM | POA: Diagnosis not present

## 2022-05-16 DIAGNOSIS — J309 Allergic rhinitis, unspecified: Secondary | ICD-10-CM | POA: Diagnosis not present

## 2022-05-16 DIAGNOSIS — R69 Illness, unspecified: Secondary | ICD-10-CM | POA: Diagnosis not present

## 2022-05-16 DIAGNOSIS — K219 Gastro-esophageal reflux disease without esophagitis: Secondary | ICD-10-CM | POA: Diagnosis not present

## 2022-05-16 DIAGNOSIS — Z9181 History of falling: Secondary | ICD-10-CM | POA: Diagnosis not present

## 2022-05-16 DIAGNOSIS — Z87440 Personal history of urinary (tract) infections: Secondary | ICD-10-CM | POA: Diagnosis not present

## 2022-05-16 DIAGNOSIS — J45909 Unspecified asthma, uncomplicated: Secondary | ICD-10-CM | POA: Diagnosis not present

## 2022-05-18 DIAGNOSIS — L304 Erythema intertrigo: Secondary | ICD-10-CM | POA: Diagnosis not present

## 2022-05-18 DIAGNOSIS — Z1283 Encounter for screening for malignant neoplasm of skin: Secondary | ICD-10-CM | POA: Diagnosis not present

## 2022-05-18 DIAGNOSIS — B078 Other viral warts: Secondary | ICD-10-CM | POA: Diagnosis not present

## 2022-05-18 DIAGNOSIS — D225 Melanocytic nevi of trunk: Secondary | ICD-10-CM | POA: Diagnosis not present

## 2022-05-19 DIAGNOSIS — R030 Elevated blood-pressure reading, without diagnosis of hypertension: Secondary | ICD-10-CM | POA: Diagnosis not present

## 2022-05-19 DIAGNOSIS — E119 Type 2 diabetes mellitus without complications: Secondary | ICD-10-CM | POA: Diagnosis not present

## 2022-05-19 DIAGNOSIS — Z6832 Body mass index (BMI) 32.0-32.9, adult: Secondary | ICD-10-CM | POA: Diagnosis not present

## 2022-05-19 DIAGNOSIS — E669 Obesity, unspecified: Secondary | ICD-10-CM | POA: Diagnosis not present

## 2022-05-19 DIAGNOSIS — E785 Hyperlipidemia, unspecified: Secondary | ICD-10-CM | POA: Diagnosis not present

## 2022-05-19 DIAGNOSIS — Z008 Encounter for other general examination: Secondary | ICD-10-CM | POA: Diagnosis not present

## 2022-05-19 DIAGNOSIS — Z809 Family history of malignant neoplasm, unspecified: Secondary | ICD-10-CM | POA: Diagnosis not present

## 2022-05-24 DIAGNOSIS — Z87440 Personal history of urinary (tract) infections: Secondary | ICD-10-CM | POA: Diagnosis not present

## 2022-05-24 DIAGNOSIS — N3281 Overactive bladder: Secondary | ICD-10-CM | POA: Diagnosis not present

## 2022-05-24 DIAGNOSIS — M545 Low back pain, unspecified: Secondary | ICD-10-CM | POA: Diagnosis not present

## 2022-05-24 DIAGNOSIS — E785 Hyperlipidemia, unspecified: Secondary | ICD-10-CM | POA: Diagnosis not present

## 2022-05-24 DIAGNOSIS — S42291D Other displaced fracture of upper end of right humerus, subsequent encounter for fracture with routine healing: Secondary | ICD-10-CM | POA: Diagnosis not present

## 2022-05-24 DIAGNOSIS — K219 Gastro-esophageal reflux disease without esophagitis: Secondary | ICD-10-CM | POA: Diagnosis not present

## 2022-05-24 DIAGNOSIS — Z87442 Personal history of urinary calculi: Secondary | ICD-10-CM | POA: Diagnosis not present

## 2022-05-24 DIAGNOSIS — F5101 Primary insomnia: Secondary | ICD-10-CM | POA: Diagnosis not present

## 2022-05-24 DIAGNOSIS — I10 Essential (primary) hypertension: Secondary | ICD-10-CM | POA: Diagnosis not present

## 2022-05-24 DIAGNOSIS — Z9181 History of falling: Secondary | ICD-10-CM | POA: Diagnosis not present

## 2022-05-24 DIAGNOSIS — J45909 Unspecified asthma, uncomplicated: Secondary | ICD-10-CM | POA: Diagnosis not present

## 2022-05-24 DIAGNOSIS — R69 Illness, unspecified: Secondary | ICD-10-CM | POA: Diagnosis not present

## 2022-05-24 DIAGNOSIS — Z6838 Body mass index (BMI) 38.0-38.9, adult: Secondary | ICD-10-CM | POA: Diagnosis not present

## 2022-05-24 DIAGNOSIS — E1169 Type 2 diabetes mellitus with other specified complication: Secondary | ICD-10-CM | POA: Diagnosis not present

## 2022-05-24 DIAGNOSIS — Z96611 Presence of right artificial shoulder joint: Secondary | ICD-10-CM | POA: Diagnosis not present

## 2022-05-24 DIAGNOSIS — S42202D Unspecified fracture of upper end of left humerus, subsequent encounter for fracture with routine healing: Secondary | ICD-10-CM | POA: Diagnosis not present

## 2022-05-24 DIAGNOSIS — G8929 Other chronic pain: Secondary | ICD-10-CM | POA: Diagnosis not present

## 2022-05-24 DIAGNOSIS — Z7984 Long term (current) use of oral hypoglycemic drugs: Secondary | ICD-10-CM | POA: Diagnosis not present

## 2022-05-31 DIAGNOSIS — I10 Essential (primary) hypertension: Secondary | ICD-10-CM | POA: Diagnosis not present

## 2022-05-31 DIAGNOSIS — N3281 Overactive bladder: Secondary | ICD-10-CM | POA: Diagnosis not present

## 2022-05-31 DIAGNOSIS — F5101 Primary insomnia: Secondary | ICD-10-CM | POA: Diagnosis not present

## 2022-05-31 DIAGNOSIS — S42291D Other displaced fracture of upper end of right humerus, subsequent encounter for fracture with routine healing: Secondary | ICD-10-CM | POA: Diagnosis not present

## 2022-05-31 DIAGNOSIS — K219 Gastro-esophageal reflux disease without esophagitis: Secondary | ICD-10-CM | POA: Diagnosis not present

## 2022-05-31 DIAGNOSIS — G8929 Other chronic pain: Secondary | ICD-10-CM | POA: Diagnosis not present

## 2022-05-31 DIAGNOSIS — J45909 Unspecified asthma, uncomplicated: Secondary | ICD-10-CM | POA: Diagnosis not present

## 2022-05-31 DIAGNOSIS — R69 Illness, unspecified: Secondary | ICD-10-CM | POA: Diagnosis not present

## 2022-05-31 DIAGNOSIS — Z6838 Body mass index (BMI) 38.0-38.9, adult: Secondary | ICD-10-CM | POA: Diagnosis not present

## 2022-05-31 DIAGNOSIS — E785 Hyperlipidemia, unspecified: Secondary | ICD-10-CM | POA: Diagnosis not present

## 2022-05-31 DIAGNOSIS — S42202D Unspecified fracture of upper end of left humerus, subsequent encounter for fracture with routine healing: Secondary | ICD-10-CM | POA: Diagnosis not present

## 2022-05-31 DIAGNOSIS — Z87442 Personal history of urinary calculi: Secondary | ICD-10-CM | POA: Diagnosis not present

## 2022-05-31 DIAGNOSIS — E1169 Type 2 diabetes mellitus with other specified complication: Secondary | ICD-10-CM | POA: Diagnosis not present

## 2022-05-31 DIAGNOSIS — Z7984 Long term (current) use of oral hypoglycemic drugs: Secondary | ICD-10-CM | POA: Diagnosis not present

## 2022-05-31 DIAGNOSIS — Z96611 Presence of right artificial shoulder joint: Secondary | ICD-10-CM | POA: Diagnosis not present

## 2022-05-31 DIAGNOSIS — Z87440 Personal history of urinary (tract) infections: Secondary | ICD-10-CM | POA: Diagnosis not present

## 2022-05-31 DIAGNOSIS — M545 Low back pain, unspecified: Secondary | ICD-10-CM | POA: Diagnosis not present

## 2022-05-31 DIAGNOSIS — Z9181 History of falling: Secondary | ICD-10-CM | POA: Diagnosis not present

## 2022-06-06 DIAGNOSIS — F5101 Primary insomnia: Secondary | ICD-10-CM | POA: Diagnosis not present

## 2022-06-06 DIAGNOSIS — Z87440 Personal history of urinary (tract) infections: Secondary | ICD-10-CM | POA: Diagnosis not present

## 2022-06-06 DIAGNOSIS — Z6838 Body mass index (BMI) 38.0-38.9, adult: Secondary | ICD-10-CM | POA: Diagnosis not present

## 2022-06-06 DIAGNOSIS — I10 Essential (primary) hypertension: Secondary | ICD-10-CM | POA: Diagnosis not present

## 2022-06-06 DIAGNOSIS — S42291D Other displaced fracture of upper end of right humerus, subsequent encounter for fracture with routine healing: Secondary | ICD-10-CM | POA: Diagnosis not present

## 2022-06-06 DIAGNOSIS — K219 Gastro-esophageal reflux disease without esophagitis: Secondary | ICD-10-CM | POA: Diagnosis not present

## 2022-06-06 DIAGNOSIS — E1169 Type 2 diabetes mellitus with other specified complication: Secondary | ICD-10-CM | POA: Diagnosis not present

## 2022-06-06 DIAGNOSIS — J45909 Unspecified asthma, uncomplicated: Secondary | ICD-10-CM | POA: Diagnosis not present

## 2022-06-06 DIAGNOSIS — M545 Low back pain, unspecified: Secondary | ICD-10-CM | POA: Diagnosis not present

## 2022-06-06 DIAGNOSIS — E785 Hyperlipidemia, unspecified: Secondary | ICD-10-CM | POA: Diagnosis not present

## 2022-06-06 DIAGNOSIS — Z87442 Personal history of urinary calculi: Secondary | ICD-10-CM | POA: Diagnosis not present

## 2022-06-06 DIAGNOSIS — N3281 Overactive bladder: Secondary | ICD-10-CM | POA: Diagnosis not present

## 2022-06-06 DIAGNOSIS — S42202D Unspecified fracture of upper end of left humerus, subsequent encounter for fracture with routine healing: Secondary | ICD-10-CM | POA: Diagnosis not present

## 2022-06-06 DIAGNOSIS — Z9181 History of falling: Secondary | ICD-10-CM | POA: Diagnosis not present

## 2022-06-06 DIAGNOSIS — Z7984 Long term (current) use of oral hypoglycemic drugs: Secondary | ICD-10-CM | POA: Diagnosis not present

## 2022-06-06 DIAGNOSIS — R69 Illness, unspecified: Secondary | ICD-10-CM | POA: Diagnosis not present

## 2022-06-06 DIAGNOSIS — Z96611 Presence of right artificial shoulder joint: Secondary | ICD-10-CM | POA: Diagnosis not present

## 2022-06-06 DIAGNOSIS — G8929 Other chronic pain: Secondary | ICD-10-CM | POA: Diagnosis not present

## 2022-06-08 DIAGNOSIS — J45909 Unspecified asthma, uncomplicated: Secondary | ICD-10-CM | POA: Diagnosis not present

## 2022-06-08 DIAGNOSIS — R69 Illness, unspecified: Secondary | ICD-10-CM | POA: Diagnosis not present

## 2022-06-08 DIAGNOSIS — K219 Gastro-esophageal reflux disease without esophagitis: Secondary | ICD-10-CM | POA: Diagnosis not present

## 2022-06-08 DIAGNOSIS — Z9181 History of falling: Secondary | ICD-10-CM | POA: Diagnosis not present

## 2022-06-08 DIAGNOSIS — E1169 Type 2 diabetes mellitus with other specified complication: Secondary | ICD-10-CM | POA: Diagnosis not present

## 2022-06-08 DIAGNOSIS — S42202D Unspecified fracture of upper end of left humerus, subsequent encounter for fracture with routine healing: Secondary | ICD-10-CM | POA: Diagnosis not present

## 2022-06-08 DIAGNOSIS — M545 Low back pain, unspecified: Secondary | ICD-10-CM | POA: Diagnosis not present

## 2022-06-08 DIAGNOSIS — Z7984 Long term (current) use of oral hypoglycemic drugs: Secondary | ICD-10-CM | POA: Diagnosis not present

## 2022-06-08 DIAGNOSIS — S42291D Other displaced fracture of upper end of right humerus, subsequent encounter for fracture with routine healing: Secondary | ICD-10-CM | POA: Diagnosis not present

## 2022-06-08 DIAGNOSIS — N3281 Overactive bladder: Secondary | ICD-10-CM | POA: Diagnosis not present

## 2022-06-08 DIAGNOSIS — I10 Essential (primary) hypertension: Secondary | ICD-10-CM | POA: Diagnosis not present

## 2022-06-08 DIAGNOSIS — G8929 Other chronic pain: Secondary | ICD-10-CM | POA: Diagnosis not present

## 2022-06-08 DIAGNOSIS — Z96611 Presence of right artificial shoulder joint: Secondary | ICD-10-CM | POA: Diagnosis not present

## 2022-06-08 DIAGNOSIS — Z87442 Personal history of urinary calculi: Secondary | ICD-10-CM | POA: Diagnosis not present

## 2022-06-08 DIAGNOSIS — Z6838 Body mass index (BMI) 38.0-38.9, adult: Secondary | ICD-10-CM | POA: Diagnosis not present

## 2022-06-08 DIAGNOSIS — E785 Hyperlipidemia, unspecified: Secondary | ICD-10-CM | POA: Diagnosis not present

## 2022-06-08 DIAGNOSIS — Z87440 Personal history of urinary (tract) infections: Secondary | ICD-10-CM | POA: Diagnosis not present

## 2022-06-08 DIAGNOSIS — F5101 Primary insomnia: Secondary | ICD-10-CM | POA: Diagnosis not present

## 2022-06-14 DIAGNOSIS — M545 Low back pain, unspecified: Secondary | ICD-10-CM | POA: Diagnosis not present

## 2022-06-14 DIAGNOSIS — Z87440 Personal history of urinary (tract) infections: Secondary | ICD-10-CM | POA: Diagnosis not present

## 2022-06-14 DIAGNOSIS — E785 Hyperlipidemia, unspecified: Secondary | ICD-10-CM | POA: Diagnosis not present

## 2022-06-14 DIAGNOSIS — Z9181 History of falling: Secondary | ICD-10-CM | POA: Diagnosis not present

## 2022-06-14 DIAGNOSIS — N3281 Overactive bladder: Secondary | ICD-10-CM | POA: Diagnosis not present

## 2022-06-14 DIAGNOSIS — K219 Gastro-esophageal reflux disease without esophagitis: Secondary | ICD-10-CM | POA: Diagnosis not present

## 2022-06-14 DIAGNOSIS — Z7984 Long term (current) use of oral hypoglycemic drugs: Secondary | ICD-10-CM | POA: Diagnosis not present

## 2022-06-14 DIAGNOSIS — Z96611 Presence of right artificial shoulder joint: Secondary | ICD-10-CM | POA: Diagnosis not present

## 2022-06-14 DIAGNOSIS — J45909 Unspecified asthma, uncomplicated: Secondary | ICD-10-CM | POA: Diagnosis not present

## 2022-06-14 DIAGNOSIS — Z6838 Body mass index (BMI) 38.0-38.9, adult: Secondary | ICD-10-CM | POA: Diagnosis not present

## 2022-06-14 DIAGNOSIS — G8929 Other chronic pain: Secondary | ICD-10-CM | POA: Diagnosis not present

## 2022-06-14 DIAGNOSIS — I10 Essential (primary) hypertension: Secondary | ICD-10-CM | POA: Diagnosis not present

## 2022-06-14 DIAGNOSIS — E1169 Type 2 diabetes mellitus with other specified complication: Secondary | ICD-10-CM | POA: Diagnosis not present

## 2022-06-14 DIAGNOSIS — F5101 Primary insomnia: Secondary | ICD-10-CM | POA: Diagnosis not present

## 2022-06-14 DIAGNOSIS — S42291D Other displaced fracture of upper end of right humerus, subsequent encounter for fracture with routine healing: Secondary | ICD-10-CM | POA: Diagnosis not present

## 2022-06-14 DIAGNOSIS — Z87442 Personal history of urinary calculi: Secondary | ICD-10-CM | POA: Diagnosis not present

## 2022-06-14 DIAGNOSIS — R69 Illness, unspecified: Secondary | ICD-10-CM | POA: Diagnosis not present

## 2022-06-14 DIAGNOSIS — S42202D Unspecified fracture of upper end of left humerus, subsequent encounter for fracture with routine healing: Secondary | ICD-10-CM | POA: Diagnosis not present

## 2022-06-16 DIAGNOSIS — I10 Essential (primary) hypertension: Secondary | ICD-10-CM | POA: Diagnosis not present

## 2022-06-16 DIAGNOSIS — Z96611 Presence of right artificial shoulder joint: Secondary | ICD-10-CM | POA: Diagnosis not present

## 2022-06-16 DIAGNOSIS — Z7984 Long term (current) use of oral hypoglycemic drugs: Secondary | ICD-10-CM | POA: Diagnosis not present

## 2022-06-16 DIAGNOSIS — Z6838 Body mass index (BMI) 38.0-38.9, adult: Secondary | ICD-10-CM | POA: Diagnosis not present

## 2022-06-16 DIAGNOSIS — N3281 Overactive bladder: Secondary | ICD-10-CM | POA: Diagnosis not present

## 2022-06-16 DIAGNOSIS — Z87440 Personal history of urinary (tract) infections: Secondary | ICD-10-CM | POA: Diagnosis not present

## 2022-06-16 DIAGNOSIS — J45909 Unspecified asthma, uncomplicated: Secondary | ICD-10-CM | POA: Diagnosis not present

## 2022-06-16 DIAGNOSIS — F5101 Primary insomnia: Secondary | ICD-10-CM | POA: Diagnosis not present

## 2022-06-16 DIAGNOSIS — S42202D Unspecified fracture of upper end of left humerus, subsequent encounter for fracture with routine healing: Secondary | ICD-10-CM | POA: Diagnosis not present

## 2022-06-16 DIAGNOSIS — Z9181 History of falling: Secondary | ICD-10-CM | POA: Diagnosis not present

## 2022-06-16 DIAGNOSIS — K219 Gastro-esophageal reflux disease without esophagitis: Secondary | ICD-10-CM | POA: Diagnosis not present

## 2022-06-16 DIAGNOSIS — Z87442 Personal history of urinary calculi: Secondary | ICD-10-CM | POA: Diagnosis not present

## 2022-06-16 DIAGNOSIS — E1169 Type 2 diabetes mellitus with other specified complication: Secondary | ICD-10-CM | POA: Diagnosis not present

## 2022-06-16 DIAGNOSIS — R69 Illness, unspecified: Secondary | ICD-10-CM | POA: Diagnosis not present

## 2022-06-16 DIAGNOSIS — E785 Hyperlipidemia, unspecified: Secondary | ICD-10-CM | POA: Diagnosis not present

## 2022-06-16 DIAGNOSIS — S42291D Other displaced fracture of upper end of right humerus, subsequent encounter for fracture with routine healing: Secondary | ICD-10-CM | POA: Diagnosis not present

## 2022-06-16 DIAGNOSIS — M545 Low back pain, unspecified: Secondary | ICD-10-CM | POA: Diagnosis not present

## 2022-06-16 DIAGNOSIS — G8929 Other chronic pain: Secondary | ICD-10-CM | POA: Diagnosis not present

## 2022-06-22 DIAGNOSIS — Z9181 History of falling: Secondary | ICD-10-CM | POA: Diagnosis not present

## 2022-06-22 DIAGNOSIS — M545 Low back pain, unspecified: Secondary | ICD-10-CM | POA: Diagnosis not present

## 2022-06-22 DIAGNOSIS — N3281 Overactive bladder: Secondary | ICD-10-CM | POA: Diagnosis not present

## 2022-06-22 DIAGNOSIS — E785 Hyperlipidemia, unspecified: Secondary | ICD-10-CM | POA: Diagnosis not present

## 2022-06-22 DIAGNOSIS — Z96611 Presence of right artificial shoulder joint: Secondary | ICD-10-CM | POA: Diagnosis not present

## 2022-06-22 DIAGNOSIS — K219 Gastro-esophageal reflux disease without esophagitis: Secondary | ICD-10-CM | POA: Diagnosis not present

## 2022-06-22 DIAGNOSIS — G8929 Other chronic pain: Secondary | ICD-10-CM | POA: Diagnosis not present

## 2022-06-22 DIAGNOSIS — Z87442 Personal history of urinary calculi: Secondary | ICD-10-CM | POA: Diagnosis not present

## 2022-06-22 DIAGNOSIS — Z87440 Personal history of urinary (tract) infections: Secondary | ICD-10-CM | POA: Diagnosis not present

## 2022-06-22 DIAGNOSIS — I10 Essential (primary) hypertension: Secondary | ICD-10-CM | POA: Diagnosis not present

## 2022-06-22 DIAGNOSIS — S42202D Unspecified fracture of upper end of left humerus, subsequent encounter for fracture with routine healing: Secondary | ICD-10-CM | POA: Diagnosis not present

## 2022-06-22 DIAGNOSIS — F5101 Primary insomnia: Secondary | ICD-10-CM | POA: Diagnosis not present

## 2022-06-22 DIAGNOSIS — F4323 Adjustment disorder with mixed anxiety and depressed mood: Secondary | ICD-10-CM | POA: Diagnosis not present

## 2022-06-22 DIAGNOSIS — J45909 Unspecified asthma, uncomplicated: Secondary | ICD-10-CM | POA: Diagnosis not present

## 2022-06-22 DIAGNOSIS — Z7984 Long term (current) use of oral hypoglycemic drugs: Secondary | ICD-10-CM | POA: Diagnosis not present

## 2022-06-22 DIAGNOSIS — E1169 Type 2 diabetes mellitus with other specified complication: Secondary | ICD-10-CM | POA: Diagnosis not present

## 2022-06-22 DIAGNOSIS — S42291D Other displaced fracture of upper end of right humerus, subsequent encounter for fracture with routine healing: Secondary | ICD-10-CM | POA: Diagnosis not present

## 2022-06-22 DIAGNOSIS — Z6838 Body mass index (BMI) 38.0-38.9, adult: Secondary | ICD-10-CM | POA: Diagnosis not present

## 2022-06-27 ENCOUNTER — Other Ambulatory Visit: Payer: Self-pay | Admitting: Family Medicine

## 2022-06-28 DIAGNOSIS — S42202D Unspecified fracture of upper end of left humerus, subsequent encounter for fracture with routine healing: Secondary | ICD-10-CM | POA: Diagnosis not present

## 2022-06-28 DIAGNOSIS — Z96611 Presence of right artificial shoulder joint: Secondary | ICD-10-CM | POA: Diagnosis not present

## 2022-06-28 DIAGNOSIS — Z6838 Body mass index (BMI) 38.0-38.9, adult: Secondary | ICD-10-CM | POA: Diagnosis not present

## 2022-06-28 DIAGNOSIS — K219 Gastro-esophageal reflux disease without esophagitis: Secondary | ICD-10-CM | POA: Diagnosis not present

## 2022-06-28 DIAGNOSIS — Z87440 Personal history of urinary (tract) infections: Secondary | ICD-10-CM | POA: Diagnosis not present

## 2022-06-28 DIAGNOSIS — F5101 Primary insomnia: Secondary | ICD-10-CM | POA: Diagnosis not present

## 2022-06-28 DIAGNOSIS — Z87442 Personal history of urinary calculi: Secondary | ICD-10-CM | POA: Diagnosis not present

## 2022-06-28 DIAGNOSIS — J45909 Unspecified asthma, uncomplicated: Secondary | ICD-10-CM | POA: Diagnosis not present

## 2022-06-28 DIAGNOSIS — Z7984 Long term (current) use of oral hypoglycemic drugs: Secondary | ICD-10-CM | POA: Diagnosis not present

## 2022-06-28 DIAGNOSIS — Z9181 History of falling: Secondary | ICD-10-CM | POA: Diagnosis not present

## 2022-06-28 DIAGNOSIS — E1169 Type 2 diabetes mellitus with other specified complication: Secondary | ICD-10-CM | POA: Diagnosis not present

## 2022-06-28 DIAGNOSIS — F4323 Adjustment disorder with mixed anxiety and depressed mood: Secondary | ICD-10-CM | POA: Diagnosis not present

## 2022-06-28 DIAGNOSIS — G8929 Other chronic pain: Secondary | ICD-10-CM | POA: Diagnosis not present

## 2022-06-28 DIAGNOSIS — I10 Essential (primary) hypertension: Secondary | ICD-10-CM | POA: Diagnosis not present

## 2022-06-28 DIAGNOSIS — E785 Hyperlipidemia, unspecified: Secondary | ICD-10-CM | POA: Diagnosis not present

## 2022-06-28 DIAGNOSIS — M545 Low back pain, unspecified: Secondary | ICD-10-CM | POA: Diagnosis not present

## 2022-06-28 DIAGNOSIS — N3281 Overactive bladder: Secondary | ICD-10-CM | POA: Diagnosis not present

## 2022-06-28 DIAGNOSIS — S42291D Other displaced fracture of upper end of right humerus, subsequent encounter for fracture with routine healing: Secondary | ICD-10-CM | POA: Diagnosis not present

## 2022-06-28 NOTE — Telephone Encounter (Signed)
Last filled on 04/14/22 #90 tabs with 0 refills, last OV was a f/u on 03/28/22

## 2022-06-30 DIAGNOSIS — Z96611 Presence of right artificial shoulder joint: Secondary | ICD-10-CM | POA: Diagnosis not present

## 2022-06-30 DIAGNOSIS — E1169 Type 2 diabetes mellitus with other specified complication: Secondary | ICD-10-CM | POA: Diagnosis not present

## 2022-06-30 DIAGNOSIS — I10 Essential (primary) hypertension: Secondary | ICD-10-CM | POA: Diagnosis not present

## 2022-06-30 DIAGNOSIS — S42291D Other displaced fracture of upper end of right humerus, subsequent encounter for fracture with routine healing: Secondary | ICD-10-CM | POA: Diagnosis not present

## 2022-06-30 DIAGNOSIS — G8929 Other chronic pain: Secondary | ICD-10-CM | POA: Diagnosis not present

## 2022-06-30 DIAGNOSIS — Z7984 Long term (current) use of oral hypoglycemic drugs: Secondary | ICD-10-CM | POA: Diagnosis not present

## 2022-06-30 DIAGNOSIS — S42202D Unspecified fracture of upper end of left humerus, subsequent encounter for fracture with routine healing: Secondary | ICD-10-CM | POA: Diagnosis not present

## 2022-06-30 DIAGNOSIS — Z6838 Body mass index (BMI) 38.0-38.9, adult: Secondary | ICD-10-CM | POA: Diagnosis not present

## 2022-06-30 DIAGNOSIS — J45909 Unspecified asthma, uncomplicated: Secondary | ICD-10-CM | POA: Diagnosis not present

## 2022-06-30 DIAGNOSIS — Z9181 History of falling: Secondary | ICD-10-CM | POA: Diagnosis not present

## 2022-06-30 DIAGNOSIS — Z87442 Personal history of urinary calculi: Secondary | ICD-10-CM | POA: Diagnosis not present

## 2022-06-30 DIAGNOSIS — K219 Gastro-esophageal reflux disease without esophagitis: Secondary | ICD-10-CM | POA: Diagnosis not present

## 2022-06-30 DIAGNOSIS — F5101 Primary insomnia: Secondary | ICD-10-CM | POA: Diagnosis not present

## 2022-06-30 DIAGNOSIS — F4323 Adjustment disorder with mixed anxiety and depressed mood: Secondary | ICD-10-CM | POA: Diagnosis not present

## 2022-06-30 DIAGNOSIS — N3281 Overactive bladder: Secondary | ICD-10-CM | POA: Diagnosis not present

## 2022-06-30 DIAGNOSIS — Z87440 Personal history of urinary (tract) infections: Secondary | ICD-10-CM | POA: Diagnosis not present

## 2022-06-30 DIAGNOSIS — E785 Hyperlipidemia, unspecified: Secondary | ICD-10-CM | POA: Diagnosis not present

## 2022-06-30 DIAGNOSIS — M545 Low back pain, unspecified: Secondary | ICD-10-CM | POA: Diagnosis not present

## 2022-07-07 ENCOUNTER — Telehealth: Payer: Self-pay | Admitting: Family Medicine

## 2022-07-07 NOTE — Telephone Encounter (Signed)
Melissa from [facility] called in and stated that patient missed her OT visit. She stated that patient had to out dog to sleep and didn't want to see anybody. Thank you!

## 2022-07-07 NOTE — Telephone Encounter (Signed)
So sorry to hear that Thanks for letting me know   She was very close to her dog and this is going to be hard on her  If she needs to come in to discuss grief please make an appt

## 2022-07-12 ENCOUNTER — Encounter: Payer: Self-pay | Admitting: Family Medicine

## 2022-07-12 DIAGNOSIS — K219 Gastro-esophageal reflux disease without esophagitis: Secondary | ICD-10-CM | POA: Diagnosis not present

## 2022-07-12 DIAGNOSIS — F5101 Primary insomnia: Secondary | ICD-10-CM | POA: Diagnosis not present

## 2022-07-12 DIAGNOSIS — S42202D Unspecified fracture of upper end of left humerus, subsequent encounter for fracture with routine healing: Secondary | ICD-10-CM | POA: Diagnosis not present

## 2022-07-12 DIAGNOSIS — Z6838 Body mass index (BMI) 38.0-38.9, adult: Secondary | ICD-10-CM | POA: Diagnosis not present

## 2022-07-12 DIAGNOSIS — S42291D Other displaced fracture of upper end of right humerus, subsequent encounter for fracture with routine healing: Secondary | ICD-10-CM | POA: Diagnosis not present

## 2022-07-12 DIAGNOSIS — I10 Essential (primary) hypertension: Secondary | ICD-10-CM | POA: Diagnosis not present

## 2022-07-12 DIAGNOSIS — Z87442 Personal history of urinary calculi: Secondary | ICD-10-CM | POA: Diagnosis not present

## 2022-07-12 DIAGNOSIS — Z7984 Long term (current) use of oral hypoglycemic drugs: Secondary | ICD-10-CM | POA: Diagnosis not present

## 2022-07-12 DIAGNOSIS — G8929 Other chronic pain: Secondary | ICD-10-CM | POA: Diagnosis not present

## 2022-07-12 DIAGNOSIS — N3281 Overactive bladder: Secondary | ICD-10-CM | POA: Diagnosis not present

## 2022-07-12 DIAGNOSIS — F4323 Adjustment disorder with mixed anxiety and depressed mood: Secondary | ICD-10-CM | POA: Diagnosis not present

## 2022-07-12 DIAGNOSIS — J45909 Unspecified asthma, uncomplicated: Secondary | ICD-10-CM | POA: Diagnosis not present

## 2022-07-12 DIAGNOSIS — Z87440 Personal history of urinary (tract) infections: Secondary | ICD-10-CM | POA: Diagnosis not present

## 2022-07-12 DIAGNOSIS — M545 Low back pain, unspecified: Secondary | ICD-10-CM | POA: Diagnosis not present

## 2022-07-12 DIAGNOSIS — E785 Hyperlipidemia, unspecified: Secondary | ICD-10-CM | POA: Diagnosis not present

## 2022-07-12 DIAGNOSIS — E1169 Type 2 diabetes mellitus with other specified complication: Secondary | ICD-10-CM | POA: Diagnosis not present

## 2022-07-12 DIAGNOSIS — Z9181 History of falling: Secondary | ICD-10-CM | POA: Diagnosis not present

## 2022-07-12 DIAGNOSIS — Z96611 Presence of right artificial shoulder joint: Secondary | ICD-10-CM | POA: Diagnosis not present

## 2022-07-13 ENCOUNTER — Encounter: Payer: Self-pay | Admitting: Family Medicine

## 2022-07-13 ENCOUNTER — Ambulatory Visit (INDEPENDENT_AMBULATORY_CARE_PROVIDER_SITE_OTHER): Payer: Medicare HMO | Admitting: Family Medicine

## 2022-07-13 VITALS — BP 130/84 | HR 86 | Temp 97.1°F | Ht 60.0 in | Wt 170.2 lb

## 2022-07-13 DIAGNOSIS — F4321 Adjustment disorder with depressed mood: Secondary | ICD-10-CM

## 2022-07-13 DIAGNOSIS — F4323 Adjustment disorder with mixed anxiety and depressed mood: Secondary | ICD-10-CM

## 2022-07-13 DIAGNOSIS — F4320 Adjustment disorder, unspecified: Secondary | ICD-10-CM | POA: Insufficient documentation

## 2022-07-13 MED ORDER — FLUOXETINE HCL 10 MG PO CAPS: ORAL_CAPSULE | ORAL | 2 refills | Status: DC

## 2022-07-13 NOTE — Assessment & Plan Note (Signed)
Worsened with grief of losing a dog she was very close to  Reviewed stressors/ coping techniques/symptoms/ support sources/ tx options and side effects in detail today Good support  Taking trazodone for sleep   Plan to try fluoxetine (has had before) Offered counseling referral See a/p for grief rxn

## 2022-07-13 NOTE — Patient Instructions (Addendum)
Let us know if you become interested in counseling for grief   Start fluoxetine 10 mg 1 pill daily in the morning for a week If no side effects then go up to 2 pills once daily in the morning  You can continue the trazadone   If any side effects or if you feel worse-hold the medicine and let us know   Try and eat and drink normally  When you are ready- socialize and be more active and get outside   Do things that soothe you and make you feel better   Talk to friends and family    Follow up in 4-5 weeks   If you need anything earlier let us know

## 2022-07-13 NOTE — Telephone Encounter (Signed)
Patient is on the schedule today with Dr.Tower 07/13/22

## 2022-07-13 NOTE — Assessment & Plan Note (Addendum)
Pt lost dog she was very close to  Symptoms of depression and anxiety but no SI or thoughts of self harm Reviewed stressors/ coping techniques/symptoms/ support sources/ tx options and side effects in detail today Has good support / family /daughter  Offered ref for grief counseling- declines now but may consider later  Will continue trazodone for sleep Has taken fluoxetine for grief in past and was helpful (when she lost mom had prolonged grief)  Px 10 mg to titrate to 20 if well tolerated after a week Discussed expectations of SSRI medication including time to effectiveness and mechanism of action, also poss of side effects (early and late)- including mental fuzziness, weight or appetite change, nausea and poss of worse dep or anxiety (even suicidal thoughts)  Pt voiced understanding and will stop med and update if this occurs  Will watch for any interaction with trazodone (seretonin synd) as well F/u in 4-5 weeks Encouraged good self care Socialize when ready  Be active/ get outdoors when able  31  Minutes were spent today both face to face and in the chart obtaining history performing exam , educating and discussing treatment options, and placing orders
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
## 2022-07-15 ENCOUNTER — Other Ambulatory Visit: Payer: Self-pay | Admitting: Family Medicine

## 2022-07-15 DIAGNOSIS — Z96611 Presence of right artificial shoulder joint: Secondary | ICD-10-CM | POA: Diagnosis not present

## 2022-07-15 DIAGNOSIS — F5101 Primary insomnia: Secondary | ICD-10-CM | POA: Diagnosis not present

## 2022-07-15 DIAGNOSIS — N3281 Overactive bladder: Secondary | ICD-10-CM | POA: Diagnosis not present

## 2022-07-15 DIAGNOSIS — Z6838 Body mass index (BMI) 38.0-38.9, adult: Secondary | ICD-10-CM | POA: Diagnosis not present

## 2022-07-15 DIAGNOSIS — Z9181 History of falling: Secondary | ICD-10-CM | POA: Diagnosis not present

## 2022-07-15 DIAGNOSIS — E785 Hyperlipidemia, unspecified: Secondary | ICD-10-CM | POA: Diagnosis not present

## 2022-07-15 DIAGNOSIS — J45909 Unspecified asthma, uncomplicated: Secondary | ICD-10-CM | POA: Diagnosis not present

## 2022-07-15 DIAGNOSIS — F4323 Adjustment disorder with mixed anxiety and depressed mood: Secondary | ICD-10-CM | POA: Diagnosis not present

## 2022-07-15 DIAGNOSIS — E1169 Type 2 diabetes mellitus with other specified complication: Secondary | ICD-10-CM | POA: Diagnosis not present

## 2022-07-15 DIAGNOSIS — Z87442 Personal history of urinary calculi: Secondary | ICD-10-CM | POA: Diagnosis not present

## 2022-07-15 DIAGNOSIS — I10 Essential (primary) hypertension: Secondary | ICD-10-CM | POA: Diagnosis not present

## 2022-07-15 DIAGNOSIS — Z7984 Long term (current) use of oral hypoglycemic drugs: Secondary | ICD-10-CM | POA: Diagnosis not present

## 2022-07-15 DIAGNOSIS — G8929 Other chronic pain: Secondary | ICD-10-CM | POA: Diagnosis not present

## 2022-07-15 DIAGNOSIS — S42202D Unspecified fracture of upper end of left humerus, subsequent encounter for fracture with routine healing: Secondary | ICD-10-CM | POA: Diagnosis not present

## 2022-07-15 DIAGNOSIS — K219 Gastro-esophageal reflux disease without esophagitis: Secondary | ICD-10-CM | POA: Diagnosis not present

## 2022-07-15 DIAGNOSIS — S42291D Other displaced fracture of upper end of right humerus, subsequent encounter for fracture with routine healing: Secondary | ICD-10-CM | POA: Diagnosis not present

## 2022-07-15 DIAGNOSIS — M545 Low back pain, unspecified: Secondary | ICD-10-CM | POA: Diagnosis not present

## 2022-07-15 DIAGNOSIS — Z87440 Personal history of urinary (tract) infections: Secondary | ICD-10-CM | POA: Diagnosis not present

## 2022-07-15 MED ORDER — FLUOXETINE HCL 10 MG PO CAPS: 20.0000 mg | ORAL_CAPSULE | Freq: Every day | ORAL | 1 refills | Status: DC

## 2022-07-15 MED ORDER — FLUOXETINE HCL 20 MG PO CAPS: 20.0000 mg | ORAL_CAPSULE | Freq: Every day | ORAL | 3 refills | Status: DC

## 2022-07-15 NOTE — Telephone Encounter (Signed)
What I'm going for is once daily for 7 days then 2 daily (10 mg for a week then go up to 20) When I wrote it like that they denied it  - perhaps I wrote for too many Then I wrote for 20 mg daily  but told her for the first week just to take 1   I guess I will write 2 px  One for 10 mg daily (for 7 d) The second for 20 mg daily

## 2022-07-15 NOTE — Telephone Encounter (Signed)
And it is for fluoxetine not paroxietine   Sent in 2 separate px    Tell pt  Take the 10 mg daily  (first px) for 7 d  Then the 20 mg daily after that

## 2022-07-15 NOTE — Telephone Encounter (Signed)
Pt's daughter Alvis Lemmings notified of the change in meds and Dr. Royden Purl comments

## 2022-07-15 NOTE — Telephone Encounter (Signed)
Thanks for letting me know   I re sent it with instructions to take 2 pills every day  What I want you to actually do is take one pill daily for a week before increasing to 2 pills once daily   Let me know if that makes sense  I guess the previous instructions were too complex

## 2022-07-18 ENCOUNTER — Telehealth: Payer: Self-pay | Admitting: Family Medicine

## 2022-07-18 DIAGNOSIS — Z6838 Body mass index (BMI) 38.0-38.9, adult: Secondary | ICD-10-CM | POA: Diagnosis not present

## 2022-07-18 DIAGNOSIS — Z87442 Personal history of urinary calculi: Secondary | ICD-10-CM | POA: Diagnosis not present

## 2022-07-18 DIAGNOSIS — Z87440 Personal history of urinary (tract) infections: Secondary | ICD-10-CM | POA: Diagnosis not present

## 2022-07-18 DIAGNOSIS — N3281 Overactive bladder: Secondary | ICD-10-CM | POA: Diagnosis not present

## 2022-07-18 DIAGNOSIS — G8929 Other chronic pain: Secondary | ICD-10-CM | POA: Diagnosis not present

## 2022-07-18 DIAGNOSIS — Z9181 History of falling: Secondary | ICD-10-CM | POA: Diagnosis not present

## 2022-07-18 DIAGNOSIS — E1169 Type 2 diabetes mellitus with other specified complication: Secondary | ICD-10-CM | POA: Diagnosis not present

## 2022-07-18 DIAGNOSIS — F5101 Primary insomnia: Secondary | ICD-10-CM | POA: Diagnosis not present

## 2022-07-18 DIAGNOSIS — J45909 Unspecified asthma, uncomplicated: Secondary | ICD-10-CM | POA: Diagnosis not present

## 2022-07-18 DIAGNOSIS — Z7984 Long term (current) use of oral hypoglycemic drugs: Secondary | ICD-10-CM | POA: Diagnosis not present

## 2022-07-18 DIAGNOSIS — M545 Low back pain, unspecified: Secondary | ICD-10-CM | POA: Diagnosis not present

## 2022-07-18 DIAGNOSIS — S42291D Other displaced fracture of upper end of right humerus, subsequent encounter for fracture with routine healing: Secondary | ICD-10-CM | POA: Diagnosis not present

## 2022-07-18 DIAGNOSIS — S42202D Unspecified fracture of upper end of left humerus, subsequent encounter for fracture with routine healing: Secondary | ICD-10-CM | POA: Diagnosis not present

## 2022-07-18 DIAGNOSIS — E785 Hyperlipidemia, unspecified: Secondary | ICD-10-CM | POA: Diagnosis not present

## 2022-07-18 DIAGNOSIS — K219 Gastro-esophageal reflux disease without esophagitis: Secondary | ICD-10-CM | POA: Diagnosis not present

## 2022-07-18 DIAGNOSIS — Z96611 Presence of right artificial shoulder joint: Secondary | ICD-10-CM | POA: Diagnosis not present

## 2022-07-18 DIAGNOSIS — I10 Essential (primary) hypertension: Secondary | ICD-10-CM | POA: Diagnosis not present

## 2022-07-18 DIAGNOSIS — F4323 Adjustment disorder with mixed anxiety and depressed mood: Secondary | ICD-10-CM | POA: Diagnosis not present

## 2022-07-18 NOTE — Telephone Encounter (Signed)
Home Health verbal orders Caller Name:Jim Agency Name: [facility] number: [phone]  Requesting OT/PT/Skilled nursing/Social Work/Speech: OT  Reason:exercises on shoulder  Frequency:1 wk 1 2 wk 2 1wk 2  Please forward to [facility] pool or providers CMA

## 2022-07-18 NOTE — Telephone Encounter (Signed)
Please ok those verbal orders  

## 2022-07-18 NOTE — Telephone Encounter (Signed)
VO given to [employer]

## 2022-07-20 DIAGNOSIS — Z7984 Long term (current) use of oral hypoglycemic drugs: Secondary | ICD-10-CM | POA: Diagnosis not present

## 2022-07-20 DIAGNOSIS — M545 Low back pain, unspecified: Secondary | ICD-10-CM | POA: Diagnosis not present

## 2022-07-20 DIAGNOSIS — J45909 Unspecified asthma, uncomplicated: Secondary | ICD-10-CM | POA: Diagnosis not present

## 2022-07-20 DIAGNOSIS — F5101 Primary insomnia: Secondary | ICD-10-CM | POA: Diagnosis not present

## 2022-07-20 DIAGNOSIS — E785 Hyperlipidemia, unspecified: Secondary | ICD-10-CM | POA: Diagnosis not present

## 2022-07-20 DIAGNOSIS — Z9181 History of falling: Secondary | ICD-10-CM | POA: Diagnosis not present

## 2022-07-20 DIAGNOSIS — E1169 Type 2 diabetes mellitus with other specified complication: Secondary | ICD-10-CM | POA: Diagnosis not present

## 2022-07-20 DIAGNOSIS — N3281 Overactive bladder: Secondary | ICD-10-CM | POA: Diagnosis not present

## 2022-07-20 DIAGNOSIS — K219 Gastro-esophageal reflux disease without esophagitis: Secondary | ICD-10-CM | POA: Diagnosis not present

## 2022-07-20 DIAGNOSIS — S42291D Other displaced fracture of upper end of right humerus, subsequent encounter for fracture with routine healing: Secondary | ICD-10-CM | POA: Diagnosis not present

## 2022-07-20 DIAGNOSIS — I10 Essential (primary) hypertension: Secondary | ICD-10-CM | POA: Diagnosis not present

## 2022-07-20 DIAGNOSIS — Z87440 Personal history of urinary (tract) infections: Secondary | ICD-10-CM | POA: Diagnosis not present

## 2022-07-20 DIAGNOSIS — Z96611 Presence of right artificial shoulder joint: Secondary | ICD-10-CM | POA: Diagnosis not present

## 2022-07-20 DIAGNOSIS — Z87442 Personal history of urinary calculi: Secondary | ICD-10-CM | POA: Diagnosis not present

## 2022-07-20 DIAGNOSIS — G8929 Other chronic pain: Secondary | ICD-10-CM | POA: Diagnosis not present

## 2022-07-20 DIAGNOSIS — F4323 Adjustment disorder with mixed anxiety and depressed mood: Secondary | ICD-10-CM | POA: Diagnosis not present

## 2022-07-20 DIAGNOSIS — Z6838 Body mass index (BMI) 38.0-38.9, adult: Secondary | ICD-10-CM | POA: Diagnosis not present

## 2022-07-20 DIAGNOSIS — S42202D Unspecified fracture of upper end of left humerus, subsequent encounter for fracture with routine healing: Secondary | ICD-10-CM | POA: Diagnosis not present

## 2022-07-22 DIAGNOSIS — E1169 Type 2 diabetes mellitus with other specified complication: Secondary | ICD-10-CM | POA: Diagnosis not present

## 2022-07-22 DIAGNOSIS — E785 Hyperlipidemia, unspecified: Secondary | ICD-10-CM | POA: Diagnosis not present

## 2022-07-22 DIAGNOSIS — F4323 Adjustment disorder with mixed anxiety and depressed mood: Secondary | ICD-10-CM | POA: Diagnosis not present

## 2022-07-22 DIAGNOSIS — Z87442 Personal history of urinary calculi: Secondary | ICD-10-CM

## 2022-07-22 DIAGNOSIS — Z96611 Presence of right artificial shoulder joint: Secondary | ICD-10-CM

## 2022-07-22 DIAGNOSIS — M545 Low back pain, unspecified: Secondary | ICD-10-CM | POA: Diagnosis not present

## 2022-07-22 DIAGNOSIS — Z87440 Personal history of urinary (tract) infections: Secondary | ICD-10-CM

## 2022-07-22 DIAGNOSIS — N3281 Overactive bladder: Secondary | ICD-10-CM | POA: Diagnosis not present

## 2022-07-22 DIAGNOSIS — K219 Gastro-esophageal reflux disease without esophagitis: Secondary | ICD-10-CM | POA: Diagnosis not present

## 2022-07-22 DIAGNOSIS — I10 Essential (primary) hypertension: Secondary | ICD-10-CM | POA: Diagnosis not present

## 2022-07-22 DIAGNOSIS — J45909 Unspecified asthma, uncomplicated: Secondary | ICD-10-CM | POA: Diagnosis not present

## 2022-07-22 DIAGNOSIS — Z9181 History of falling: Secondary | ICD-10-CM

## 2022-07-22 DIAGNOSIS — Z7984 Long term (current) use of oral hypoglycemic drugs: Secondary | ICD-10-CM

## 2022-07-22 DIAGNOSIS — F5101 Primary insomnia: Secondary | ICD-10-CM | POA: Diagnosis not present

## 2022-07-22 DIAGNOSIS — S42202D Unspecified fracture of upper end of left humerus, subsequent encounter for fracture with routine healing: Secondary | ICD-10-CM | POA: Diagnosis not present

## 2022-07-22 DIAGNOSIS — G8929 Other chronic pain: Secondary | ICD-10-CM | POA: Diagnosis not present

## 2022-07-22 DIAGNOSIS — Z6838 Body mass index (BMI) 38.0-38.9, adult: Secondary | ICD-10-CM

## 2022-07-22 DIAGNOSIS — S42291D Other displaced fracture of upper end of right humerus, subsequent encounter for fracture with routine healing: Secondary | ICD-10-CM | POA: Diagnosis not present

## 2022-07-29 ENCOUNTER — Telehealth: Payer: Self-pay | Admitting: Family Medicine

## 2022-07-29 NOTE — Telephone Encounter (Signed)
Melissa from [facility] contacted the office. Wanted to inform Dr. Milinda Antis that patient had missed 2 occupational therapy appointments last week. Please advise, thank you.

## 2022-07-29 NOTE — Telephone Encounter (Signed)
She is experiencing grief reaction after losing a pet- difficult for her to motivate I am aware  Hope it will improve Thanks for the heads up

## 2022-08-05 DIAGNOSIS — E785 Hyperlipidemia, unspecified: Secondary | ICD-10-CM | POA: Diagnosis not present

## 2022-08-05 DIAGNOSIS — J45909 Unspecified asthma, uncomplicated: Secondary | ICD-10-CM | POA: Diagnosis not present

## 2022-08-05 DIAGNOSIS — G8929 Other chronic pain: Secondary | ICD-10-CM | POA: Diagnosis not present

## 2022-08-05 DIAGNOSIS — S42202D Unspecified fracture of upper end of left humerus, subsequent encounter for fracture with routine healing: Secondary | ICD-10-CM | POA: Diagnosis not present

## 2022-08-05 DIAGNOSIS — Z6838 Body mass index (BMI) 38.0-38.9, adult: Secondary | ICD-10-CM | POA: Diagnosis not present

## 2022-08-05 DIAGNOSIS — F4323 Adjustment disorder with mixed anxiety and depressed mood: Secondary | ICD-10-CM | POA: Diagnosis not present

## 2022-08-05 DIAGNOSIS — Z87440 Personal history of urinary (tract) infections: Secondary | ICD-10-CM | POA: Diagnosis not present

## 2022-08-05 DIAGNOSIS — E1169 Type 2 diabetes mellitus with other specified complication: Secondary | ICD-10-CM | POA: Diagnosis not present

## 2022-08-05 DIAGNOSIS — N3281 Overactive bladder: Secondary | ICD-10-CM | POA: Diagnosis not present

## 2022-08-05 DIAGNOSIS — Z87442 Personal history of urinary calculi: Secondary | ICD-10-CM | POA: Diagnosis not present

## 2022-08-05 DIAGNOSIS — Z7984 Long term (current) use of oral hypoglycemic drugs: Secondary | ICD-10-CM | POA: Diagnosis not present

## 2022-08-05 DIAGNOSIS — M545 Low back pain, unspecified: Secondary | ICD-10-CM | POA: Diagnosis not present

## 2022-08-05 DIAGNOSIS — I10 Essential (primary) hypertension: Secondary | ICD-10-CM | POA: Diagnosis not present

## 2022-08-05 DIAGNOSIS — K219 Gastro-esophageal reflux disease without esophagitis: Secondary | ICD-10-CM | POA: Diagnosis not present

## 2022-08-05 DIAGNOSIS — F5101 Primary insomnia: Secondary | ICD-10-CM | POA: Diagnosis not present

## 2022-08-05 DIAGNOSIS — Z96611 Presence of right artificial shoulder joint: Secondary | ICD-10-CM | POA: Diagnosis not present

## 2022-08-05 DIAGNOSIS — Z9181 History of falling: Secondary | ICD-10-CM | POA: Diagnosis not present

## 2022-08-05 DIAGNOSIS — S42291D Other displaced fracture of upper end of right humerus, subsequent encounter for fracture with routine healing: Secondary | ICD-10-CM | POA: Diagnosis not present

## 2022-08-10 DIAGNOSIS — J45909 Unspecified asthma, uncomplicated: Secondary | ICD-10-CM | POA: Diagnosis not present

## 2022-08-10 DIAGNOSIS — F5101 Primary insomnia: Secondary | ICD-10-CM | POA: Diagnosis not present

## 2022-08-10 DIAGNOSIS — N3281 Overactive bladder: Secondary | ICD-10-CM | POA: Diagnosis not present

## 2022-08-10 DIAGNOSIS — S42291D Other displaced fracture of upper end of right humerus, subsequent encounter for fracture with routine healing: Secondary | ICD-10-CM | POA: Diagnosis not present

## 2022-08-10 DIAGNOSIS — I10 Essential (primary) hypertension: Secondary | ICD-10-CM | POA: Diagnosis not present

## 2022-08-10 DIAGNOSIS — K219 Gastro-esophageal reflux disease without esophagitis: Secondary | ICD-10-CM | POA: Diagnosis not present

## 2022-08-10 DIAGNOSIS — F4323 Adjustment disorder with mixed anxiety and depressed mood: Secondary | ICD-10-CM | POA: Diagnosis not present

## 2022-08-10 DIAGNOSIS — Z7984 Long term (current) use of oral hypoglycemic drugs: Secondary | ICD-10-CM | POA: Diagnosis not present

## 2022-08-10 DIAGNOSIS — Z87442 Personal history of urinary calculi: Secondary | ICD-10-CM | POA: Diagnosis not present

## 2022-08-10 DIAGNOSIS — Z96611 Presence of right artificial shoulder joint: Secondary | ICD-10-CM | POA: Diagnosis not present

## 2022-08-10 DIAGNOSIS — Z9181 History of falling: Secondary | ICD-10-CM | POA: Diagnosis not present

## 2022-08-10 DIAGNOSIS — G8929 Other chronic pain: Secondary | ICD-10-CM | POA: Diagnosis not present

## 2022-08-10 DIAGNOSIS — Z6838 Body mass index (BMI) 38.0-38.9, adult: Secondary | ICD-10-CM | POA: Diagnosis not present

## 2022-08-10 DIAGNOSIS — M545 Low back pain, unspecified: Secondary | ICD-10-CM | POA: Diagnosis not present

## 2022-08-10 DIAGNOSIS — S42202D Unspecified fracture of upper end of left humerus, subsequent encounter for fracture with routine healing: Secondary | ICD-10-CM | POA: Diagnosis not present

## 2022-08-10 DIAGNOSIS — E785 Hyperlipidemia, unspecified: Secondary | ICD-10-CM | POA: Diagnosis not present

## 2022-08-10 DIAGNOSIS — Z87440 Personal history of urinary (tract) infections: Secondary | ICD-10-CM | POA: Diagnosis not present

## 2022-08-10 DIAGNOSIS — E1169 Type 2 diabetes mellitus with other specified complication: Secondary | ICD-10-CM | POA: Diagnosis not present

## 2022-08-15 DIAGNOSIS — Z9181 History of falling: Secondary | ICD-10-CM | POA: Diagnosis not present

## 2022-08-15 DIAGNOSIS — Z87440 Personal history of urinary (tract) infections: Secondary | ICD-10-CM | POA: Diagnosis not present

## 2022-08-15 DIAGNOSIS — G8929 Other chronic pain: Secondary | ICD-10-CM | POA: Diagnosis not present

## 2022-08-15 DIAGNOSIS — F4323 Adjustment disorder with mixed anxiety and depressed mood: Secondary | ICD-10-CM | POA: Diagnosis not present

## 2022-08-15 DIAGNOSIS — S42202D Unspecified fracture of upper end of left humerus, subsequent encounter for fracture with routine healing: Secondary | ICD-10-CM | POA: Diagnosis not present

## 2022-08-15 DIAGNOSIS — N3281 Overactive bladder: Secondary | ICD-10-CM | POA: Diagnosis not present

## 2022-08-15 DIAGNOSIS — I10 Essential (primary) hypertension: Secondary | ICD-10-CM | POA: Diagnosis not present

## 2022-08-15 DIAGNOSIS — S42291D Other displaced fracture of upper end of right humerus, subsequent encounter for fracture with routine healing: Secondary | ICD-10-CM | POA: Diagnosis not present

## 2022-08-15 DIAGNOSIS — E785 Hyperlipidemia, unspecified: Secondary | ICD-10-CM | POA: Diagnosis not present

## 2022-08-15 DIAGNOSIS — F5101 Primary insomnia: Secondary | ICD-10-CM | POA: Diagnosis not present

## 2022-08-15 DIAGNOSIS — E1169 Type 2 diabetes mellitus with other specified complication: Secondary | ICD-10-CM | POA: Diagnosis not present

## 2022-08-15 DIAGNOSIS — Z87442 Personal history of urinary calculi: Secondary | ICD-10-CM | POA: Diagnosis not present

## 2022-08-15 DIAGNOSIS — J45909 Unspecified asthma, uncomplicated: Secondary | ICD-10-CM | POA: Diagnosis not present

## 2022-08-15 DIAGNOSIS — M545 Low back pain, unspecified: Secondary | ICD-10-CM | POA: Diagnosis not present

## 2022-08-15 DIAGNOSIS — Z7984 Long term (current) use of oral hypoglycemic drugs: Secondary | ICD-10-CM | POA: Diagnosis not present

## 2022-08-15 DIAGNOSIS — Z6838 Body mass index (BMI) 38.0-38.9, adult: Secondary | ICD-10-CM | POA: Diagnosis not present

## 2022-08-15 DIAGNOSIS — Z96611 Presence of right artificial shoulder joint: Secondary | ICD-10-CM | POA: Diagnosis not present

## 2022-08-15 DIAGNOSIS — K219 Gastro-esophageal reflux disease without esophagitis: Secondary | ICD-10-CM | POA: Diagnosis not present

## 2022-08-17 ENCOUNTER — Ambulatory Visit (INDEPENDENT_AMBULATORY_CARE_PROVIDER_SITE_OTHER): Payer: Medicare HMO | Admitting: Family Medicine

## 2022-08-17 ENCOUNTER — Encounter: Payer: Self-pay | Admitting: Family Medicine

## 2022-08-17 VITALS — BP 128/70 | HR 85 | Temp 97.6°F | Ht 60.0 in | Wt 172.0 lb

## 2022-08-17 DIAGNOSIS — N3941 Urge incontinence: Secondary | ICD-10-CM | POA: Diagnosis not present

## 2022-08-17 DIAGNOSIS — E1169 Type 2 diabetes mellitus with other specified complication: Secondary | ICD-10-CM | POA: Diagnosis not present

## 2022-08-17 DIAGNOSIS — E785 Hyperlipidemia, unspecified: Secondary | ICD-10-CM

## 2022-08-17 DIAGNOSIS — F4321 Adjustment disorder with depressed mood: Secondary | ICD-10-CM

## 2022-08-17 DIAGNOSIS — R7303 Prediabetes: Secondary | ICD-10-CM

## 2022-08-17 MED ORDER — SOLIFENACIN SUCCINATE 10 MG PO TABS
10.0000 mg | ORAL_TABLET | Freq: Every day | ORAL | 3 refills | Status: DC
Start: 2022-08-17 — End: 2023-06-19

## 2022-08-17 MED ORDER — FLUOXETINE HCL 20 MG PO CAPS: 20.0000 mg | ORAL_CAPSULE | Freq: Every day | ORAL | 2 refills | Status: DC

## 2022-08-17 NOTE — Assessment & Plan Note (Signed)
Pt wants to see if vesicare is covered with current insurance It worked well in the past  Tolerated well  Sent vesicalre 10 mg to pharmacy  Discussed possible side effects/see avs

## 2022-08-17 NOTE — Assessment & Plan Note (Signed)
Some gradual improvement with fluoxetine 20 mg daily  Less tearful  Able to function better  More social and interacts with other people's pets   Continues trazodone for sleep prn   Encouraged good self care  Declined counseling referral

## 2022-08-17 NOTE — Assessment & Plan Note (Signed)
Disc goals for lipids and reasons to control them Rev last labs with pt Rev low sat fat diet in detail Plan to continue simvastatin 20 mg daily and work on diet for this and wt loss  

## 2022-08-17 NOTE — Patient Instructions (Addendum)
Continue current medicines  If you want some counseling let us know  Be social as much as possible   I sent in vesicare for your overactive bladder  The pharmacy can let me know if it is not covered If side effects (dry mouth, constipation, dizziness)   Be careful    Stop up front to schedule your annual exam
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
## 2022-08-17 NOTE — Assessment & Plan Note (Signed)
Lab Results  Component Value Date   HGBA1C 5.2 03/28/2022   No longer taking metformin

## 2022-09-02 ENCOUNTER — Other Ambulatory Visit: Payer: Self-pay | Admitting: Family Medicine

## 2022-09-16 ENCOUNTER — Other Ambulatory Visit: Payer: Self-pay | Admitting: Family Medicine

## 2022-10-01 ENCOUNTER — Other Ambulatory Visit: Payer: Self-pay | Admitting: Family Medicine

## 2022-10-02 ENCOUNTER — Other Ambulatory Visit: Payer: Self-pay | Admitting: Family Medicine

## 2022-10-28 ENCOUNTER — Other Ambulatory Visit: Payer: Self-pay | Admitting: Family Medicine

## 2022-11-01 ENCOUNTER — Other Ambulatory Visit: Payer: Self-pay | Admitting: Family Medicine

## 2022-11-03 ENCOUNTER — Ambulatory Visit (INDEPENDENT_AMBULATORY_CARE_PROVIDER_SITE_OTHER): Payer: Medicare HMO | Admitting: Internal Medicine

## 2022-11-03 ENCOUNTER — Encounter: Payer: Self-pay | Admitting: Internal Medicine

## 2022-11-03 VITALS — BP 118/74 | HR 67 | Temp 97.1°F | Ht 60.0 in | Wt 173.0 lb

## 2022-11-03 DIAGNOSIS — J069 Acute upper respiratory infection, unspecified: Secondary | ICD-10-CM

## 2022-11-03 LAB — POC COVID19 BINAXNOW: SARS Coronavirus 2 Ag: NEGATIVE

## 2022-11-03 MED ORDER — BENZONATATE 200 MG PO CAPS: 200.0000 mg | ORAL_CAPSULE | Freq: Three times a day (TID) | ORAL | 0 refills | Status: DC | PRN

## 2022-11-03 NOTE — Assessment & Plan Note (Signed)
COVID negative Discussed tylenol and OTC cough syrup Benzonatate Rx as well Notify me if not improving by next week
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
## 2022-11-03 NOTE — Addendum Note (Signed)
Addended by: Eual Fines on: 11/03/2022 02:41 PM   Modules accepted: Orders

## 2022-11-09 ENCOUNTER — Encounter: Payer: Self-pay | Admitting: Internal Medicine

## 2022-11-09 ENCOUNTER — Ambulatory Visit (INDEPENDENT_AMBULATORY_CARE_PROVIDER_SITE_OTHER): Payer: Medicare HMO | Admitting: Internal Medicine

## 2022-11-09 VITALS — BP 130/60 | HR 78 | Temp 97.6°F | Ht 60.0 in | Wt 173.0 lb

## 2022-11-09 DIAGNOSIS — J0100 Acute maxillary sinusitis, unspecified: Secondary | ICD-10-CM | POA: Diagnosis not present

## 2022-11-09 MED ORDER — AMOXICILLIN-POT CLAVULANATE 875-125 MG PO TABS: 1.0000 | ORAL_TABLET | Freq: Two times a day (BID) | ORAL | 0 refills | Status: DC

## 2022-11-09 MED ORDER — HYDROCODONE BIT-HOMATROP MBR 5-1.5 MG/5ML PO SOLN: 5.0000 mL | Freq: Every evening | ORAL | 0 refills | Status: DC | PRN
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
## 2022-11-09 NOTE — Assessment & Plan Note (Signed)
Persistent symptoms over 10 days now Will treat with augmentin 875 bid x 7 days Hycodan for cough--just at bedtime analgesics

## 2022-11-13 ENCOUNTER — Other Ambulatory Visit: Payer: Self-pay | Admitting: Family Medicine

## 2022-11-22 ENCOUNTER — Ambulatory Visit (INDEPENDENT_AMBULATORY_CARE_PROVIDER_SITE_OTHER): Payer: Medicare HMO

## 2022-11-22 VITALS — Ht 60.0 in | Wt 173.0 lb

## 2022-11-22 DIAGNOSIS — Z0000 Encounter for general adult medical examination without abnormal findings: Secondary | ICD-10-CM

## 2022-11-22 NOTE — Patient Instructions (Signed)
Ms. Cindy Whitehead , Thank you for taking time to come for your Medicare Wellness Visit. I appreciate your ongoing commitment to your health goals. Please review the following plan we discussed and let me know if I can assist you in the future.   Referrals/Orders/Follow-Ups/Clinician Recommendations: Remember that you are due for a 2nd Shingles vaccine.  Also remember to discuss a mammogram and a bone density screening with Dr. Milinda Antis during your next visit.  Ask her for a recommendation to another eye doctor.  And lastly, remember to schedule a Medicare annual wellness virtual visit for next year after your visit with Dr. Milinda Antis in October.  Each day, aim for 6 glasses of water, plenty of protein in your diet and try to get up and walk/ stretch every hour for 5-10 minutes at a time.    This is a list of the screening recommended for you and due dates:  Health Maintenance  Topic Date Due   Zoster (Shingles) Vaccine (2 of 2) 09/29/2017   Hemoglobin A1C  09/26/2022   COVID-19 Vaccine (5 - 2023-24 season) 11/06/2022   Yearly kidney health urinalysis for diabetes  12/01/2022   Mammogram  12/08/2022*   Flu Shot  06/05/2023*   Complete foot exam   12/08/2022   Eye exam for diabetics  12/17/2022   Yearly kidney function blood test for diabetes  03/29/2023   Medicare Annual Wellness Visit  11/22/2023   DTaP/Tdap/Td vaccine (3 - Tdap) 10/18/2026   Pneumonia Vaccine  Completed   DEXA scan (bone density measurement)  Completed   HPV Vaccine  Aged Out  *Topic was postponed. The date shown is not the original due date.    Advanced directives: (Copy Requested) Please bring a copy of your health care power of attorney and living will to the office to be added to your chart at your convenience.  Next Medicare Annual Wellness Visit scheduled for next year: No
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
## 2022-12-05 ENCOUNTER — Telehealth: Payer: Self-pay | Admitting: Family Medicine

## 2022-12-05 DIAGNOSIS — R7303 Prediabetes: Secondary | ICD-10-CM

## 2022-12-05 DIAGNOSIS — I10 Essential (primary) hypertension: Secondary | ICD-10-CM

## 2022-12-05 DIAGNOSIS — E559 Vitamin D deficiency, unspecified: Secondary | ICD-10-CM

## 2022-12-05 NOTE — Telephone Encounter (Signed)
-----   Message from Alvina Chou sent at 11/22/2022  3:09 PM EDT ----- Regarding: Lab orders for Tu, 10.2.24 Patient is scheduled for CPX labs, please order future labs, Thanks , Camelia Eng

## 2022-12-07 ENCOUNTER — Other Ambulatory Visit (INDEPENDENT_AMBULATORY_CARE_PROVIDER_SITE_OTHER): Payer: Medicare HMO

## 2022-12-07 DIAGNOSIS — E559 Vitamin D deficiency, unspecified: Secondary | ICD-10-CM

## 2022-12-07 DIAGNOSIS — I10 Essential (primary) hypertension: Secondary | ICD-10-CM

## 2022-12-07 DIAGNOSIS — R7303 Prediabetes: Secondary | ICD-10-CM

## 2022-12-07 LAB — COMPREHENSIVE METABOLIC PANEL
ALT: 13 U/L (ref 0–35)
AST: 25 U/L (ref 0–37)
Albumin: 4 g/dL (ref 3.5–5.2)
Alkaline Phosphatase: 72 U/L (ref 39–117)
BUN: 12 mg/dL (ref 6–23)
CO2: 27 meq/L (ref 19–32)
Calcium: 9.2 mg/dL (ref 8.4–10.5)
Chloride: 105 meq/L (ref 96–112)
Creatinine, Ser: 0.89 mg/dL (ref 0.40–1.20)
GFR: 59.25 mL/min — ABNORMAL LOW (ref >60.00)
Glucose, Bld: 117 mg/dL — ABNORMAL HIGH (ref 70–99)
Potassium: 4.4 meq/L (ref 3.5–5.1)
Sodium: 141 meq/L (ref 135–145)
Total Bilirubin: 1.1 mg/dL (ref 0.2–1.2)
Total Protein: 6.9 g/dL (ref 6.0–8.3)

## 2022-12-07 LAB — LIPID PANEL
Cholesterol: 201 mg/dL — ABNORMAL HIGH (ref 0–200)
HDL: 50.4 mg/dL (ref >39.00)
LDL Cholesterol: 115 mg/dL — ABNORMAL HIGH (ref 0–99)
NonHDL: 150.22
Total CHOL/HDL Ratio: 4
Triglycerides: 175 mg/dL — ABNORMAL HIGH (ref 0.0–149.0)
VLDL: 35 mg/dL (ref 0.0–40.0)

## 2022-12-07 LAB — CBC WITH DIFFERENTIAL/PLATELET
Basophils Absolute: 0.1 10*3/uL (ref 0.0–0.1)
Basophils Relative: 1 % (ref 0.0–3.0)
Eosinophils Absolute: 0.3 10*3/uL (ref 0.0–0.7)
Eosinophils Relative: 4.8 % (ref 0.0–5.0)
HCT: 42.9 % (ref 36.0–46.0)
Hemoglobin: 14.3 g/dL (ref 12.0–15.0)
Lymphocytes Relative: 28.3 % (ref 12.0–46.0)
Lymphs Abs: 1.7 10*3/uL (ref 0.7–4.0)
MCHC: 33.3 g/dL (ref 30.0–36.0)
MCV: 96.1 fL (ref 78.0–100.0)
Monocytes Absolute: 0.4 10*3/uL (ref 0.1–1.0)
Monocytes Relative: 6.2 % (ref 3.0–12.0)
Neutro Abs: 3.6 10*3/uL (ref 1.4–7.7)
Neutrophils Relative %: 59.7 % (ref 43.0–77.0)
Platelets: 191 10*3/uL (ref 150.0–400.0)
RBC: 4.47 Mil/uL (ref 3.87–5.11)
RDW: 13.1 % (ref 11.5–15.5)
WBC: 6 10*3/uL (ref 4.0–10.5)

## 2022-12-07 LAB — VITAMIN D 25 HYDROXY (VIT D DEFICIENCY, FRACTURES): VITD: 28.6 ng/mL — ABNORMAL LOW (ref 30.00–100.00)

## 2022-12-07 LAB — HEMOGLOBIN A1C: Hgb A1c MFr Bld: 5.7 % (ref 4.6–6.5)

## 2022-12-07 LAB — TSH: TSH: 3.07 u[IU]/mL (ref 0.35–5.50)

## 2022-12-14 ENCOUNTER — Encounter: Payer: Medicare HMO | Admitting: Family Medicine
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
## 2022-12-15 ENCOUNTER — Encounter: Payer: Self-pay | Admitting: *Deleted

## 2022-12-15 ENCOUNTER — Encounter: Payer: Self-pay | Admitting: Family Medicine

## 2022-12-15 ENCOUNTER — Ambulatory Visit (INDEPENDENT_AMBULATORY_CARE_PROVIDER_SITE_OTHER): Payer: Medicare HMO | Admitting: Family Medicine

## 2022-12-15 VITALS — BP 136/84 | HR 74 | Temp 98.4°F | Ht <= 58 in | Wt 166.0 lb

## 2022-12-15 DIAGNOSIS — D508 Other iron deficiency anemias: Secondary | ICD-10-CM

## 2022-12-15 DIAGNOSIS — H9313 Tinnitus, bilateral: Secondary | ICD-10-CM

## 2022-12-15 DIAGNOSIS — E7800 Pure hypercholesterolemia, unspecified: Secondary | ICD-10-CM | POA: Diagnosis not present

## 2022-12-15 DIAGNOSIS — M8589 Other specified disorders of bone density and structure, multiple sites: Secondary | ICD-10-CM | POA: Diagnosis not present

## 2022-12-15 DIAGNOSIS — E66811 Obesity, class 1: Secondary | ICD-10-CM | POA: Diagnosis not present

## 2022-12-15 DIAGNOSIS — I10 Essential (primary) hypertension: Secondary | ICD-10-CM | POA: Diagnosis not present

## 2022-12-15 DIAGNOSIS — F4323 Adjustment disorder with mixed anxiety and depressed mood: Secondary | ICD-10-CM

## 2022-12-15 DIAGNOSIS — F4321 Adjustment disorder with depressed mood: Secondary | ICD-10-CM

## 2022-12-15 DIAGNOSIS — R7303 Prediabetes: Secondary | ICD-10-CM | POA: Diagnosis not present

## 2022-12-15 DIAGNOSIS — E559 Vitamin D deficiency, unspecified: Secondary | ICD-10-CM | POA: Diagnosis not present

## 2022-12-15 DIAGNOSIS — Z800 Family history of malignant neoplasm of digestive organs: Secondary | ICD-10-CM

## 2022-12-15 DIAGNOSIS — Z0000 Encounter for general adult medical examination without abnormal findings: Secondary | ICD-10-CM

## 2022-12-15 MED ORDER — METFORMIN HCL 500 MG PO TABS: 250.0000 mg | ORAL_TABLET | Freq: Two times a day (BID) | ORAL | 3 refills | Status: DC

## 2022-12-15 MED ORDER — FLUOXETINE HCL 20 MG PO CAPS: 20.0000 mg | ORAL_CAPSULE | Freq: Every day | ORAL | 2 refills | Status: DC

## 2022-12-15 NOTE — Assessment & Plan Note (Signed)
Last vitamin D Lab Results  Component Value Date   VD25OH 28.60 (L) 12/07/2022   Will increase D3 to 4000 international units daily for bone and overall health

## 2022-12-15 NOTE — Assessment & Plan Note (Signed)
Resolved Lab Results  Component Value Date   WBC 6.0 12/07/2022   HGB 14.3 12/07/2022   HCT 42.9 12/07/2022   MCV 96.1 12/07/2022   PLT 191.0 12/07/2022

## 2022-12-15 NOTE — Assessment & Plan Note (Signed)
Pt is interested in 5 y recall colonoscopy and she is healthy enough to do it  GI ref done to [facility]

## 2022-12-15 NOTE — Assessment & Plan Note (Signed)
Overdue for dexa Will schedule at her gyn  Discussed fall prevention, supplements and exercise for bone density  Had 2 humerus fractures Likely needs treatment  Will encourage increase vit D 3 intake to 4000 international units

## 2022-12-15 NOTE — Assessment & Plan Note (Signed)
Fluoxetine is helpful  Grief is slowly improving Reviewed stressors/ coping techniques/symptoms/ support sources/ tx options and side effects in detail today   Will continue this  Trazodone for sleep prn

## 2022-12-15 NOTE — Assessment & Plan Note (Signed)
Pt needed bp medicine briefly in SNF (rehab) after arm fractures  Bp may have been up due to pain   BP: 136/84  Now well controlled w/o medication  Will continue to monitor

## 2022-12-15 NOTE — Assessment & Plan Note (Signed)
Gradually improving Continues fluoxetine 20 mg daily

## 2022-12-15 NOTE — Patient Instructions (Addendum)
Go to your pharmacy for the 2nd shingles vaccine / shingrix   Schedule your g[facility] visit at [facility]  I think you are due for both mammogram and dexa (bone density test) there  Let us know if you have any problems getting an appointment   I will put in a referral for colonoscopy for [facility]  You can call their office  Please let us know if you don't hear in 1-2 weeks   Your vitamin d level is low  Increase your vitamin D3 over the counter to 4000 international units daily    Keep walking Add some strength training to your routine, this is important for bone and brain health and can reduce your risk of falls and help your body use insulin properly and regulate weight  Light weights, exercise bands , and internet videos are a good way to start  Yoga (chair or regular), machines , floor exercises or a gym with machines are also good options   To prevent diabetes Try to get most of your carbohydrates from produce (with the exception of white potatoes) and whole grains Eat less bread/pasta/rice/snack foods/cereals/sweets and other items from the middle of the grocery store (processed carbs)  Start back on metformin for help with blood sugar and weight loss   For cholesterol Avoid red meat/ fried foods/ egg yolks/ fatty breakfast meats/ butter, cheese and high fat dairy/ and shellfish     Schedule fasting labs then follow up in 3 months

## 2022-12-15 NOTE — Assessment & Plan Note (Signed)
Disc goals for lipids and reasons to control them Rev last labs with pt Rev low sat fat diet in detail   LDL is up  Will watch diet more closely  Follow up 3 mo   Continue simvastatin 10 mg daily

## 2022-12-15 NOTE — Assessment & Plan Note (Signed)
Lab Results  Component Value Date   HGBA1C 5.7 12/07/2022   Interested in going back on metformin for this and weight Prescription 250 mg bid  Will call if side effects Follow up for lab and visit in 3 mo

## 2022-12-15 NOTE — Assessment & Plan Note (Signed)
Discussed how this problem influences overall health and the risks it imposes  Reviewed plan for weight loss with lower calorie diet (via better food choices (lower glycemic and portion control) along with exercise building up to or more than 30 minutes 5 days per week including some aerobic activity and strength training    Plan to start back on low dose metformin for weight and prediabetes

## 2022-12-15 NOTE — Assessment & Plan Note (Signed)
Needs hearing testing Pt aware and considering

## 2022-12-15 NOTE — Assessment & Plan Note (Signed)
Reviewed health habits including diet and exercise and skin cancer prevention Reviewed appropriate screening tests for age  Also reviewed health mt list, fam hx and immunization status , as well as social and family history   See HPI Labs reviewed and ordered Plans to get 2nd shingrix at pharmacy  Plans to follow up with gyn for visit and mammogram and dexa (due for all) Discussed fall prevention, supplements and exercise for bone density  In setting of bilat arm fracture / may need to discuss treatment for osteoporosis  Will increase D3 intake PHQ 0, improved

## 2023-03-10 ENCOUNTER — Other Ambulatory Visit: Payer: Medicare HMO

## 2023-03-16 ENCOUNTER — Other Ambulatory Visit (INDEPENDENT_AMBULATORY_CARE_PROVIDER_SITE_OTHER): Payer: Medicare HMO

## 2023-03-16 DIAGNOSIS — I10 Essential (primary) hypertension: Secondary | ICD-10-CM | POA: Diagnosis not present

## 2023-03-16 DIAGNOSIS — E7800 Pure hypercholesterolemia, unspecified: Secondary | ICD-10-CM | POA: Diagnosis not present

## 2023-03-16 DIAGNOSIS — R7303 Prediabetes: Secondary | ICD-10-CM | POA: Diagnosis not present

## 2023-03-16 LAB — COMPREHENSIVE METABOLIC PANEL
ALT: 15 U/L (ref 0–35)
AST: 26 U/L (ref 0–37)
Albumin: 3.9 g/dL (ref 3.5–5.2)
Alkaline Phosphatase: 87 U/L (ref 39–117)
BUN: 19 mg/dL (ref 6–23)
CO2: 27 meq/L (ref 19–32)
Calcium: 9.2 mg/dL (ref 8.4–10.5)
Chloride: 109 meq/L (ref 96–112)
Creatinine, Ser: 0.74 mg/dL (ref 0.40–1.20)
GFR: 73.8 mL/min (ref >60.00)
Glucose, Bld: 128 mg/dL — ABNORMAL HIGH (ref 70–99)
Potassium: 4.3 meq/L (ref 3.5–5.1)
Sodium: 142 meq/L (ref 135–145)
Total Bilirubin: 0.6 mg/dL (ref 0.2–1.2)
Total Protein: 6.9 g/dL (ref 6.0–8.3)

## 2023-03-16 LAB — LIPID PANEL
Cholesterol: 196 mg/dL (ref 0–200)
HDL: 45.5 mg/dL (ref >39.00)
LDL Cholesterol: 111 mg/dL — ABNORMAL HIGH (ref 0–99)
NonHDL: 150.58
Total CHOL/HDL Ratio: 4
Triglycerides: 196 mg/dL — ABNORMAL HIGH (ref 0.0–149.0)
VLDL: 39.2 mg/dL (ref 0.0–40.0)

## 2023-03-16 LAB — HEMOGLOBIN A1C: Hgb A1c MFr Bld: 6.1 % (ref 4.6–6.5)

## 2023-03-17 ENCOUNTER — Ambulatory Visit: Payer: Medicare HMO | Admitting: Family Medicine

## 2023-03-21 ENCOUNTER — Encounter: Payer: Self-pay | Admitting: Family Medicine

## 2023-03-21 ENCOUNTER — Ambulatory Visit (INDEPENDENT_AMBULATORY_CARE_PROVIDER_SITE_OTHER): Payer: Medicare HMO | Admitting: Family Medicine

## 2023-03-21 VITALS — BP 136/82 | HR 80 | Temp 97.6°F | Ht <= 58 in | Wt 170.2 lb

## 2023-03-21 DIAGNOSIS — E7800 Pure hypercholesterolemia, unspecified: Secondary | ICD-10-CM

## 2023-03-21 DIAGNOSIS — Z23 Encounter for immunization: Secondary | ICD-10-CM

## 2023-03-21 DIAGNOSIS — I10 Essential (primary) hypertension: Secondary | ICD-10-CM | POA: Diagnosis not present

## 2023-03-21 DIAGNOSIS — F4323 Adjustment disorder with mixed anxiety and depressed mood: Secondary | ICD-10-CM

## 2023-03-21 DIAGNOSIS — R7303 Prediabetes: Secondary | ICD-10-CM | POA: Diagnosis not present

## 2023-03-21 DIAGNOSIS — E66811 Obesity, class 1: Secondary | ICD-10-CM | POA: Diagnosis not present

## 2023-03-21 MED ORDER — METFORMIN HCL 500 MG PO TABS: 500.0000 mg | ORAL_TABLET | Freq: Two times a day (BID) | ORAL | 3 refills | Status: DC

## 2023-03-21 MED ORDER — ROSUVASTATIN CALCIUM 5 MG PO TABS: 5.0000 mg | ORAL_TABLET | Freq: Every day | ORAL | 3 refills | Status: DC

## 2023-03-21 NOTE — Assessment & Plan Note (Signed)
 Pt needed bp medicine briefly in SNF (rehab) after arm fractures  Bp may have been up due to pain   BP: 136/82  Now fairly controlled w/o medication  Will continue to monitor

## 2023-03-21 NOTE — Assessment & Plan Note (Signed)
 Disc goals for lipids and reasons to control them Rev last labs with pt Rev low sat fat diet in detail LDL is 111 -not at goal  Plan to try change from simvastatin  20 to crestor  5  If well tolerated can increase dose in future  Will re check at follow up 3 mo
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
## 2023-03-21 NOTE — Assessment & Plan Note (Signed)
 Discussed how this problem influences overall health and the risks it imposes  Reviewed plan for weight loss with lower calorie diet (via better food choices (lower glycemic and portion control) along with exercise building up to or more than 30 minutes 5 days per week including some aerobic activity and strength training    Encouraged strongly to cut sweets and sugar drinks

## 2023-03-21 NOTE — Assessment & Plan Note (Signed)
Fluoxetine is helpful  Grief is slowly improving Reviewed stressors/ coping techniques/symptoms/ support sources/ tx options and side effects in detail today   Will continue this  Trazodone for sleep prn

## 2023-03-21 NOTE — Assessment & Plan Note (Signed)
 Lab Results  Component Value Date   HGBA1C 6.1 03/16/2023   HGBA1C 5.7 12/07/2022   HGBA1C 5.2 03/28/2022   This is worse despite starting metfomrin 250 mg bid  Eating much worse during holidays-sweets and sugar drinks  disc imp of low glycemic diet and wt loss to prevent DM2  Will increase metformin  to 500 mg bid  Follow up 3 mo

## 2023-03-21 NOTE — Patient Instructions (Addendum)
 Goal to prevent diabetes and loose weight  Try to get most of your carbohydrates from produce (with the exception of white potatoes) and whole grains Eat less bread/pasta/rice/snack foods/cereals/sweets and other items from the middle of the grocery store (processed carbs)   Get rid of any drinks with sugar  Sweet tea Sodas  Juice  Alcohol   Try and get lean protein  The following are examples of protein in diet  Meat  Fish  Eggs  Dairy products  Soy products  Oat milk  Almond milk Nuts and nut butters  Dried beans    Start some strength building exercise when you are ready  Add some strength training to your routine, this is important for bone and brain health and can reduce your risk of falls and help your body use insulin  properly and regulate weight  Light weights, exercise bands , and internet videos are a good way to start  Yoga (chair or regular), machines , floor exercises or a gym with machines are also good options   Let's switch simvastatin  to rosuvastatin  (crestor )  5 mg daily  If any side effects let us  know   Increase metformin  to 500 mg twice daily  If any side effects let us  know   Follow up in 3 month

## 2023-04-17 ENCOUNTER — Ambulatory Visit: Payer: Self-pay | Admitting: Family Medicine

## 2023-04-17 ENCOUNTER — Ambulatory Visit: Payer: Medicare HMO | Admitting: Family Medicine

## 2023-04-17 ENCOUNTER — Encounter: Payer: Self-pay | Admitting: Family Medicine

## 2023-04-17 ENCOUNTER — Other Ambulatory Visit: Payer: Self-pay | Admitting: Family Medicine

## 2023-04-17 ENCOUNTER — Telehealth: Payer: Self-pay | Admitting: *Deleted

## 2023-04-17 VITALS — BP 120/80 | HR 94 | Temp 98.7°F | Ht <= 58 in | Wt 167.0 lb

## 2023-04-17 DIAGNOSIS — U071 COVID-19: Secondary | ICD-10-CM | POA: Diagnosis not present

## 2023-04-17 DIAGNOSIS — J029 Acute pharyngitis, unspecified: Secondary | ICD-10-CM

## 2023-04-17 LAB — POC COVID19 BINAXNOW: SARS Coronavirus 2 Ag: POSITIVE — AB

## 2023-04-17 LAB — POCT INFLUENZA A/B
Influenza A, POC: NEGATIVE
Influenza B, POC: NEGATIVE

## 2023-04-17 MED ORDER — MOLNUPIRAVIR EUA 200MG CAPSULE
4.0000 | ORAL_CAPSULE | Freq: Two times a day (BID) | ORAL | 0 refills | Status: DC
Start: 2023-04-17 — End: 2023-04-17

## 2023-04-17 MED ORDER — NIRMATRELVIR/RITONAVIR (PAXLOVID)TABLET: 3.0000 | ORAL_TABLET | Freq: Two times a day (BID) | ORAL | 0 refills | Status: AC

## 2023-04-17 MED ORDER — HYDROCODONE BIT-HOMATROP MBR 5-1.5 MG/5ML PO SOLN
5.0000 mL | Freq: Three times a day (TID) | ORAL | 0 refills | Status: DC | PRN
Start: 2023-04-17 — End: 2023-06-19

## 2023-04-17 NOTE — Telephone Encounter (Signed)
 Chief Complaint: cough Symptoms: cough, vomiting with coughing, sore throat, weakness Frequency: since Friday Pertinent Negatives: Patient denies CP, SOB at rest Disposition: [] ED /[] Urgent Care (no appt availability in office) / [x] Appointment(In office/virtual)/ []  [facility]/ [] Home Care/ [] Refused Recommended Disposition /[] [facility]/ []  Follow-up with PCP Additional Notes: Pt reports cough, some weakness, sore throat, and vomiting with coughing spells beginning Friday. Pt states she felt a little weak this AM when she woke up and felt the same yesterday. Pt states she does not feel weak, dizzy, or lightheaded right now. Pt states she sometimes coughs to the point of vomiting, and during that time, experiences SOB. No SOB at rest when not coughing. Pt reports she is very congested. Denies fever. Denies CP. Per protocol, pt advised to be seen in the next 4 hours. No availability at [facility]. RN scheduled pt for 1140 today at [facility] as it close to her home. Pt agreeable to the plan. Pt states she will try to find someone to take her. RN advised  pt to please call 911 for difficulty breathing should that happen, pt verbalized understanding.   Copied from CRM [id number]. Topic: Clinical - Red Word Triage >> Apr 17, 2023  9:21 AM Jayson Michael wrote: [facility] that prompted transfer to Nurse Triage: WEAKNESS, persistent coughing that triggers vomiting, sore throat, shortness of breath. Symptoms present Friday and progressively gotten worse, patient has taken OTC Nyquil and the tessalon  pearls with no relief. Reason for Disposition  [1] MILD difficulty breathing (e.g., minimal/no SOB at rest, SOB with walking, pulse <100) AND [2] NEW-onset or WORSE than normal  Answer Assessment - Initial Assessment Questions 1. RESPIRATORY STATUS: "Describe your breathing?" (e.g., wheezing, shortness of breath, unable to speak, severe coughing)      "It is not real bad", "comes and goes, I  cough and cough until I have to throw up, and then I feel short of breath" 2. ONSET: "When did this breathing problem begin?"      Friday 3. PATTERN "Does the difficult breathing come and go, or has it been constant since it started?"      Comes and goes, worse with coughing spells and vomiting (does not vomit every time) 4. SEVERITY: "How bad is your breathing?" (e.g., mild, moderate, severe)    - MILD: No SOB at rest, mild SOB with walking, speaks normally in sentences, can lie down, no retractions, pulse < 100.    - MODERATE: SOB at rest, SOB with minimal exertion and prefers to sit, cannot lie down flat, speaks in phrases, mild retractions, audible wheezing, pulse 100-120.    - SEVERE: Very SOB at rest, speaks in single words, struggling to breathe, sitting hunched forward, retractions, pulse > 120      Mild - no SOB unless she is coughing 5. RECURRENT SYMPTOM: "Have you had difficulty breathing before?" If Yes, ask: "When was the last time?" and "What happened that time?"      No 6. CARDIAC HISTORY: "Do you have any history of heart disease?" (e.g., heart attack, angina, bypass surgery, angioplasty)      No 7. LUNG HISTORY: "Do you have any history of lung disease?"  (e.g., pulmonary embolus, asthma, emphysema)     No 8. CAUSE: "What do you think is causing the breathing problem?"      From coughing 9. OTHER SYMPTOMS: "Do you have any other symptoms? (e.g., dizziness, runny nose, cough, chest pain, fever)     Sore throat,  cough (non-productive), congestion, no fever. "I just feel kind of weak" -- "when I got up this AM, I felt weak. I felt weak yesterday, too." Denies lightheadedness, dizziness, then says she felt like that "a little" bit this AM. Denies feeling that way now. Denies CP. 10. O2 SATURATION MONITOR:  "Do you use an oxygen saturation monitor (pulse oximeter) at home?" If Yes, ask: "What is your reading (oxygen level) today?" "What is your usual oxygen saturation reading?" (e.g.,  95%)       Doesn't have one  Protocols used: Breathing Difficulty-A-AH

## 2023-04-17 NOTE — Telephone Encounter (Signed)
 Copied from CRM [id number]. Topic: Clinical - Prescription Issue >> Apr 17, 2023  2:20 PM Pattie Borders B wrote: Reason for CRM: Patient daughter, Idamae Maize [phone], calling because patient was diagnosed with Covid by PCP and medications were not at pharmacy when went to pick them up. Prior authorization from office needed.  HYDROcodone  bit-homatropine (HYCODAN) 5-1.5 MG/5ML syrup [[id number]]  molnupiravir  EUA (LAGEVRIO ) 200 mg CAPS capsule [[id number]]

## 2023-04-17 NOTE — Assessment & Plan Note (Signed)
 Patient's symptoms consistent with COVID in combination with positive COVID test.  Discussed treatment with COVID-specific medication.  Given her chronic medications Paxlovid  was contraindicated.  We will treat with molnupiravir .  Discussed the purpose of this medication is to help reduce her risk of progression to severe illness requiring hospitalization.  Discussed it may help her feel better quicker though it may not.  Advised to contact us  if she develops diarrhea with this.  We will treat her cough with Hycodan as codeine-based cough medications and dextromethorphan based cough medications are contraindicated with her current medication regimen.  Advised to stay at home until she is feeling better for at least 24 hours.  Discussed if she had to leave the house she should wear a mask through next Monday.  If she develops worsening symptoms she will be reevaluated.

## 2023-04-17 NOTE — Telephone Encounter (Signed)
 Routing to provider who eval pt and prior auth dpt

## 2023-04-17 NOTE — Telephone Encounter (Signed)
 Noted. Can you find out what the cost would be for the patient on this medication and let her know to see if it is too expensive for her? Please also check with the patient to see if she feels as though she could stop the vesicare  if we had to choose the other medication for covid. The vesicare  is the medication that interacted with paxlovid  and is why I choose the molnupiravir .      Please also see if the hycodan needs a PA as well.

## 2023-04-17 NOTE — Progress Notes (Signed)
 [name], MD Phone: [phone]  GERARDO GIAMMARINO is a 88 y.o. female who presents today for same-day visit.  Cough: Patient's onset of symptoms on Friday.  Has had cough and congestion.  Notes posttussive emesis of mucus.  Notes no fevers or postnasal drip.  Had sore throat.  At times she will feel short of breath at night.  Does report feeling weak overall at times particularly in the morning.  No COVID or flu exposure.  She has tried Tessalon  and NyQuil with no benefit for her cough.  Social History   Tobacco Use  Smoking Status Never  Smokeless Tobacco Never    Current Outpatient Medications on File Prior to Visit  Medication Sig Dispense Refill   FLUoxetine  (PROZAC ) 20 MG capsule Take 1 capsule (20 mg total) by mouth daily. Start this after finishing the 10 mg pills 90 capsule 2   Melatonin 10 MG TABS Take one by mouth daily at bedtime as needed     metFORMIN  (GLUCOPHAGE ) 500 MG tablet Take 1 tablet (500 mg total) by mouth 2 (two) times daily with a meal. 180 tablet 3   methocarbamol  (ROBAXIN ) 500 MG tablet TAKE 1 TABLET AT BEDTIME ASNEEDED FOR MUSCLE SPASMS 90 tablet 1   oxybutynin  (DITROPAN -XL) 10 MG 24 hr tablet Take 10 mg by mouth at bedtime.     rosuvastatin  (CRESTOR ) 5 MG tablet Take 1 tablet (5 mg total) by mouth daily. 90 tablet 3   solifenacin  (VESICARE ) 10 MG tablet Take 1 tablet (10 mg total) by mouth daily. 90 tablet 3   traZODone  (DESYREL ) 50 MG tablet Take 1/2 to 1 pill by mouth at bedtime as needed for sleep 90 tablet 3   No current facility-administered medications on file prior to visit.     ROS see history of present illness  Objective  Physical Exam Vitals:   04/17/23 1145  BP: 120/80  Pulse: 94  Temp: 98.7 F (37.1 C)  SpO2: 96%    BP Readings from Last 3 Encounters:  04/17/23 120/80  03/21/23 136/82  12/15/22 136/84   Wt Readings from Last 3 Encounters:  04/17/23 167 lb (75.8 kg)  03/21/23 170 lb 4 oz (77.2 kg)  12/15/22 166 lb (75.3  kg)    Physical Exam Constitutional:      General: She is not in acute distress.    Appearance: She is not diaphoretic.  HENT:     Mouth/Throat:     Mouth: Mucous membranes are moist.     Pharynx: Oropharynx is clear.  Cardiovascular:     Rate and Rhythm: Normal rate and regular rhythm.     Heart sounds: Normal heart sounds.  Pulmonary:     Effort: Pulmonary effort is normal.     Breath sounds: Normal breath sounds.  Skin:    General: Skin is warm and dry.  Neurological:     Mental Status: She is alert.      Assessment/Plan: Please see individual problem list.  COVID Assessment & Plan: Patient's symptoms consistent with COVID in combination with positive COVID test.  Discussed treatment with COVID-specific medication.  Given her chronic medications Paxlovid  was contraindicated.  We will treat with molnupiravir .  Discussed the purpose of this medication is to help reduce her risk of progression to severe illness requiring hospitalization.  Discussed it may help her feel better quicker though it may not.  Advised to contact us  if she develops diarrhea with this.  We will treat her cough with Hycodan as codeine-based  cough medications and dextromethorphan based cough medications are contraindicated with her current medication regimen.  Advised to stay at home until she is feeling better for at least 24 hours.  Discussed if she had to leave the house she should wear a mask through next Monday.  If she develops worsening symptoms she will be reevaluated.  Orders: -     molnupiravir  EUA; Take 4 capsules (800 mg total) by mouth 2 (two) times daily for 5 days.  Dispense: 40 capsule; Refill: 0 -     HYDROcodone  Bit-Homatrop MBr; Take 5 mLs by mouth every 8 (eight) hours as needed for cough.  Dispense: 120 mL; Refill: 0  Sore throat -     POC COVID-19 BinaxNow -     POCT Influenza A/B     Return if symptoms worsen or fail to improve.   [name], MD [facility]

## 2023-04-17 NOTE — Patient Instructions (Addendum)
 Nice to see you. You have COVID-19. Please stay home until you are feeling better for at least 24 hours. If you have to leave your house please wear a mask through next Monday. We are treating you with molnupiravir  for COVID.  If you notice any side effects please let us  know. I am treating you with hydrocodone  cough medication.  If you get excessively drowsy with this please discontinue use and to contact us . If you are not feeling better by early next week please let us  know.  If you start to feel worse or you develop worsening shortness of breath, cough productive of blood, or fevers please seek medical attention immediately.

## 2023-04-17 NOTE — Telephone Encounter (Signed)
 Noted.

## 2023-04-17 NOTE — Telephone Encounter (Signed)
 Paxlovid  sent to pharmacy. Mychart message sent to patient to reiterate the instructions regarding stopping the vesicare  while on paxlovid  and stopping crestor  while on paxlovid . These were verbally relayed to the patients daughter by Creola Doheny CMA.

## 2023-04-17 NOTE — Telephone Encounter (Signed)
 Noted. Can you find out what the cost would be for the patient on this medication and let her know to see if it is too expensive for her? Please also check with the patient to see if she feels as though she could stop the vesicare  if we had to choose the other medication for covid. The vesicare  is the medication that interacted with paxlovid  and is why I choose the molnupiravir .

## 2023-04-19 ENCOUNTER — Other Ambulatory Visit: Payer: Self-pay | Admitting: Family Medicine

## 2023-06-19 ENCOUNTER — Ambulatory Visit (INDEPENDENT_AMBULATORY_CARE_PROVIDER_SITE_OTHER): Payer: Medicare HMO | Admitting: Family Medicine

## 2023-06-19 ENCOUNTER — Encounter: Payer: Self-pay | Admitting: Family Medicine

## 2023-06-19 VITALS — BP 130/71 | HR 90 | Temp 98.2°F | Ht <= 58 in | Wt 163.4 lb

## 2023-06-19 DIAGNOSIS — R7303 Prediabetes: Secondary | ICD-10-CM

## 2023-06-19 DIAGNOSIS — E7800 Pure hypercholesterolemia, unspecified: Secondary | ICD-10-CM | POA: Diagnosis not present

## 2023-06-19 DIAGNOSIS — I10 Essential (primary) hypertension: Secondary | ICD-10-CM | POA: Diagnosis not present

## 2023-06-19 DIAGNOSIS — E66811 Obesity, class 1: Secondary | ICD-10-CM | POA: Diagnosis not present

## 2023-06-19 DIAGNOSIS — F4320 Adjustment disorder, unspecified: Secondary | ICD-10-CM

## 2023-06-19 LAB — COMPREHENSIVE METABOLIC PANEL WITH GFR
ALT: 19 U/L (ref 0–35)
AST: 29 U/L (ref 0–37)
Albumin: 4 g/dL (ref 3.5–5.2)
Alkaline Phosphatase: 67 U/L (ref 39–117)
BUN: 17 mg/dL (ref 6–23)
CO2: 28 meq/L (ref 19–32)
Calcium: 9 mg/dL (ref 8.4–10.5)
Chloride: 103 meq/L (ref 96–112)
Creatinine, Ser: 0.89 mg/dL (ref 0.40–1.20)
GFR: 59.03 mL/min — ABNORMAL LOW (ref >60.00)
Glucose, Bld: 117 mg/dL — ABNORMAL HIGH (ref 70–99)
Potassium: 4 meq/L (ref 3.5–5.1)
Sodium: 140 meq/L (ref 135–145)
Total Bilirubin: 1.1 mg/dL (ref 0.2–1.2)
Total Protein: 6.6 g/dL (ref 6.0–8.3)

## 2023-06-19 LAB — LIPID PANEL
Cholesterol: 123 mg/dL (ref 0–200)
HDL: 52.6 mg/dL (ref >39.00)
LDL Cholesterol: 46 mg/dL (ref 0–99)
NonHDL: 69.9
Total CHOL/HDL Ratio: 2
Triglycerides: 120 mg/dL (ref 0.0–149.0)
VLDL: 24 mg/dL (ref 0.0–40.0)

## 2023-06-19 LAB — POCT GLYCOSYLATED HEMOGLOBIN (HGB A1C): Hemoglobin A1C: 5.6 % (ref 4.0–5.6)

## 2023-06-19 NOTE — Assessment & Plan Note (Signed)
 Discussed how this problem influences overall health and the risks it imposes  Reviewed plan for weight loss with lower calorie diet (via better food choices (lower glycemic and portion control) along with exercise building up to or more than 30 minutes 5 days per week including some aerobic activity and strength training    Commended 4 lb weight loss   Continues metformin

## 2023-06-19 NOTE — Patient Instructions (Signed)
 Continue better diet  Try to get most of your carbohydrates from produce (with the exception of white potatoes) and whole grains Eat less bread/pasta/rice/snack foods/cereals/sweets and other items from the middle of the grocery store (processed carbs)  Keep walking  Add some strength training to your routine, this is important for bone and brain health and can reduce your risk of falls and help your body use insulin properly and regulate weight  Light weights, exercise bands , and internet videos are a good way to start  Yoga (chair or regular), machines , floor exercises or a gym with machines are also good options    Lab today for cholesterol and chemistries   Continue current medicines

## 2023-06-19 NOTE — Assessment & Plan Note (Signed)
 Continues to do better  Plan on continuing fluoxetine 20 mg daily   Encouraged good self care  Has good support

## 2023-06-19 NOTE — Assessment & Plan Note (Addendum)
 Disc goals for lipids and reasons to control them Rev last labs with pt Rev low sat fat diet in detail  Last visit we transitioned from simvastatin to rosuvastatin 5 mg daily  Tolerating well Better diet   Labs today
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
## 2023-06-19 NOTE — Assessment & Plan Note (Signed)
 Lab Results  Component Value Date   HGBA1C 5.6 06/19/2023   HGBA1C 6.1 03/16/2023   HGBA1C 5.7 12/07/2022   Improved Taking metformin 500 mg bid/tolerating well  Cmet today   Encouraged strongly to continue working on low glycemic diet and exercise

## 2023-06-19 NOTE — Assessment & Plan Note (Signed)
 Blood pressure is stable w/o medication currently  bp in fair control at this time  BP Readings from Last 1 Encounters:  06/19/23 130/71   No changes needed Most recent labs reviewed  Disc lifstyle change with low sodium diet and exercise   Cmet today   If this increases would consider arb

## 2023-08-07 ENCOUNTER — Other Ambulatory Visit: Payer: Self-pay | Admitting: Internal Medicine

## 2023-08-07 ENCOUNTER — Other Ambulatory Visit: Payer: Self-pay | Admitting: Family Medicine

## 2023-08-07 NOTE — Telephone Encounter (Signed)
 Please confirm with pt that she wants this before I fill it  I think we tried for vesicare  prior  Thanks

## 2023-08-07 NOTE — Telephone Encounter (Signed)
 Med on med list but as a historical entry. Last filled by PCP on 12/14/21 #90 tabs/ 3 refills  CPE scheduled on 12/19/23

## 2023-08-09 NOTE — Telephone Encounter (Signed)
 Called spoke to daughter on dpr. She is not taking currently but urinary symptoms have slowly increased and would like to start back on something. Would like to go back on either one that your recommend.

## 2023-08-09 NOTE — Telephone Encounter (Signed)
 I sent in vesicare  instead  It may be lower on side effect profile   Side effects can include dry mouth/constipation/ sedation  Let me know if she does not tolerate it or if it does not help  Thanks

## 2023-08-10 NOTE — Telephone Encounter (Signed)
Left VM letting pt's daughter know Dr. Marliss Coots comments

## 2023-08-21 ENCOUNTER — Telehealth: Payer: Self-pay

## 2023-08-21 NOTE — Telephone Encounter (Signed)
 Copied from CRM [id number]. Topic: Clinical - Prescription Issue >> Aug 21, 2023  4:33 PM Felizardo Hotter wrote: Reason for CRM: Pt called would like Dr. Malissa Se to call in some antibiotic and cough medicine to : [id number], [place] - [address] [place] [address] Phone: [phone] Fax: [phone]. Pt did not want to schedule appt. Please call pt at  [phone]

## 2023-08-21 NOTE — Telephone Encounter (Signed)
 What for? Please triage  Will likely need eval/office visit  I cannot prescription without being seen

## 2023-08-22 NOTE — Telephone Encounter (Signed)
 Will see patient then Agree with ER and UC precautions

## 2023-08-22 NOTE — Telephone Encounter (Signed)
 I spoke with Cindy Whitehead; on 08/20/23 started with non prod cough; Cindy Whitehead is still coughing and S/T.no fever, CP or SOB.Cindy Whitehead said she feels some better today. Cindy Whitehead notified as instructed by Dr Malissa Se and Cindy Whitehead voiced understanding,the patient does not have transportation today but scheduled appt with Dr Malissa Se 08/23/23 at 12 noon with UC & ED precautions and Cindy Whitehead voiced understanding., sending note to Dr Malissa Se and [employer].

## 2023-08-22 NOTE — Telephone Encounter (Signed)
 Will route to triage. Looks like prev note says pt refused an appt

## 2023-08-23 ENCOUNTER — Encounter: Payer: Self-pay | Admitting: Family Medicine

## 2023-08-23 ENCOUNTER — Ambulatory Visit: Admitting: Family Medicine

## 2023-08-23 VITALS — BP 125/61 | HR 77 | Temp 98.4°F | Ht <= 58 in | Wt 154.0 lb

## 2023-08-23 DIAGNOSIS — J209 Acute bronchitis, unspecified: Secondary | ICD-10-CM

## 2023-08-23 DIAGNOSIS — J020 Streptococcal pharyngitis: Secondary | ICD-10-CM | POA: Insufficient documentation

## 2023-08-23 DIAGNOSIS — R051 Acute cough: Secondary | ICD-10-CM

## 2023-08-23 DIAGNOSIS — J029 Acute pharyngitis, unspecified: Secondary | ICD-10-CM | POA: Diagnosis not present

## 2023-08-23 LAB — POCT RAPID STREP A (OFFICE): Rapid Strep A Screen: POSITIVE — AB

## 2023-08-23 LAB — POC COVID19 BINAXNOW: SARS Coronavirus 2 Ag: NEGATIVE

## 2023-08-23 MED ORDER — BENZONATATE 200 MG PO CAPS
200.0000 mg | ORAL_CAPSULE | Freq: Three times a day (TID) | ORAL | 1 refills | Status: DC | PRN
Start: 2023-08-23 — End: 2023-10-03

## 2023-08-23 MED ORDER — PREDNISONE 10 MG PO TABS: ORAL_TABLET | ORAL | 0 refills | Status: DC

## 2023-08-23 MED ORDER — AMOXICILLIN-POT CLAVULANATE 875-125 MG PO TABS: 1.0000 | ORAL_TABLET | Freq: Two times a day (BID) | ORAL | 0 refills | Status: DC

## 2023-08-23 NOTE — Assessment & Plan Note (Signed)
 Cough with some mild wheezing / yellow phlegm in 88 yo Also has strep throat  Reassuring exam  Prednisone  30 mg taper prescription =discussed side effects  Augmentin   Tessalon  for cough  Disc symptomatic care - see instructions on AVS Call back and Er precautions noted in detail today

## 2023-08-23 NOTE — Patient Instructions (Signed)
 Take augmentin  for strep throat and it may also help sinus symptoms   Take tessalon  for cough  Robitussin is fine also  Take prednisone  for tight chest /wheezing from bronchitis    Drink fluids and rest  Robitussin DM is good for cough and congestion  Nasal saline for congestion as needed  Tylenol  for fever or pain or headache  Please alert us  if symptoms worsen (if severe or short of breath please go to the ER)
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
## 2023-08-23 NOTE — Assessment & Plan Note (Signed)
 Sore throat in setting of bronchitis also  Positive strep test  Augmentin  prescription   Disc symptomatic care - see instructions on AVS Update if not starting to improve in a week or if worsening  Call back and Er precautions noted in detail today

## 2023-10-02 ENCOUNTER — Other Ambulatory Visit: Payer: Self-pay | Admitting: Family Medicine

## 2023-10-02 ENCOUNTER — Other Ambulatory Visit: Payer: Self-pay | Admitting: Internal Medicine

## 2023-10-02 NOTE — Telephone Encounter (Signed)
Not on current med list, please advise

## 2023-10-02 NOTE — Telephone Encounter (Signed)
 Please verify with pt that she takes this before I refill  Is prn for sleep

## 2023-10-03 MED ORDER — ROSUVASTATIN CALCIUM 5 MG PO TABS: 5.0000 mg | ORAL_TABLET | Freq: Every day | ORAL | 3 refills | Status: AC

## 2023-10-04 DIAGNOSIS — Z809 Family history of malignant neoplasm, unspecified: Secondary | ICD-10-CM | POA: Diagnosis not present

## 2023-10-04 DIAGNOSIS — Z9181 History of falling: Secondary | ICD-10-CM | POA: Diagnosis not present

## 2023-10-04 DIAGNOSIS — R030 Elevated blood-pressure reading, without diagnosis of hypertension: Secondary | ICD-10-CM | POA: Diagnosis not present

## 2023-10-04 DIAGNOSIS — E119 Type 2 diabetes mellitus without complications: Secondary | ICD-10-CM | POA: Diagnosis not present

## 2023-10-04 DIAGNOSIS — Z791 Long term (current) use of non-steroidal anti-inflammatories (NSAID): Secondary | ICD-10-CM | POA: Diagnosis not present

## 2023-10-04 DIAGNOSIS — Z7984 Long term (current) use of oral hypoglycemic drugs: Secondary | ICD-10-CM | POA: Diagnosis not present

## 2023-10-04 DIAGNOSIS — E785 Hyperlipidemia, unspecified: Secondary | ICD-10-CM | POA: Diagnosis not present

## 2023-10-04 DIAGNOSIS — N3941 Urge incontinence: Secondary | ICD-10-CM | POA: Diagnosis not present

## 2023-10-04 DIAGNOSIS — R269 Unspecified abnormalities of gait and mobility: Secondary | ICD-10-CM | POA: Diagnosis not present

## 2023-10-04 DIAGNOSIS — Z6831 Body mass index (BMI) 31.0-31.9, adult: Secondary | ICD-10-CM | POA: Diagnosis not present

## 2023-10-04 DIAGNOSIS — E669 Obesity, unspecified: Secondary | ICD-10-CM | POA: Diagnosis not present

## 2023-12-02 ENCOUNTER — Other Ambulatory Visit: Payer: Self-pay | Admitting: Family Medicine

## 2023-12-04 NOTE — Telephone Encounter (Signed)
 Med on list as a historical entry. CPE scheduled on 12/19/23

## 2023-12-04 NOTE — Telephone Encounter (Signed)
 What is she taking it for ?  Was not from us  the last time  Thanks

## 2023-12-05 NOTE — Telephone Encounter (Signed)
 Left VM requesting pt to call the office back

## 2023-12-06 NOTE — Telephone Encounter (Signed)
 Left voicemail for patient to return call to office.

## 2023-12-08 NOTE — Telephone Encounter (Signed)
 Unable to reach patient. Left voicemail to return call to our office.

## 2023-12-08 NOTE — Telephone Encounter (Signed)
 Vesicare  has refills on file and med declined

## 2023-12-08 NOTE — Telephone Encounter (Signed)
 Copied from CRM #[id number]. Topic: Clinical - Medication Question >> Dec 08, 2023  1:02 PM Burnard DEL wrote: Reason for CRM: Patient returned call to [facility] regarding medication request. Patient stated that she didn't request to get a refill on naproxen  medication.She stated that she requested to have a refill on solifenacin  (VESICARE ) 10 MG tablet,her bladder medication.  [id number], [place] - [address]  Phone: [phone] Fax: [phone]

## 2023-12-10 ENCOUNTER — Telehealth: Payer: Self-pay | Admitting: Family Medicine

## 2023-12-10 DIAGNOSIS — E7800 Pure hypercholesterolemia, unspecified: Secondary | ICD-10-CM

## 2023-12-10 DIAGNOSIS — I10 Essential (primary) hypertension: Secondary | ICD-10-CM

## 2023-12-10 DIAGNOSIS — R7303 Prediabetes: Secondary | ICD-10-CM

## 2023-12-10 DIAGNOSIS — D508 Other iron deficiency anemias: Secondary | ICD-10-CM

## 2023-12-10 DIAGNOSIS — E559 Vitamin D deficiency, unspecified: Secondary | ICD-10-CM

## 2023-12-10 NOTE — Telephone Encounter (Signed)
-----   Message from Veva JINNY Ferrari sent at 11/30/2023  2:47 PM EDT ----- Regarding: Lab orders for Tue, 10.7.25 Patient is scheduled for CPX labs, please order future labs, Thanks , Veva

## 2023-12-12 ENCOUNTER — Ambulatory Visit: Payer: Self-pay | Admitting: Family Medicine

## 2023-12-12 ENCOUNTER — Other Ambulatory Visit

## 2023-12-12 DIAGNOSIS — I10 Essential (primary) hypertension: Secondary | ICD-10-CM | POA: Diagnosis not present

## 2023-12-12 DIAGNOSIS — R7303 Prediabetes: Secondary | ICD-10-CM

## 2023-12-12 DIAGNOSIS — E559 Vitamin D deficiency, unspecified: Secondary | ICD-10-CM

## 2023-12-12 DIAGNOSIS — E7800 Pure hypercholesterolemia, unspecified: Secondary | ICD-10-CM | POA: Diagnosis not present

## 2023-12-12 LAB — CBC WITH DIFFERENTIAL/PLATELET
Basophils Absolute: 0.1 K/uL (ref 0.0–0.1)
Basophils Relative: 1.1 % (ref 0.0–3.0)
Eosinophils Absolute: 0.3 K/uL (ref 0.0–0.7)
Eosinophils Relative: 4.3 % (ref 0.0–5.0)
HCT: 40.6 % (ref 36.0–46.0)
Hemoglobin: 13.6 g/dL (ref 12.0–15.0)
Lymphocytes Relative: 29.5 % (ref 12.0–46.0)
Lymphs Abs: 1.8 K/uL (ref 0.7–4.0)
MCHC: 33.4 g/dL (ref 30.0–36.0)
MCV: 96.5 fl (ref 78.0–100.0)
Monocytes Absolute: 0.4 K/uL (ref 0.1–1.0)
Monocytes Relative: 6.8 % (ref 3.0–12.0)
Neutro Abs: 3.6 K/uL (ref 1.4–7.7)
Neutrophils Relative %: 58.3 % (ref 43.0–77.0)
Platelets: 189 K/uL (ref 150.0–400.0)
RBC: 4.21 Mil/uL (ref 3.87–5.11)
RDW: 12.9 % (ref 11.5–15.5)
WBC: 6.1 K/uL (ref 4.0–10.5)

## 2023-12-12 LAB — LIPID PANEL
Cholesterol: 125 mg/dL (ref 0–200)
HDL: 51.2 mg/dL (ref >39.00)
LDL Cholesterol: 47 mg/dL (ref 0–99)
NonHDL: 73.5
Total CHOL/HDL Ratio: 2
Triglycerides: 131 mg/dL (ref 0.0–149.0)
VLDL: 26.2 mg/dL (ref 0.0–40.0)

## 2023-12-12 LAB — HEMOGLOBIN A1C: Hgb A1c MFr Bld: 5.6 % (ref 4.6–6.5)

## 2023-12-12 LAB — VITAMIN D 25 HYDROXY (VIT D DEFICIENCY, FRACTURES): VITD: 23.07 ng/mL — ABNORMAL LOW (ref 30.00–100.00)

## 2023-12-12 LAB — TSH: TSH: 2.27 u[IU]/mL (ref 0.35–5.50)

## 2023-12-12 LAB — COMPREHENSIVE METABOLIC PANEL WITH GFR
ALT: 18 U/L (ref 0–35)
AST: 29 U/L (ref 0–37)
Albumin: 3.9 g/dL (ref 3.5–5.2)
Alkaline Phosphatase: 69 U/L (ref 39–117)
BUN: 19 mg/dL (ref 6–23)
CO2: 27 meq/L (ref 19–32)
Calcium: 9.4 mg/dL (ref 8.4–10.5)
Chloride: 106 meq/L (ref 96–112)
Creatinine, Ser: 0.73 mg/dL (ref 0.40–1.20)
GFR: 74.62 mL/min (ref >60.00)
Glucose, Bld: 96 mg/dL (ref 70–99)
Potassium: 4.5 meq/L (ref 3.5–5.1)
Sodium: 141 meq/L (ref 135–145)
Total Bilirubin: 0.8 mg/dL (ref 0.2–1.2)
Total Protein: 6.4 g/dL (ref 6.0–8.3)

## 2023-12-19 ENCOUNTER — Ambulatory Visit: Admitting: Family Medicine

## 2023-12-19 ENCOUNTER — Encounter: Payer: Self-pay | Admitting: Family Medicine

## 2023-12-19 VITALS — BP 128/68 | HR 69 | Temp 97.8°F | Ht <= 58 in | Wt 155.4 lb

## 2023-12-19 DIAGNOSIS — N3941 Urge incontinence: Secondary | ICD-10-CM | POA: Diagnosis not present

## 2023-12-19 DIAGNOSIS — F4323 Adjustment disorder with mixed anxiety and depressed mood: Secondary | ICD-10-CM

## 2023-12-19 DIAGNOSIS — Z23 Encounter for immunization: Secondary | ICD-10-CM

## 2023-12-19 DIAGNOSIS — R7303 Prediabetes: Secondary | ICD-10-CM

## 2023-12-19 DIAGNOSIS — E7800 Pure hypercholesterolemia, unspecified: Secondary | ICD-10-CM | POA: Diagnosis not present

## 2023-12-19 DIAGNOSIS — Z0000 Encounter for general adult medical examination without abnormal findings: Secondary | ICD-10-CM

## 2023-12-19 DIAGNOSIS — E559 Vitamin D deficiency, unspecified: Secondary | ICD-10-CM

## 2023-12-19 DIAGNOSIS — E66811 Obesity, class 1: Secondary | ICD-10-CM | POA: Diagnosis not present

## 2023-12-19 DIAGNOSIS — I10 Essential (primary) hypertension: Secondary | ICD-10-CM

## 2023-12-19 DIAGNOSIS — M8589 Other specified disorders of bone density and structure, multiple sites: Secondary | ICD-10-CM | POA: Diagnosis not present

## 2023-12-19 NOTE — Assessment & Plan Note (Signed)
 Discussed how this problem influences overall health and the risks it imposes  Reviewed plan for weight loss with lower calorie diet (via better food choices (lower glycemic and portion control) along with exercise building up to or more than 30 minutes 5 days per week including some aerobic activity and strength training

## 2023-12-19 NOTE — Assessment & Plan Note (Signed)
 Disc goals for lipids and reasons to control them Rev last labs with pt Rev low sat fat diet in detail Well controlled with rosuvastatin  5 mg daily  LDL of 47
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
## 2023-12-19 NOTE — Assessment & Plan Note (Signed)
 PHQ 1 Fluoxetine  has helped mood and grief reaction Plan to continue it  Encouraged good self care

## 2023-12-19 NOTE — Assessment & Plan Note (Signed)
 Reviewed health habits including diet and exercise and skin cancer prevention Reviewed appropriate screening tests for age  Also reviewed health mt list, fam hx and immunization status , as well as social and family history   See HPI Labs reviewed and ordered Health Maintenance  Topic Date Due   Eye exam for diabetics  12/17/2022   Medicare Annual Wellness Visit  11/22/2023   Breast Cancer Screening  03/20/2024*   Zoster (Shingles) Vaccine (2 of 2) 03/20/2025*   COVID-19 Vaccine (6 - 2025-26 season) 01/03/2026*   Hemoglobin A1C  06/11/2024   DTaP/Tdap/Td vaccine (3 - Tdap) 10/18/2026   Pneumococcal Vaccine for age over 36  Completed   Flu Shot  Completed   DEXA scan (bone density measurement)  Completed   Meningitis B Vaccine  Aged Out   Complete foot exam   Discontinued  *Topic was postponed. The date shown is not the original due date.    Encouraged to get shingrix booster at pharmacy  Flu shot given  Encouraged to schedule mammo and dexa at gyn office and followup there Recall colonoscopy not recommended due to age  Discussed fall prevention, supplements and exercise for bone density  PHQ 1 with treatment

## 2023-12-19 NOTE — Assessment & Plan Note (Signed)
Continues vesicare 10 mg daily

## 2023-12-19 NOTE — Patient Instructions (Addendum)
 You are due for the 2nd shingles vaccine Check with the pharmacy   Flu shot here today   You are due for mammogram, bone density  Call [facility] office to set up your annual visit with them and get those done   Add 2000 international units of vitamin D3 to what you are taking over the counter   Aim for at least 30 minutes of exercise (not just regular activity) daily  Add some strength training to your routine, this is important for bone and brain health and can reduce your risk of falls and help your body use insulin  properly and regulate weight  Light weights, exercise bands , and internet videos are a good way to start  Yoga (chair or regular), machines , floor exercises or a gym with machines are also good options   Labs look stable

## 2023-12-19 NOTE — Assessment & Plan Note (Signed)
 Last vitamin D  Lab Results  Component Value Date   VD25OH 23.07 (L) 12/12/2023    Will increase D3 by 2000 international units daily in addition to her ca plus D

## 2023-12-19 NOTE — Assessment & Plan Note (Signed)
 bp in fair control at this time  BP Readings from Last 1 Encounters:  12/19/23 128/68   No changes needed/ no medication currently  Most recent labs reviewed  Disc lifstyle change with low sodium diet and exercise

## 2023-12-19 NOTE — Assessment & Plan Note (Addendum)
 Prediabetes  Lab Results  Component Value Date   HGBA1C 5.6 12/12/2023   HGBA1C 5.6 06/19/2023   HGBA1C 6.1 03/16/2023    disc imp of low glycemic diet and wt loss to prevent DM2  Continues metformin  500 mg bid

## 2023-12-19 NOTE — Assessment & Plan Note (Signed)
 Encouraged strongly to schedule dexa with gyn provider No fractures  One fall  Discussed fall prevention   Instructed to increase D3 by 2000 international units more daily   Discussed fall prevention, supplements and exercise for bone density

## 2024-01-03 ENCOUNTER — Ambulatory Visit: Payer: Self-pay

## 2024-01-03 NOTE — Telephone Encounter (Signed)
 FYI Only or Action Required?: FYI only for provider: appointment scheduled on 01/04/24.  Patient was last seen in primary care on 12/19/2023 by Randeen Laine LABOR, MD.  Called Nurse Triage reporting Hand Injury.  Symptoms began a week ago.  Interventions attempted: Rest, hydration, or home remedies and Ice/heat application.  Symptoms are: gradually improving.  Triage Disposition: See Physician Within 24 Hours  Patient/caregiver understands and will follow disposition?: Yes   Copied from CRM #[id number]. Topic: Clinical - Red Word Triage >> Jan 03, 2024  5:11 PM Cindy Whitehead wrote: Red Word that prompted transfer to Nurse Triage: Pt fell about 2 weeks ago and still has swelling in her hands. She tried using ice packs but it didn't help. Warm transfer to NT. Reason for Disposition  [1] MODERATE pain (e.g., interferes with normal activities) AND [2] high-risk adult (e.g., age > 60 years, osteoporosis, chronic steroid use)  Answer Assessment - Initial Assessment Questions Additional info: Patient states about 2 weeks ago while walking home from her daughters house she fell on unlevel grassy area, she landed on her left side including her face. Her son in law saw her and immediately assisted her. She states she did not lose consciousness, she sustain a cut on her lip which has healed but still slightly tender with mild swelling, she feels she may have a loose tooth and will be calling the dentist. After the fall she had hand pain and swelling, she has been treating at home with ice, rest. Her pain is improving but not resolved, she is having difficulty grasping objects. She never sought evaluation after fall.   1. MECHANISM: How did the injury happen?     Tripped while walking from daughters home, unlevel ground. Fell on left side. Lip was cut but now healed but has swelling.  2. ONSET: When did the injury happen? (Minutes or hours ago)      Two weeks ago  3. APPEARANCE of INJURY: What does the  injury look like?      Swelling in hands 4. SEVERITY: Can you use the hand normally? Can you bend your fingers into a ball and then fully open them?     Left hand swelling, difficulty picking up objects with weight.  5. SIZE: For cuts, bruises, or swelling, ask: How large is it? (e.g., inches or centimeters;  entire hand or wrist)       6. PAIN: Is there pain? If Yes, ask: How bad is the pain?  (Scale 1-10; or mild, moderate, severe)     moderate 7. TETANUS: For any breaks in the skin, ask: When was the last tetanus booster?      8. OTHER SYMPTOMS: Do you have any other symptoms?      Denies dizziness, headache, chest pain, breathing difficulty, tingling, numbness, recurring falls, and all other symptoms.  Protocols used: Hand and Wrist Injury-A-AH

## 2024-01-04 ENCOUNTER — Encounter: Payer: Self-pay | Admitting: Family Medicine

## 2024-01-04 ENCOUNTER — Ambulatory Visit
Admission: RE | Admit: 2024-01-04 | Discharge: 2024-01-04 | Disposition: A | Source: Ambulatory Visit | Attending: Family Medicine | Admitting: Family Medicine

## 2024-01-04 ENCOUNTER — Ambulatory Visit: Admitting: Family Medicine

## 2024-01-04 VITALS — BP 122/80 | HR 85 | Temp 98.0°F | Ht <= 58 in | Wt 153.5 lb

## 2024-01-04 DIAGNOSIS — M8580 Other specified disorders of bone density and structure, unspecified site: Secondary | ICD-10-CM | POA: Diagnosis not present

## 2024-01-04 DIAGNOSIS — M19042 Primary osteoarthritis, left hand: Secondary | ICD-10-CM | POA: Diagnosis not present

## 2024-01-04 DIAGNOSIS — R296 Repeated falls: Secondary | ICD-10-CM | POA: Diagnosis not present

## 2024-01-04 DIAGNOSIS — M25532 Pain in left wrist: Secondary | ICD-10-CM | POA: Diagnosis not present

## 2024-01-04 DIAGNOSIS — M79642 Pain in left hand: Secondary | ICD-10-CM

## 2024-01-04 DIAGNOSIS — M1812 Unilateral primary osteoarthritis of first carpometacarpal joint, left hand: Secondary | ICD-10-CM | POA: Diagnosis not present

## 2024-01-04 NOTE — Patient Instructions (Signed)
 Try using an OTC wrist brace and update us  as needed.  Take care.  Glad to see you. You should get a call about PT at home.

## 2024-01-04 NOTE — Telephone Encounter (Signed)
 Aware, will watch for correspondence Thanks for seeing her  Agree with UC and ER precautions

## 2024-01-04 NOTE — Progress Notes (Unsigned)
 She fell about 2 weeks ago.  She didn't have h/o syncope- she was on unlevel ground.  She fell about 2 weeks prior to the event above, on her back deck.  She called for help via cell phone.  Neighbor helped.  She lives with a roommate who is not home frequently.  She spends most of her time at her daughter's house during the day. D/w pt about fall prevention, using a cane, PT, etc.   Meds, vitals, and allergies reviewed.   ROS: Per HPI unless specifically indicated in ROS section   Nad Ncat Pain along L 5th metacarpal.  Pain with grip.  Pain with L wrist flexion and ext. Normal L elbow ROM.   Rrr Ctab Distally NV intact LUE and normal L elbow ROM.

## 2024-01-04 NOTE — Assessment & Plan Note (Signed)
Refer for HH PT. 

## 2024-01-04 NOTE — Telephone Encounter (Signed)
Will see at OV 

## 2024-01-05 DIAGNOSIS — M79642 Pain in left hand: Secondary | ICD-10-CM | POA: Insufficient documentation

## 2024-01-05 NOTE — Assessment & Plan Note (Signed)
 No fx seen on my read of xray, d/w pt.  She can try using an OTC wrist brace and update us  as needed.

## 2024-01-08 ENCOUNTER — Ambulatory Visit: Payer: Self-pay | Admitting: Family Medicine

## 2024-01-08 DIAGNOSIS — M25532 Pain in left wrist: Secondary | ICD-10-CM

## 2024-01-20 DIAGNOSIS — N3941 Urge incontinence: Secondary | ICD-10-CM | POA: Diagnosis not present

## 2024-01-20 DIAGNOSIS — E1136 Type 2 diabetes mellitus with diabetic cataract: Secondary | ICD-10-CM | POA: Diagnosis not present

## 2024-01-20 DIAGNOSIS — D649 Anemia, unspecified: Secondary | ICD-10-CM | POA: Diagnosis not present

## 2024-01-20 DIAGNOSIS — Z9181 History of falling: Secondary | ICD-10-CM | POA: Diagnosis not present

## 2024-01-20 DIAGNOSIS — Z87442 Personal history of urinary calculi: Secondary | ICD-10-CM | POA: Diagnosis not present

## 2024-01-20 DIAGNOSIS — M8580 Other specified disorders of bone density and structure, unspecified site: Secondary | ICD-10-CM | POA: Diagnosis not present

## 2024-01-20 DIAGNOSIS — E559 Vitamin D deficiency, unspecified: Secondary | ICD-10-CM | POA: Diagnosis not present

## 2024-01-20 DIAGNOSIS — E7800 Pure hypercholesterolemia, unspecified: Secondary | ICD-10-CM | POA: Diagnosis not present

## 2024-01-20 DIAGNOSIS — Z6834 Body mass index (BMI) 34.0-34.9, adult: Secondary | ICD-10-CM | POA: Diagnosis not present

## 2024-01-20 DIAGNOSIS — E669 Obesity, unspecified: Secondary | ICD-10-CM | POA: Diagnosis not present

## 2024-01-20 DIAGNOSIS — H9319 Tinnitus, unspecified ear: Secondary | ICD-10-CM | POA: Diagnosis not present

## 2024-01-20 DIAGNOSIS — J45909 Unspecified asthma, uncomplicated: Secondary | ICD-10-CM | POA: Diagnosis not present

## 2024-01-20 DIAGNOSIS — I10 Essential (primary) hypertension: Secondary | ICD-10-CM | POA: Diagnosis not present

## 2024-01-20 DIAGNOSIS — K219 Gastro-esophageal reflux disease without esophagitis: Secondary | ICD-10-CM | POA: Diagnosis not present

## 2024-01-20 DIAGNOSIS — Z556 Problems related to health literacy: Secondary | ICD-10-CM | POA: Diagnosis not present

## 2024-01-20 DIAGNOSIS — F418 Other specified anxiety disorders: Secondary | ICD-10-CM | POA: Diagnosis not present

## 2024-01-20 DIAGNOSIS — Z7984 Long term (current) use of oral hypoglycemic drugs: Secondary | ICD-10-CM | POA: Diagnosis not present

## 2024-01-20 DIAGNOSIS — G8929 Other chronic pain: Secondary | ICD-10-CM | POA: Diagnosis not present

## 2024-01-21 ENCOUNTER — Telehealth: Payer: Self-pay | Admitting: Family Medicine

## 2024-01-21 NOTE — Telephone Encounter (Signed)
 Please contact this patient about her ortho referral.  Thanks.

## 2024-01-24 ENCOUNTER — Encounter: Payer: Self-pay | Admitting: *Deleted

## 2024-01-24 DIAGNOSIS — I10 Essential (primary) hypertension: Secondary | ICD-10-CM | POA: Diagnosis not present

## 2024-01-24 DIAGNOSIS — K219 Gastro-esophageal reflux disease without esophagitis: Secondary | ICD-10-CM | POA: Diagnosis not present

## 2024-01-24 DIAGNOSIS — H9319 Tinnitus, unspecified ear: Secondary | ICD-10-CM | POA: Diagnosis not present

## 2024-01-24 DIAGNOSIS — Z556 Problems related to health literacy: Secondary | ICD-10-CM | POA: Diagnosis not present

## 2024-01-24 DIAGNOSIS — J45909 Unspecified asthma, uncomplicated: Secondary | ICD-10-CM | POA: Diagnosis not present

## 2024-01-24 DIAGNOSIS — E559 Vitamin D deficiency, unspecified: Secondary | ICD-10-CM | POA: Diagnosis not present

## 2024-01-24 DIAGNOSIS — F418 Other specified anxiety disorders: Secondary | ICD-10-CM | POA: Diagnosis not present

## 2024-01-24 DIAGNOSIS — D649 Anemia, unspecified: Secondary | ICD-10-CM | POA: Diagnosis not present

## 2024-01-24 DIAGNOSIS — E1136 Type 2 diabetes mellitus with diabetic cataract: Secondary | ICD-10-CM | POA: Diagnosis not present

## 2024-01-24 DIAGNOSIS — Z6834 Body mass index (BMI) 34.0-34.9, adult: Secondary | ICD-10-CM | POA: Diagnosis not present

## 2024-01-24 DIAGNOSIS — E7800 Pure hypercholesterolemia, unspecified: Secondary | ICD-10-CM | POA: Diagnosis not present

## 2024-01-24 DIAGNOSIS — Z87442 Personal history of urinary calculi: Secondary | ICD-10-CM | POA: Diagnosis not present

## 2024-01-24 DIAGNOSIS — Z9181 History of falling: Secondary | ICD-10-CM | POA: Diagnosis not present

## 2024-01-24 DIAGNOSIS — E669 Obesity, unspecified: Secondary | ICD-10-CM | POA: Diagnosis not present

## 2024-01-24 DIAGNOSIS — N3941 Urge incontinence: Secondary | ICD-10-CM | POA: Diagnosis not present

## 2024-01-24 DIAGNOSIS — M8580 Other specified disorders of bone density and structure, unspecified site: Secondary | ICD-10-CM | POA: Diagnosis not present

## 2024-01-24 DIAGNOSIS — Z7984 Long term (current) use of oral hypoglycemic drugs: Secondary | ICD-10-CM | POA: Diagnosis not present

## 2024-01-24 DIAGNOSIS — G8929 Other chronic pain: Secondary | ICD-10-CM | POA: Diagnosis not present

## 2024-01-24 NOTE — Telephone Encounter (Signed)
 Referral faxed to [facility] [place] location.   Patient notified via MyChart letter and [facility] message.

## 2024-02-07 DIAGNOSIS — Z556 Problems related to health literacy: Secondary | ICD-10-CM | POA: Diagnosis not present

## 2024-02-07 DIAGNOSIS — N3941 Urge incontinence: Secondary | ICD-10-CM | POA: Diagnosis not present

## 2024-02-07 DIAGNOSIS — Z87442 Personal history of urinary calculi: Secondary | ICD-10-CM | POA: Diagnosis not present

## 2024-02-07 DIAGNOSIS — G8929 Other chronic pain: Secondary | ICD-10-CM | POA: Diagnosis not present

## 2024-02-07 DIAGNOSIS — E7800 Pure hypercholesterolemia, unspecified: Secondary | ICD-10-CM | POA: Diagnosis not present

## 2024-02-07 DIAGNOSIS — Z7984 Long term (current) use of oral hypoglycemic drugs: Secondary | ICD-10-CM | POA: Diagnosis not present

## 2024-02-07 DIAGNOSIS — K219 Gastro-esophageal reflux disease without esophagitis: Secondary | ICD-10-CM | POA: Diagnosis not present

## 2024-02-07 DIAGNOSIS — I10 Essential (primary) hypertension: Secondary | ICD-10-CM | POA: Diagnosis not present

## 2024-02-07 DIAGNOSIS — H9319 Tinnitus, unspecified ear: Secondary | ICD-10-CM | POA: Diagnosis not present

## 2024-02-07 DIAGNOSIS — J45909 Unspecified asthma, uncomplicated: Secondary | ICD-10-CM | POA: Diagnosis not present

## 2024-02-07 DIAGNOSIS — E1136 Type 2 diabetes mellitus with diabetic cataract: Secondary | ICD-10-CM | POA: Diagnosis not present

## 2024-02-07 DIAGNOSIS — Z6834 Body mass index (BMI) 34.0-34.9, adult: Secondary | ICD-10-CM | POA: Diagnosis not present

## 2024-02-07 DIAGNOSIS — E669 Obesity, unspecified: Secondary | ICD-10-CM | POA: Diagnosis not present

## 2024-02-07 DIAGNOSIS — Z9181 History of falling: Secondary | ICD-10-CM | POA: Diagnosis not present

## 2024-02-07 DIAGNOSIS — F418 Other specified anxiety disorders: Secondary | ICD-10-CM | POA: Diagnosis not present

## 2024-02-07 DIAGNOSIS — M8580 Other specified disorders of bone density and structure, unspecified site: Secondary | ICD-10-CM | POA: Diagnosis not present

## 2024-02-07 DIAGNOSIS — D649 Anemia, unspecified: Secondary | ICD-10-CM | POA: Diagnosis not present

## 2024-02-07 DIAGNOSIS — E559 Vitamin D deficiency, unspecified: Secondary | ICD-10-CM | POA: Diagnosis not present

## 2024-02-20 DIAGNOSIS — F418 Other specified anxiety disorders: Secondary | ICD-10-CM | POA: Diagnosis not present

## 2024-02-20 DIAGNOSIS — K219 Gastro-esophageal reflux disease without esophagitis: Secondary | ICD-10-CM | POA: Diagnosis not present

## 2024-02-20 DIAGNOSIS — M8580 Other specified disorders of bone density and structure, unspecified site: Secondary | ICD-10-CM | POA: Diagnosis not present

## 2024-02-20 DIAGNOSIS — G8929 Other chronic pain: Secondary | ICD-10-CM | POA: Diagnosis not present

## 2024-02-20 DIAGNOSIS — E669 Obesity, unspecified: Secondary | ICD-10-CM | POA: Diagnosis not present

## 2024-02-20 DIAGNOSIS — E559 Vitamin D deficiency, unspecified: Secondary | ICD-10-CM | POA: Diagnosis not present

## 2024-02-20 DIAGNOSIS — Z6834 Body mass index (BMI) 34.0-34.9, adult: Secondary | ICD-10-CM | POA: Diagnosis not present

## 2024-02-20 DIAGNOSIS — N3941 Urge incontinence: Secondary | ICD-10-CM | POA: Diagnosis not present

## 2024-02-20 DIAGNOSIS — D649 Anemia, unspecified: Secondary | ICD-10-CM | POA: Diagnosis not present

## 2024-02-20 DIAGNOSIS — E7800 Pure hypercholesterolemia, unspecified: Secondary | ICD-10-CM | POA: Diagnosis not present

## 2024-02-20 DIAGNOSIS — Z87442 Personal history of urinary calculi: Secondary | ICD-10-CM | POA: Diagnosis not present

## 2024-02-20 DIAGNOSIS — Z9181 History of falling: Secondary | ICD-10-CM | POA: Diagnosis not present

## 2024-02-20 DIAGNOSIS — Z556 Problems related to health literacy: Secondary | ICD-10-CM | POA: Diagnosis not present

## 2024-02-20 DIAGNOSIS — J45909 Unspecified asthma, uncomplicated: Secondary | ICD-10-CM | POA: Diagnosis not present

## 2024-02-20 DIAGNOSIS — H9319 Tinnitus, unspecified ear: Secondary | ICD-10-CM | POA: Diagnosis not present

## 2024-02-20 DIAGNOSIS — Z7984 Long term (current) use of oral hypoglycemic drugs: Secondary | ICD-10-CM | POA: Diagnosis not present

## 2024-02-20 DIAGNOSIS — E1136 Type 2 diabetes mellitus with diabetic cataract: Secondary | ICD-10-CM | POA: Diagnosis not present

## 2024-02-20 DIAGNOSIS — I10 Essential (primary) hypertension: Secondary | ICD-10-CM | POA: Diagnosis not present

## 2024-02-22 ENCOUNTER — Other Ambulatory Visit: Payer: Self-pay | Admitting: Family Medicine

## 2024-02-22 DIAGNOSIS — G8929 Other chronic pain: Secondary | ICD-10-CM | POA: Diagnosis not present

## 2024-02-22 DIAGNOSIS — Z556 Problems related to health literacy: Secondary | ICD-10-CM | POA: Diagnosis not present

## 2024-02-22 DIAGNOSIS — Z9181 History of falling: Secondary | ICD-10-CM | POA: Diagnosis not present

## 2024-02-22 DIAGNOSIS — H9319 Tinnitus, unspecified ear: Secondary | ICD-10-CM | POA: Diagnosis not present

## 2024-02-22 DIAGNOSIS — J45909 Unspecified asthma, uncomplicated: Secondary | ICD-10-CM | POA: Diagnosis not present

## 2024-02-22 DIAGNOSIS — M8580 Other specified disorders of bone density and structure, unspecified site: Secondary | ICD-10-CM | POA: Diagnosis not present

## 2024-02-22 DIAGNOSIS — I10 Essential (primary) hypertension: Secondary | ICD-10-CM | POA: Diagnosis not present

## 2024-02-22 DIAGNOSIS — F418 Other specified anxiety disorders: Secondary | ICD-10-CM | POA: Diagnosis not present

## 2024-02-22 DIAGNOSIS — E559 Vitamin D deficiency, unspecified: Secondary | ICD-10-CM | POA: Diagnosis not present

## 2024-02-22 DIAGNOSIS — N3941 Urge incontinence: Secondary | ICD-10-CM | POA: Diagnosis not present

## 2024-02-22 DIAGNOSIS — E669 Obesity, unspecified: Secondary | ICD-10-CM | POA: Diagnosis not present

## 2024-02-22 DIAGNOSIS — Z6834 Body mass index (BMI) 34.0-34.9, adult: Secondary | ICD-10-CM | POA: Diagnosis not present

## 2024-02-22 DIAGNOSIS — E1136 Type 2 diabetes mellitus with diabetic cataract: Secondary | ICD-10-CM | POA: Diagnosis not present

## 2024-02-22 DIAGNOSIS — Z87442 Personal history of urinary calculi: Secondary | ICD-10-CM | POA: Diagnosis not present

## 2024-02-22 DIAGNOSIS — Z7984 Long term (current) use of oral hypoglycemic drugs: Secondary | ICD-10-CM | POA: Diagnosis not present

## 2024-02-22 DIAGNOSIS — E7800 Pure hypercholesterolemia, unspecified: Secondary | ICD-10-CM | POA: Diagnosis not present

## 2024-02-22 DIAGNOSIS — D649 Anemia, unspecified: Secondary | ICD-10-CM | POA: Diagnosis not present

## 2024-02-22 DIAGNOSIS — K219 Gastro-esophageal reflux disease without esophagitis: Secondary | ICD-10-CM | POA: Diagnosis not present

## 2024-03-06 ENCOUNTER — Other Ambulatory Visit: Payer: Self-pay | Admitting: Family Medicine
